# Patient Record
Sex: Male | Born: 1935 | Race: White | Hispanic: No | Marital: Married | Smoking: Never smoker
Health system: Southern US, Community
[De-identification: ages and names within clinical notes are randomized; demographics above are authoritative.]

## PROBLEM LIST (undated history)

## (undated) DIAGNOSIS — I10 Essential (primary) hypertension: Secondary | ICD-10-CM

## (undated) DIAGNOSIS — E785 Hyperlipidemia, unspecified: Secondary | ICD-10-CM

## (undated) DIAGNOSIS — I340 Nonrheumatic mitral (valve) insufficiency: Secondary | ICD-10-CM

## (undated) DIAGNOSIS — I2510 Atherosclerotic heart disease of native coronary artery without angina pectoris: Secondary | ICD-10-CM

## (undated) DIAGNOSIS — I639 Cerebral infarction, unspecified: Secondary | ICD-10-CM

## (undated) DIAGNOSIS — T7840XA Allergy, unspecified, initial encounter: Secondary | ICD-10-CM

## (undated) HISTORY — PX: APPENDECTOMY: SHX54

## (undated) HISTORY — PX: CORONARY ARTERY BYPASS GRAFT: SHX141

## (undated) HISTORY — DX: Allergy, unspecified, initial encounter: T78.40XA

## (undated) HISTORY — PX: SPINE SURGERY: SHX786

## (undated) HISTORY — DX: Cerebral infarction, unspecified: I63.9

## (undated) HISTORY — PX: BACK SURGERY: SHX140

## (undated) HISTORY — PX: OTHER SURGICAL HISTORY: SHX169

---

## 2005-05-12 ENCOUNTER — Encounter: Admission: RE | Admit: 2005-05-12 | Discharge: 2005-05-12 | Payer: Self-pay | Admitting: Orthopedic Surgery

## 2005-06-02 ENCOUNTER — Encounter: Admission: RE | Admit: 2005-06-02 | Discharge: 2005-06-02 | Payer: Self-pay | Admitting: Orthopedic Surgery

## 2005-07-10 ENCOUNTER — Ambulatory Visit: Payer: Self-pay | Admitting: Unknown Physician Specialty

## 2005-10-06 ENCOUNTER — Ambulatory Visit: Payer: Self-pay | Admitting: Pain Medicine

## 2006-02-10 ENCOUNTER — Inpatient Hospital Stay (HOSPITAL_COMMUNITY): Admission: RE | Admit: 2006-02-10 | Discharge: 2006-02-15 | Payer: Self-pay | Admitting: Neurosurgery

## 2006-02-11 ENCOUNTER — Ambulatory Visit: Payer: Self-pay | Admitting: Physical Medicine & Rehabilitation

## 2010-03-14 ENCOUNTER — Ambulatory Visit: Payer: Self-pay

## 2010-04-18 ENCOUNTER — Ambulatory Visit: Payer: Self-pay

## 2010-04-29 ENCOUNTER — Encounter: Admission: RE | Admit: 2010-04-29 | Discharge: 2010-04-29 | Payer: Self-pay | Admitting: Sports Medicine

## 2010-05-13 ENCOUNTER — Encounter: Admission: RE | Admit: 2010-05-13 | Discharge: 2010-05-13 | Payer: Self-pay | Admitting: Sports Medicine

## 2010-11-24 ENCOUNTER — Encounter: Payer: Self-pay | Admitting: Orthopedic Surgery

## 2011-03-21 NOTE — Discharge Summary (Signed)
Charles Tapia, Charles Tapia                 ACCOUNT NO.:  [id number]   MEDICAL RECORD NO.:  [id number]          PATIENT TYPE:  INP   LOCATION:  3039                         FACILITY:  [facility]   PHYSICIAN:  Hewitt Shorts, M.D.DATE OF BIRTH:  October 14, 1918   DATE OF ADMISSION:  02/10/2006  DATE OF DISCHARGE:  02/15/2006                                 DISCHARGE SUMMARY   The patient is admitted by Dr. Jeral Fruit for treatment of degenerative changes  in the low back.  Dr. Jeral Fruit notes an unremarkable general examination and  said he had a positive straight leg raising test.  Further details of his  admission history and physical examination were included in Dr. Cassandria Santee  admission note.   HOSPITAL COURSE:  The patient was admitted and underwent lumbar laminectomy  and decompression and posterolateral arthrodesis.  Following surgery, he has  had difficulty with urinary retention.  A Foley catheter was placed.  Dr.  Jeral Fruit obtained a consultation with Dr. Gaynelle Arabian who recommended leaving  the Foley in place, continuing him on Flomax and have him come in to Dr.  Earlene Plater' office in about five days for a voiding trial.  The patient's wound  itself has healed well and we are discharging the patient to home with  prescription for Percocet 1-2 tablets p.o. q.4-6h. p.r.n. pain, 50 tablets  prescribed with no refills and Flomax 0.4 mg daily, 30 tablets prescribed  with one refill.  He is to follow up with Dr. Jeral Fruit in about three weeks.  Follow up with Dr. Earlene Plater within four to five days.   DISCHARGE DIAGNOSES:  1.  Lumbar stenosis.  2.  Postoperative urinary retention.      Hewitt Shorts, M.D.  Electronically Signed     RWN/MEDQ  D:  02/15/2006  T:  02/16/2006  Job:  [id number]   cc:   Patient's chart

## 2011-03-21 NOTE — Op Note (Signed)
NAMELIVINGSTON, DENNER                 ACCOUNT NO.:  [id number]   MEDICAL RECORD NO.:  [id number]          PATIENT TYPE:  INP   LOCATION:  3039                         FACILITY:  [facility]   PHYSICIAN:  Hilda Lias, M.D.   DATE OF BIRTH:  Mar 31, 1918   DATE OF PROCEDURE:  02/10/2006  DATE OF DISCHARGE:                                 OPERATIVE REPORT   PREOPERATIVE DIAGNOSES:  1.  Lumbar stenosis L2-3, 3-4, 4-5, 5-1 with neurogenic claudication.  2.  Chronic radiculopathy.  3.  Scoliosis.   POSTOPERATIVE DIAGNOSES:  1.  Lumbar stenosis L2-3, 3-4, 4-5-1 with neurogenic claudication.  2.  Chronic radiculopathy.  3.  Scoliosis.   PROCEDURE:  1.  Bilateral 3, 4, 5, laminectomy.  2.  Decompression of the thecal sac.  3.  Foraminotomy.  4.  Posterolateral arthrodesis with a BMP and autograft.  5.  Microscope.   SURGEON:  Dr. Jeral Fruit   ASSISTANT:  Dr. Newell Coral   CLINICAL HISTORY:  The patient has been complaining of back pain rushing to  both legs up to the point thatwalking less than 200 feet he has to sit  because thepain goes to both legs.  After he sits, the pain goes away, he  continued to ambulate.  X-rays __________ degenerative disk disease with  stenosis from L2-3 down to L5-S1.  Surgery was advised, and risks were  explained, history and physical.   PROCEDURE:  The patient was taken to the OR.  He was position in a prone  matter.  The back was prepped with DuraPrep.  Midline incision from L2-3 was  made all the way down to L5-S1.  Muscle was retracted laterally.  We started  from the bottom up.  We removed the spinous process of 5, 4, 3, and part of  2.  The lamina was drilled using the high speed drill.  The patient had  quite a bit of osteoarthritis with thickening of the yellow ligament.  Decompression was achieved.  The worst part was at the level of 3-4 where he  had almost complete __________ between the dura mater and the yellow  ligament.  This part was really  calcified.  Using the microscope as well as  the 1 and 2 mm Kerrison punch, we were able to decompress the dura at this  level.  It was opened in the adenoid pouch which was taken care of with a  single stitch and Tisseel.  __________ We continued with decompression,  doing foraminotomy to decompress the L2, 3, 4, 5 and S1 nerve root  bilaterally.  Having achieved this, we went laterally, and we drilled the  lateral aspect of the facet from 2-3 down to L5-S1 as well as the ala of the  sacrum.  Also part of  the ___________ the process was also drilled.  Then a mix of autograft  and  BMP was used posterolateral to this.  The area was irrigated.  Valsalva  maneuver was negative.  From there on, the wound was closed with Vicryl and  Steri-Strips.  ______________________________  Hilda Lias, M.D.     EB/MEDQ  D:  02/10/2006  T:  02/10/2006  Job:  [id number]

## 2011-03-21 NOTE — H&P (Signed)
NAMELINC, RENNE                 ACCOUNT NO.:  [id number]   MEDICAL RECORD NO.:  [id number]          PATIENT TYPE:  INP   LOCATION:  NA                           FACILITY:  [facility]   PHYSICIAN:  Hilda Lias, M.D.   DATE OF BIRTH:  Mar 23, 1918   DATE OF ADMISSION:  02/10/2006  DATE OF DISCHARGE:                                HISTORY & PHYSICAL   This gentleman was seen by me several months ago with his family because of  back pain with radiation to both legs which is getting worse despite  physical therapy.  The patient has had conservative treatment including  epidural injection, medications.  He tells me that he walks less than a  block and then has to sit for pain to get worse.  He relates that although  the pain goes to both legs, the right worse than the left.   PAST MEDICAL HISTORY:  He has had bypass surgery.   ALLERGIES:  Novocain And penicillin.   SOCIAL HISTORY:  The patient does not smoke and does not drink.   FAMILY HISTORY:  Unremarkable.   REVIEW OF SYSTEMS:  Positive for swollen feet and back pain.   PHYSICAL EXAMINATION:  GENERAL:  The patient came to my office.  When we  walked from the waiting area to my office, he stumbled two times because he  was having quite a bit of pain down to both legs.  HEENT:  Normal.  NECK:  Normal.  LUNGS:  Clear.  HEART:  Sounds normal.  No murmur.  NEUROLOGY:  ___________.  The patient has straight leg raising.  SLR is  positive to 80 degrees.  He has ___________ Ephraim Hamburger which is  positive bilaterally.  He had weakness of dorsiflexion of both feet.   A lumbar MRI and the MR x-ray showed degenerative disk disease with  scoliosis.  The MRI showed that he has stenosis from L2 down to L5 as well.   IMPRESSION:  Neurogenic claudication with lumbar stenosis from L2-3 down to  L4-5.   RECOMMENDATIONS:  The patient came to my office with the family.  We talked  about what to do.  We continued talking about conservative  treatment, but  the patient feels that he wants to proceed with surgery.  The  procedure will be decompressing laminectomy from L3 down to L4-5 with  posterolateral arthrodesis using his own bone plus BMP.  The risks of  infection, CSF leak, worsening of pain, paralysis, need for further surgery  and monitoring, were explained.           ______________________________  Hilda Lias, M.D.     EB/MEDQ  D:  02/10/2006  T:  02/10/2006  Job:  [id number]

## 2011-03-21 NOTE — Consult Note (Signed)
Charles Tapia, Charles Tapia                 ACCOUNT NO.:  [id number]   MEDICAL RECORD NO.:  [id number]          PATIENT TYPE:  INP   LOCATION:  3039                         FACILITY:  [facility]   PHYSICIAN:  Lucrezia Starch. Earlene Plater, M.D.  DATE OF BIRTH:  Oct 31, 1918   DATE OF CONSULTATION:  02/13/2006  DATE OF DISCHARGE:                                   CONSULTATION   UROLOGIC CONSULTATION:   CHIEF COMPLAINT:  I can't urinate.   HISTORY OF PRESENT ILLNESS:  Charles Tapia is a very nice 77 year old white male  who is with his family.  He was noted to complain of severe back pain  rushing to both legs.  He could walk less than 200 feet.  He was  subsequently found to have severe degenerative joint disease with stenosis  from L2-3 down to L5-S1.  He notes that he had absolutely no difficulty with  urination previously and to his knowledge had not had any kind of prostate  problems or prostate surgery to that point.  He, however, has been on Flomax  previously for some obstructive flow.  Again, he notes some nocturia but not  any significant hesitancy or problems prior to the current procedure.  Since  surgery he has been unable to urinate.  He has required multiple  catheterizations, has failed multiple voiding trials despite being on  Flomax, and I have been asked to evaluate the patient regarding this.   PAST MEDICAL HISTORY:  He is allergic to PENICILLIN and NOVOCAIN.   Prior medications include Flomax, allopurinol, Caduet, Toprol XL, aspirin,  multivitamin, calcium, Miralax, Aleve and Norflex.   Past medical history:  He has had a coronary artery bypass in 1995.  He does  have hypertension.  He sees a cardiologist.  He has suffered gout.   SOCIAL HISTORY:  Negative smoker, negative drinker.   FAMILY HISTORY:  Not significant.   REVIEW OF SYSTEMS:  He has arthritis and joint pains and constipation.  He  has had nocturia x2-3 but notes he has decent flow on Flomax.  He has had no  blood in his urine,  no significant leakage, no significant intermittency.  He has also had no recent chest pain or GI complaints, diaphoresis or other  problems other than the back pain as noted above.   PHYSICAL EXAMINATION:  VITAL SIGNS:  Blood pressure 171/76, pulse 61,  respirations 16, temperature 97 degrees Fahrenheit.  GENERAL:  He is elderly, in no acute distress, oriented x3.  HEENT:  Head, eyes, ears, nose and throat normal.  NECK:  Without mass or thyromegaly.  CHEST:  Normalback and forth motion.  CARDIAC:  Normal sinus rhythm without murmurs or gallops.  ABDOMEN:  Soft, nontender, without mass or organomegaly or obvious hernias.  EXTREMITIES:  He has weak lower extremities and has the back incision.  NEUROLOGIC:  As above.  GENITOURINARY:  Penis, meatus, scrotum, testicles without lesions.   PERTINENT LABORATORY EVALUATION:  His BUN is 25, creatinine 1.2.   IMPRESSION:  Probable blood pressure, on Flomax preoperatively although only  symptoms are really nocturia, with significant  postop urinary retention I  suspect is multifactorial.  It probably relates to a number of issues,  including surgery with its associated pain, anesthesia, IV fluids,  medication and recumbency.   PLAN:  Maintain the Foley catheter and continue the Flomax.  Will plan to  see him in the outpatient setting in the office next week for a voiding  trial.  If he continues to have difficulty urinating, we may consider  urodynamics and intervention only following that.  I suspect as he stops his  medications and becomes more active and his rehab progresses, he will  probably reach his baseline of voiding function.      [name], M.D.  Electronically Signed     RLD/MEDQ  D:  02/13/2006  T:  02/14/2006  Job:  [id number]   cc:   Hilda Lias, M.D.  Fax: [phone]

## 2011-09-24 ENCOUNTER — Ambulatory Visit: Payer: Self-pay | Admitting: Orthopedic Surgery

## 2011-11-22 ENCOUNTER — Emergency Department (HOSPITAL_COMMUNITY): Payer: Medicare Other

## 2011-11-22 ENCOUNTER — Inpatient Hospital Stay (HOSPITAL_COMMUNITY)
Admission: EM | Admit: 2011-11-22 | Discharge: 2011-11-27 | DRG: 066 | Disposition: A | Discharge status: Rehabilitation Facility | Payer: Medicare Other | Attending: Neurology | Admitting: Neurology

## 2011-11-22 ENCOUNTER — Inpatient Hospital Stay (HOSPITAL_COMMUNITY): Payer: Medicare Other

## 2011-11-22 ENCOUNTER — Other Ambulatory Visit: Payer: Self-pay

## 2011-11-22 ENCOUNTER — Encounter (HOSPITAL_COMMUNITY): Payer: Self-pay | Admitting: *Deleted

## 2011-11-22 DIAGNOSIS — E785 Hyperlipidemia, unspecified: Secondary | ICD-10-CM | POA: Diagnosis present

## 2011-11-22 DIAGNOSIS — I059 Rheumatic mitral valve disease, unspecified: Secondary | ICD-10-CM | POA: Diagnosis present

## 2011-11-22 DIAGNOSIS — I10 Essential (primary) hypertension: Secondary | ICD-10-CM | POA: Diagnosis present

## 2011-11-22 DIAGNOSIS — I340 Nonrheumatic mitral (valve) insufficiency: Secondary | ICD-10-CM | POA: Insufficient documentation

## 2011-11-22 DIAGNOSIS — I619 Nontraumatic intracerebral hemorrhage, unspecified: Principal | ICD-10-CM

## 2011-11-22 DIAGNOSIS — I429 Cardiomyopathy, unspecified: Secondary | ICD-10-CM | POA: Insufficient documentation

## 2011-11-22 DIAGNOSIS — I2510 Atherosclerotic heart disease of native coronary artery without angina pectoris: Secondary | ICD-10-CM | POA: Diagnosis present

## 2011-11-22 DIAGNOSIS — Z951 Presence of aortocoronary bypass graft: Secondary | ICD-10-CM

## 2011-11-22 DIAGNOSIS — M109 Gout, unspecified: Secondary | ICD-10-CM | POA: Diagnosis present

## 2011-11-22 DIAGNOSIS — I169 Hypertensive crisis, unspecified: Secondary | ICD-10-CM

## 2011-11-22 HISTORY — DX: Nonrheumatic mitral (valve) insufficiency: I34.0

## 2011-11-22 HISTORY — DX: Essential (primary) hypertension: I10

## 2011-11-22 HISTORY — DX: Atherosclerotic heart disease of native coronary artery without angina pectoris: I25.10

## 2011-11-22 HISTORY — DX: Hyperlipidemia, unspecified: E78.5

## 2011-11-22 LAB — COMPREHENSIVE METABOLIC PANEL WITH GFR
ALT: 14 U/L (ref 0–53)
AST: 22 U/L (ref 0–37)
Albumin: 3.5 g/dL (ref 3.5–5.2)
Calcium: 9.6 mg/dL (ref 8.4–10.5)
Creatinine, Ser: 1.26 mg/dL (ref 0.50–1.35)
GFR calc Af Amer: 56 mL/min — ABNORMAL LOW (ref >90)
Potassium: 4.9 meq/L (ref 3.5–5.1)
Sodium: 137 meq/L (ref 135–145)
Total Protein: 6.2 g/dL (ref 6.0–8.3)

## 2011-11-22 LAB — DIFFERENTIAL
Basophils Absolute: 0 K/uL (ref 0.0–0.1)
Basophils Relative: 0 % (ref 0–1)
Eosinophils Absolute: 0.1 K/uL (ref 0.0–0.7)
Eosinophils Relative: 2 % (ref 0–5)
Lymphocytes Relative: 33 % (ref 12–46)
Lymphs Abs: 2.8 10*3/uL (ref 0.7–4.0)
Monocytes Absolute: 0.7 10*3/uL (ref 0.1–1.0)
Monocytes Relative: 8 % (ref 3–12)
Neutro Abs: 4.7 10*3/uL (ref 1.7–7.7)
Neutrophils Relative %: 57 % (ref 43–77)

## 2011-11-22 LAB — CBC
HCT: 35.4 % — ABNORMAL LOW (ref 39.0–52.0)
Hemoglobin: 12 g/dL — ABNORMAL LOW (ref 13.0–17.0)
MCH: 34.2 pg — ABNORMAL HIGH (ref 26.0–34.0)
MCHC: 33.9 g/dL (ref 30.0–36.0)
MCV: 100.9 fL — ABNORMAL HIGH (ref 78.0–100.0)
Platelets: 161 10*3/uL (ref 150–400)
RBC: 3.51 MIL/uL — ABNORMAL LOW (ref 4.22–5.81)
RDW: 13.7 % (ref 11.5–15.5)
WBC: 8.4 10*3/uL (ref 4.0–10.5)

## 2011-11-22 LAB — PROTIME-INR
INR: 4.62 — ABNORMAL HIGH (ref 0.00–1.49)
INR: 1.05 (ref 0.00–1.49)
Prothrombin Time: 44.3 seconds — ABNORMAL HIGH (ref 11.6–15.2)
Prothrombin Time: 13.9 seconds (ref 11.6–15.2)

## 2011-11-22 LAB — COMPREHENSIVE METABOLIC PANEL
Alkaline Phosphatase: 73 U/L (ref 39–117)
BUN: 34 mg/dL — ABNORMAL HIGH (ref 6–23)
CO2: 23 mEq/L (ref 19–32)
Chloride: 104 mEq/L (ref 96–112)
GFR calc non Af Amer: 48 mL/min — ABNORMAL LOW (ref >90)
Glucose, Bld: 106 mg/dL — ABNORMAL HIGH (ref 70–99)
Total Bilirubin: 0.5 mg/dL (ref 0.3–1.2)

## 2011-11-22 LAB — TROPONIN I: Troponin I: <0.3 ng/mL (ref <0.30)

## 2011-11-22 LAB — APTT: aPTT: >200 seconds (ref 24–37)

## 2011-11-22 LAB — CK TOTAL AND CKMB (NOT AT ARMC)
CK, MB: 4.5 ng/mL — ABNORMAL HIGH (ref 0.3–4.0)
Relative Index: INVALID (ref 0.0–2.5)
Total CK: 89 U/L (ref 7–232)

## 2011-11-22 LAB — GLUCOSE, CAPILLARY: Glucose-Capillary: 112 mg/dL — ABNORMAL HIGH (ref 70–99)

## 2011-11-22 MED ORDER — ALLOPURINOL 300 MG PO TABS
300.0000 mg | ORAL_TABLET | Freq: Every day | ORAL | Status: DC
  Administered 2011-11-22: 150 mg via ORAL
  Administered 2011-11-23 – 2011-11-27 (×5): 300 mg via ORAL
  Filled 2011-11-22 (×6): qty 1

## 2011-11-22 MED ORDER — SIMVASTATIN 20 MG PO TABS
20.0000 mg | ORAL_TABLET | Freq: Every day | ORAL | Status: DC
  Administered 2011-11-22 – 2011-11-27 (×6): 20 mg via ORAL
  Filled 2011-11-22 (×6): qty 1

## 2011-11-22 MED ORDER — CALCIUM CARBONATE 600 MG PO TABS
600.0000 mg | ORAL_TABLET | Freq: Every day | ORAL | Status: DC
  Filled 2011-11-22: qty 1

## 2011-11-22 MED ORDER — ENALAPRIL MALEATE 10 MG PO TABS
10.0000 mg | ORAL_TABLET | Freq: Every day | ORAL | Status: DC
  Administered 2011-11-22 – 2011-11-23 (×2): 10 mg via ORAL
  Filled 2011-11-22 (×2): qty 1

## 2011-11-22 MED ORDER — ACETAMINOPHEN 325 MG PO TABS
650.0000 mg | ORAL_TABLET | ORAL | Status: DC | PRN
  Administered 2011-11-27 (×2): 650 mg via ORAL
  Filled 2011-11-22 (×3): qty 2

## 2011-11-22 MED ORDER — CALCIUM CARBONATE 1250 (500 CA) MG PO TABS
1.0000 | ORAL_TABLET | Freq: Every day | ORAL | Status: DC
  Administered 2011-11-22 – 2011-11-27 (×6): 500 mg via ORAL
  Filled 2011-11-22 (×6): qty 1

## 2011-11-22 MED ORDER — SENNOSIDES-DOCUSATE SODIUM 8.6-50 MG PO TABS
1.0000 | ORAL_TABLET | Freq: Two times a day (BID) | ORAL | Status: DC
  Administered 2011-11-22 – 2011-11-27 (×8): 1 via ORAL
  Filled 2011-11-22 (×11): qty 1

## 2011-11-22 MED ORDER — NICARDIPINE HCL IN NACL 20-0.86 MG/200ML-% IV SOLN
5.0000 mg/h | INTRAVENOUS | Status: AC
  Administered 2011-11-22: 5 mg/h via INTRAVENOUS
  Filled 2011-11-22: qty 200

## 2011-11-22 MED ORDER — METOPROLOL SUCCINATE ER 50 MG PO TB24
50.0000 mg | ORAL_TABLET | Freq: Every day | ORAL | Status: DC
  Administered 2011-11-22 – 2011-11-27 (×6): 50 mg via ORAL
  Filled 2011-11-22 (×6): qty 1

## 2011-11-22 MED ORDER — OMEGA-3-ACID ETHYL ESTERS 1 G PO CAPS
1.0000 g | ORAL_CAPSULE | Freq: Two times a day (BID) | ORAL | Status: DC
  Administered 2011-11-22 – 2011-11-27 (×11): 1 g via ORAL
  Filled 2011-11-22 (×13): qty 1

## 2011-11-22 MED ORDER — NICARDIPINE HCL IN NACL 20-0.86 MG/200ML-% IV SOLN
3.0000 mg/h | INTRAVENOUS | Status: DC
  Filled 2011-11-22 (×2): qty 200

## 2011-11-22 MED ORDER — OMEGA-3 FATTY ACIDS 1000 MG PO CAPS: 2.0000 g | ORAL_CAPSULE | Freq: Every day | ORAL | Status: DC

## 2011-11-22 MED ORDER — TAMSULOSIN HCL 0.4 MG PO CAPS
0.4000 mg | ORAL_CAPSULE | Freq: Every day | ORAL | Status: DC
  Administered 2011-11-22 – 2011-11-27 (×6): 0.4 mg via ORAL
  Filled 2011-11-22 (×6): qty 1

## 2011-11-22 MED ORDER — PANTOPRAZOLE SODIUM 40 MG IV SOLR
40.0000 mg | Freq: Every day | INTRAVENOUS | Status: DC
  Administered 2011-11-22: 40 mg via INTRAVENOUS
  Filled 2011-11-22 (×2): qty 40

## 2011-11-22 MED ORDER — POLYETHYLENE GLYCOL 3350 17 G PO PACK
17.0000 g | PACK | Freq: Every day | ORAL | Status: DC
  Administered 2011-11-22 – 2011-11-27 (×6): 17 g via ORAL
  Filled 2011-11-22 (×6): qty 1

## 2011-11-22 MED ORDER — ACETAMINOPHEN 650 MG RE SUPP: 650.0000 mg | RECTAL | Status: DC | PRN

## 2011-11-22 MED ORDER — ONDANSETRON HCL 4 MG/2ML IJ SOLN: 4.0000 mg | Freq: Four times a day (QID) | INTRAMUSCULAR | Status: DC | PRN

## 2011-11-22 MED ORDER — TRAMADOL HCL 50 MG PO TABS
50.0000 mg | ORAL_TABLET | Freq: Four times a day (QID) | ORAL | Status: DC | PRN
  Administered 2011-11-22 – 2011-11-27 (×3): 50 mg via ORAL
  Filled 2011-11-22 (×2): qty 2

## 2011-11-22 NOTE — ED Notes (Signed)
Pt taken to ct with RN at side.

## 2011-11-22 NOTE — H&P (Signed)
Admission H&P    Chief Complaint: Unable to control the left side HPI: Charles Tapia is an 78 y.o. male who reports that when he went to bed he was feeling at his baseline.  He awakened in the middle of the night to go to the bathroom and noted that he was unable to control his left side.  He did not report any numbness on the left.  Initial NIHSS of 4.  LSN: 2030 tPA Given: No: ICH mRankin: 1  Past Medical History  Diagnosis Date  . Hypertension   . Cardiomyopathy   . CAD (coronary artery disease)   . Gout   . Hyperlipidemia   . Mitral regurgitation     Past Surgical History  Procedure Date  . Coronary bypass graft stenosis     Family History  Problem Relation Age of Onset  . Diabetes Son    Social History:  reports that he has never smoked. He has never used smokeless tobacco. He reports that he does not drink alcohol. His drug history not on file.  Allergies:  Allergies  Allergen Reactions  . Penicillins Hives and Rash    Medications Prior to Admission  Medication Dose Route Frequency Provider Last Rate Last Dose  . niCARdipine (CARDENE-IV) infusion (0.1 mg/ml)  5 mg/hr Intravenous To Major Gavin Pound. Ghim, MD       No current outpatient prescriptions on file as of 11/22/2011.    ROS: History obtained from the patient  General ROS: negative for - chills, fatigue, fever, night sweats, weight gain or weight loss Psychological ROS: negative for - behavioral disorder, hallucinations, memory difficulties, mood swings or suicidal ideation Ophthalmic ROS: wears glasses ENT ROS: negative for - epistaxis, nasal discharge, oral lesions, sore throat, tinnitus or vertigo Allergy and Immunology ROS: negative for - hives or itchy/watery eyes Hematological and Lymphatic ROS: negative for - bleeding problems, bruising or swollen lymph nodes Endocrine ROS: negative for - galactorrhea, hair pattern changes, polydipsia/polyuria or temperature intolerance Respiratory ROS: negative  for - cough, hemoptysis, shortness of breath or wheezing Cardiovascular ROS: negative for - chest pain, dyspnea on exertion, edema or irregular heartbeat Gastrointestinal ROS: negative for - abdominal pain, diarrhea, hematemesis, nausea/vomiting or stool incontinence Genito-Urinary ROS: negative for - dysuria, hematuria, incontinence or urinary frequency/urgency Musculoskeletal ROS: right leg pain, back pain Neurological ROS: as noted in HPI Dermatological ROS: negative for rash and skin lesion changes  Physical Examination: Blood pressure 191/51, pulse 58, temperature 98.1 F (36.7 C), temperature source Oral, resp. rate 18, height 5\' 8"  (1.727 m), weight 65.772 kg (145 lb), SpO2 99.00%.  HEENT-  Normocephalic, no lesions, without obvious abnormality.  Normal external eye and conjunctiva.  Normal TM's bilaterally.  Normal auditory canals and external ears. Normal external nose, mucus membranes and septum.  Normal pharynx. Neck supple with no masses, nodes, nodules or enlargement. Cardiovascular - S1, S2 normal Lungs - chest clear, no wheezing, rales, normal symmetric air entry Abdomen - soft, non-tender; bowel sounds normal; no masses,  no organomegaly Extremities - no edema  Neurologic Examination: Mental Status: Alert, oriented, thought content appropriate.  Speech fluent without evidence of aphasia.  Able to follow 3 step commands without difficulty. Cranial Nerves: II: visual fields grossly normal, pupils equal, round, reactive to light and accommodation III,IV, VI: ptosis not present, extra-ocular motions intact bilaterally V,VII: decrease in left NLF, facial light touch sensation normal bilaterally VIII: hearing normal bilaterally IX,X: gag reflex present XI: trapezius strength/neck flexion strength normal bilaterally XII: tongue  strength normal  Motor: Right : Upper extremity   5/5    Left:     Upper extremity   4/5  Lower extremity   5/5     Lower extremity   5-/5 Tone and  bulk:normal tone throughout; no atrophy noted Sensory: Pinprick and light touch intact throughout, bilaterally Deep Tendon Reflexes: 2+ and symmetric in the upper extremities, 1+ left knee jerk and absent DTR's otherwise Plantars: Right: mute   Left: upgoing Cerebellar: normal finger-to-nose on the right and normal heel-to-shin test bilaterally.  Unable to perform finger to nose on the left secondary to weakness   Results for orders placed during the hospital encounter of 11/22/11 (from the past 48 hour(s))  CBC     Status: Abnormal   Collection Time   11/22/11  4:00 AM      Component Value Range Comment   WBC 8.4  4.0 - 10.5 (K/uL)    RBC 3.51 (*) 4.22 - 5.81 (MIL/uL)    Hemoglobin 12.0 (*) 13.0 - 17.0 (g/dL)    HCT 60.4 (*) 54.0 - 52.0 (%)    MCV 100.9 (*) 78.0 - 100.0 (fL)    MCH 34.2 (*) 26.0 - 34.0 (pg)    MCHC 33.9  30.0 - 36.0 (g/dL)    RDW 98.1  19.1 - 47.8 (%)    Platelets 161  150 - 400 (K/uL)   DIFFERENTIAL     Status: Normal   Collection Time   11/22/11  4:00 AM      Component Value Range Comment   Neutrophils Relative 57  43 - 77 (%)    Neutro Abs 4.7  1.7 - 7.7 (K/uL)    Lymphocytes Relative 33  12 - 46 (%)    Lymphs Abs 2.8  0.7 - 4.0 (K/uL)    Monocytes Relative 8  3 - 12 (%)    Monocytes Absolute 0.7  0.1 - 1.0 (K/uL)    Eosinophils Relative 2  0 - 5 (%)    Eosinophils Absolute 0.1  0.0 - 0.7 (K/uL)    Basophils Relative 0  0 - 1 (%)    Basophils Absolute 0.0  0.0 - 0.1 (K/uL)    Ct Head Wo Contrast  11/22/2011  *RADIOLOGY REPORT*  Clinical Data: Right-sided weakness.  Code stroke.  CT HEAD WITHOUT CONTRAST  Technique:  Contiguous axial images were obtained from the base of the skull through the vertex without contrast.  Comparison: None.  Findings: There is an 11 mm area of acute hemorrhage within the right periventricular white matter.  No mass effect midline shift. No hydrocephalus.  Chronic small vessel disease changes throughout the deep white matter.  No  acute ischemic hemorrhage.  No extra- axial fluid collection.  Visualized paranasal sinuses and mastoids clear.  Orbital soft tissues unremarkable.  IMPRESSION: Small acute hemorrhage within the right periventricular white matter.  Critical Value/emergent results were called by telephone at the time of interpretation on 11/22/2011  at 0418  to  Dr. Oletta Lamas, who verbally acknowledged these results.  Original Report Authenticated By: Cyndie Chime, M.D.    Assessment: 78 y.o. male presenting with left sided weakness.   Imaging shows a right periventricular hemorrhage.  No midline shift or mass effect noted.  Patient to be admitted for further evaluation and work up.  BP to be controlled.  Stroke Risk Factors - hyperlipidemia, hypertension and CAD  Plan: 1. HgbA1c, fasting lipid panel 2. MRI, MRA  of the brain without contrast 3.  PT consult, OT consult, Speech consult 4. Echocardiogram 5. Carotid dopplers 6. No anticoagulation therapy at this time.  ASA to be discontinued.   7. Cardene started for BP control 7. Telemetry monitoring 8.  Repeat head CT in 24 hours.   Thana Farr, MD [facility] [phone] 11/22/2011, 4:47 AM

## 2011-11-22 NOTE — ED Provider Notes (Addendum)
History     CSN: [id number]  Arrival date & time 11/22/11  0400   First MD Initiated Contact with Patient 11/22/11 0411      Chief Complaint  Patient presents with  . Code Stroke    (Consider location/radiation/quality/duration/timing/severity/associated sxs/prior treatment) HPI Comments: Patient reports that he woke up around 3 this morning feeling unusual. He realized that his left arm felt unusual, none in heavy. He reports when he tried to pick it up it kept falling back down. He denies slurred speech, facial droop or weakness to his legs however. He denies prior history of stroke or TIA. He does note that he has had problems with his blood pressure and his regular doctors have been adjusting his medications. He does have a history of cardiac disease. Patient called EMS, and the patient was brought here to the emergency department after they alerted [place] to a possible code stroke. Patient reports he went to bed a little before 9 PM and was feeling normal at that time. Patient denies a headache, blurred vision, nausea or vomiting. He also denies any chest or back pain. Level 5 caveat due to urgent need for intervention.    The history is provided by the patient.    Past Medical History  Diagnosis Date  . Hypertension   . Cardiomyopathy   . CAD (coronary artery disease)   . Gout   . Hyperlipidemia   . Mitral regurgitation     Past Surgical History  Procedure Date  . Coronary bypass graft stenosis     No family history on file.  History  Substance Use Topics  . Smoking status: Not on file  . Smokeless tobacco: Not on file  . Alcohol Use:       Review of Systems  Unable to perform ROS: Other  Neurological: Positive for weakness. Negative for headaches.    Allergies  Penicillins  Home Medications   Current Outpatient Rx  Name Route Sig Dispense Refill  . ALLOPURINOL 300 MG PO TABS Oral Take 300 mg by mouth daily.    . ASPIRIN EC 81 MG PO TBEC Oral Take 81 mg by  mouth daily.    Marland Kitchen CALCIUM CARBONATE 600 MG PO TABS Oral Take 600 mg by mouth daily.    . ENALAPRIL MALEATE 10 MG PO TABS Oral Take 10 mg by mouth daily.    . OMEGA-3 FATTY ACIDS 1000 MG PO CAPS Oral Take 2 g by mouth daily.    Marland Kitchen METOPROLOL SUCCINATE ER 50 MG PO TB24 Oral Take 50 mg by mouth daily.    Marland Kitchen POLYETHYLENE GLYCOL 3350 PO PACK Oral Take 17 g by mouth daily.    Marland Kitchen SIMVASTATIN 20 MG PO TABS Oral Take 20 mg by mouth daily.    Marland Kitchen TAMSULOSIN HCL 0.4 MG PO CAPS Oral Take 0.4 mg by mouth daily.      BP 191/51  Pulse 58  Temp(Src) 98.1 F (36.7 C) (Oral)  Resp 18  Ht 5\' 8"  (1.727 m)  Wt 145 lb (65.772 kg)  BMI 22.05 kg/m2  SpO2 99%  Physical Exam  Nursing note and vitals reviewed. Constitutional: He appears well-developed and well-nourished. No distress.  HENT:  Head: Normocephalic and atraumatic.  Mouth/Throat: Uvula is midline. Mucous membranes are dry.  Eyes: Conjunctivae are normal. Pupils are equal, round, and reactive to light.  Neck: Normal range of motion. Neck supple.  Cardiovascular: Normal rate.   No murmur heard. Pulmonary/Chest: Effort normal and breath sounds normal.  Abdominal: Soft. He exhibits no distension. There is no tenderness.  Neurological: He is alert. No cranial nerve deficit or sensory deficit. GCS eye subscore is 4. GCS verbal subscore is 5. GCS motor subscore is 6.       Patient has positive arm drift on the left side. Patient has mild weakness with right shoulder abduction as well, however the patient reports that he has some chronic pain due to old injury.  Skin: Skin is warm, dry and intact. No rash noted. He is not diaphoretic. No pallor.    ED Course  Procedures (including critical care time)  CRITICAL CARE Performed by: Lear Ng.   Total critical care time: 30 min  Critical care time was exclusive of separately billable procedures and treating other patients.  Critical care was necessary to treat or prevent imminent or  life-threatening deterioration.  Critical care was time spent personally by me on the following activities: development of treatment plan with patient and/or surrogate as well as nursing, discussions with consultants, evaluation of patient's response to treatment, examination of patient, obtaining history from patient or surrogate, ordering and performing treatments and interventions, ordering and review of laboratory studies, ordering and review of radiographic studies, pulse oximetry and re-evaluation of patient's condition.   Labs Reviewed  PROTIME-INR  APTT  CBC  DIFFERENTIAL  COMPREHENSIVE METABOLIC PANEL  CK TOTAL AND CKMB  TROPONIN I  POCT CBG MONITORING   Ct Head Wo Contrast  11/22/2011  *RADIOLOGY REPORT*  Clinical Data: Right-sided weakness.  Code stroke.  CT HEAD WITHOUT CONTRAST  Technique:  Contiguous axial images were obtained from the base of the skull through the vertex without contrast.  Comparison: None.  Findings: There is an 11 mm area of acute hemorrhage within the right periventricular white matter.  No mass effect midline shift. No hydrocephalus.  Chronic small vessel disease changes throughout the deep white matter.  No acute ischemic hemorrhage.  No extra- axial fluid collection.  Visualized paranasal sinuses and mastoids clear.  Orbital soft tissues unremarkable.  IMPRESSION: Small acute hemorrhage within the right periventricular white matter.  Critical Value/emergent results were called by telephone at the time of interpretation on 11/22/2011  at 0418  to  Dr. Oletta Lamas, who verbally acknowledged these results.  Original Report Authenticated By: Cyndie Chime, M.D.   I reviewed the above CT scan myself.  1. Hemorrhagic stroke   2. Hypertensive crisis       MDM   Since the patient awoke with his symptoms, patient is not a TPA candidate. Dr. Kearney Hard, with radiology did call me an alert me to the fact that he had a small intracranial hemorrhage on head CT on the right  side. This is consistent with his symptoms causing left arm numbness and weakness. His current blood pressure is elevated at 190/50. After close discussion with Dr. Thad Ranger who is the on-call neurologist, decision made to start him on IV nicardipine for emergent blood pressure control for hypertensive emergency. Asians diagnosis is a hemorrhagic stroke. Patient will be admitted to the hospital by Dr. Thad Ranger and the neurology service.  At this time, his mentation seems to be appropriate, and there is no impending airway concerns. I've also informed the patient's 2 sons who accompany the patient here.        Gavin Pound. Lunette Tapp, MD 11/22/11 0443    EKG at time 04:31, shows sinus bradycardia at a rate of 57. PR is borderline delayed at 219 ms. There is criteria for left  ventricular hypertrophy present. QRS duration is mildly prolonged at 110 ms. There is left axis deviation and has evidence of left anterior fascicular block. EKG is similar in appearance to that from 02/09/2006. Criteria for left anterior fascicular block is now seen  Gavin Pound. Oletta Lamas, MD 11/22/11 [phone]

## 2011-11-22 NOTE — Progress Notes (Signed)
Subjective:  Charles Tapia is an 78 y.o. male who reports that when he went to bed he was feeling at his baseline. He awakened in the middle of the night to go to the bathroom and noted that he was unable to control his left side. He did not report any numbness on the left. Initial NIHSS of 4.  He has remained stable overnight with blood pressure controlled on Cardene drip. He still has some slurred speech and left-sided weakness mainly in the arm.his grandson is at the bedside     Objective: Vital signs in last 24 hours: Temp:  [97.5 F (36.4 C)-98.1 F (36.7 C)] 97.5 F (36.4 C) (01/19 0834) Pulse Rate:  [52-71] 61  (01/19 0830) Resp:  [13-20] 14  (01/19 0830) BP: (129-193)/(42-153) 131/48 mmHg (01/19 0830) SpO2:  [95 %-99 %] 97 % (01/19 0830) Weight:  [65.772 kg (145 lb)] 65.772 kg (145 lb) (01/19 0420) Weight change:     Intake/Output from previous day: 01/18 0701 - 01/19 0700 In: 58.3 [I.V.:58.3] Out: 200 [Urine:200] Intake/Output this shift: Total I/O In: 25 [I.V.:25] Out: -   Physical exam awake alert.Afebrile. Head is nontraumatic. Neck is supple without carotid bruit. Hearing is slightly decreased bilaterally. Cardiac exam ejection murmur no gallop. Lungs clear to auscultation. Abdomen soft nontender. Neurological exam awake, alert, oriented x3 with normal speech and language function. Mild dysarthria but can be understood easily. Extraocular movements are full range without nystagmus. Visual fields are full to confrontational testing. There is mild left lower facial weakness. Tongue is midline. Motor system exam reveals left upper extremity drift with the hand hitting the bed. There is grade 2-3 strength in the left upper extremity and 4+/5 strength in the left lower extremity. Is significant weakness of left grip and hand muscles. Sensation appears intact. Left is upgoing right is downgoing. Gait was not tested.  NIH stroke scale  4. Lab Results:  Basename 11/22/11 0400    WBC 8.4  HGB 12.0*  HCT 35.4*  PLT 161   BMET  Basename 11/22/11 0400  NA 137  K 4.9  CL 104  CO2 23  GLUCOSE 106*  BUN 34*  CREATININE 1.26  CALCIUM 9.6    Studies/Results: Ct Head Wo Contrast  11/22/2011  *RADIOLOGY REPORT*  Clinical Data: Right-sided weakness.  Code stroke.  CT HEAD WITHOUT CONTRAST  Technique:  Contiguous axial images were obtained from the base of the skull through the vertex without contrast.  Comparison: None.  Findings: There is an 11 mm area of acute hemorrhage within the right periventricular white matter.  No mass effect midline shift. No hydrocephalus.  Chronic small vessel disease changes throughout the deep white matter.  No acute ischemic hemorrhage.  No extra- axial fluid collection.  Visualized paranasal sinuses and mastoids clear.  Orbital soft tissues unremarkable.  IMPRESSION: Small acute hemorrhage within the right periventricular white matter.  Critical Value/emergent results were called by telephone at the time of interpretation on 11/22/2011  at 0418  to  Dr. Oletta Lamas, who verbally acknowledged these results.  Original Report Authenticated By: Cyndie Chime, M.D.     MRI examination of the brain- ordered     Medications: I have reviewed the patient's current medications.  Assessment/Plan:  78 y.o. male presenting with left sided weakness. Imaging shows a 1 cm right basal ganglia hemorrhage. No midline shift, hydrocephalous or mass effect noted.    . BP to be controlled on cardene drip.  Plan  Continue strict blood pressure  control use Cardene drip as well as start his home blood pressure medications. Repeat CT scan of the head today an MRI scan of the brain later. Check echocardiogram and carotid Dopplers. Physical occupational and speech therapy consults. Mobilize out of bed. I had a long discussion with the patient and his grandson regarding its presentation, prognosis, plan for evaluation, treatment and answer questions.This patient is  critically ill and at significant risk of neurological worsening, death and care requires constant monitoring of vital signs, hemodynamics,respiratory and cardiac monitoring, neurological assessment, discussion with family, other specialists and medical decision making of high complexity. I spent 30 minutes of neurocritical care time  in the care of  this patient.   LOS: 0 days   SETHI,PRAMODKUMAR P 11/22/2011, 9:20 AM

## 2011-11-22 NOTE — Code Documentation (Signed)
Responded to Code Stroke page to Emergency Dept. Patient arrived to dept @ 0400, after EDP exam patient taken to CT scan for head CT. Upon arrival back to room, Dr Thad Ranger present at bedside to perform further neurological evaluation. Dr Oletta Lamas notified by Radiologist of results of CT. NIHHS 4; patient alert and oriented, speech slurred, weakness of left arm and leg noted as well as a slight left facial droop. Patient's sons present at bedside and provided with an update per physician re: results of CT/current plan of care.

## 2011-11-22 NOTE — ED Notes (Signed)
Dr. Reynolds at bedside.

## 2011-11-22 NOTE — Progress Notes (Signed)
Late Entry  Pt to CT at 1300

## 2011-11-22 NOTE — ED Notes (Addendum)
Pt awoke at 0230 and felt like his L arm was heavy.  He had been working with his pcp to lower his bp for the past 2 weeks.  Last week him pcp increased his bp medications.  Per [facility], pt bp of 123/95 upon arrival and decreased to 193/80 enroute, cbg of 99.  Pt AO X 4.

## 2011-11-22 NOTE — ED Notes (Signed)
Code stroke called at 0344, stroke team arrival 74, patient arrived 40, EDP exam 0401, patient in CT 0404, CT read 72.

## 2011-11-23 ENCOUNTER — Inpatient Hospital Stay (HOSPITAL_COMMUNITY): Payer: Medicare Other

## 2011-11-23 DIAGNOSIS — I517 Cardiomegaly: Secondary | ICD-10-CM

## 2011-11-23 MED ORDER — ENALAPRIL MALEATE 20 MG PO TABS
20.0000 mg | ORAL_TABLET | Freq: Every day | ORAL | Status: DC
  Administered 2011-11-24 – 2011-11-27 (×4): 20 mg via ORAL
  Filled 2011-11-23 (×4): qty 1

## 2011-11-23 MED ORDER — LABETALOL HCL 5 MG/ML IV SOLN
5.0000 mg | INTRAVENOUS | Status: DC | PRN
  Administered 2011-11-23 – 2011-11-26 (×5): 5 mg via INTRAVENOUS
  Filled 2011-11-23 (×3): qty 4

## 2011-11-23 MED ORDER — PANTOPRAZOLE SODIUM 40 MG PO TBEC
40.0000 mg | DELAYED_RELEASE_TABLET | Freq: Every day | ORAL | Status: DC
  Administered 2011-11-24 – 2011-11-27 (×4): 40 mg via ORAL
  Filled 2011-11-23 (×5): qty 1

## 2011-11-23 MED ORDER — ENALAPRIL MALEATE 10 MG PO TABS
10.0000 mg | ORAL_TABLET | Freq: Once | ORAL | Status: AC
  Administered 2011-11-23: 10 mg via ORAL
  Filled 2011-11-23: qty 1

## 2011-11-23 NOTE — Progress Notes (Signed)
*  PRELIMINARY RESULTS*   Carotid Dopplers completed.  No significant ICA stenosis.  Vertebral artery flow is antegrade.   Sherren Kerns Renee 11/23/2011, 10:42 AM

## 2011-11-23 NOTE — Progress Notes (Signed)
  Echocardiogram 2D Echocardiogram has been performed.  Jeryl Columbia R 11/23/2011, 11:30 AM

## 2011-11-23 NOTE — Progress Notes (Signed)
Subjective:  Charles Tapia is an 78 y.o. male who reports that when he went to bed he was feeling at his baseline. He awakened in the middle of the night to go to the bathroom and noted that he was unable to control his left side. He did not report any numbness on the left. Initial NIHSS of 4.  He has remained stable overnight with blood pressure controlled off Cardene drip. He still has some slurred speech and left-sided weakness mainly in the arm.his  son is at the bedside.he had MRI scan yesterday which showed stable appearance of the right basal ganglia hemorrhage as well a CT scan this morning which is also stable.     Objective: Vital signs in last 24 hours: Temp:  [97.4 F (36.3 C)-98.9 F (37.2 C)] 97.4 F (36.3 C) (01/20 0800) Pulse Rate:  [55-82] 72  (01/20 0900) Resp:  [12-19] 19  (01/20 0900) BP: (104-170)/(37-70) 160/60 mmHg (01/20 0900) SpO2:  [96 %-98 %] 98 % (01/20 0900) Weight change:  Last BM Date: 11/22/11  Intake/Output from previous day: 01/19 0701 - 01/20 0700 In: 640.8 [P.O.:360; I.V.:270.8; IV Piggyback:10] Out: 1725 [Urine:1725] Intake/Output this shift: Total I/O In: 240 [P.O.:240] Out: 150 [Urine:150]  Physical exam awake alert.Afebrile. Head is nontraumatic. Neck is supple without carotid bruit. Hearing is slightly decreased bilaterally. Cardiac exam ejection murmur no gallop. Lungs clear to auscultation. Abdomen soft nontender. Neurological exam awake, alert, oriented x3 with normal speech and language function. Mild dysarthria but can be understood easily. Extraocular movements are full range without nystagmus. Visual fields are full to confrontational testing. There is mild left lower facial weakness. Tongue is midline. Motor system exam reveals left upper extremity drift with the hand hitting the bed. There is grade 2-3 strength in the left upper extremity and 4+/5 strength in the left lower extremity.mild weakness of left grip and hand muscles. Sensation  appears intact. Left is upgoing right is downgoing. Gait was not tested.   . Lab Results:  Basename 11/22/11 0400  WBC 8.4  HGB 12.0*  HCT 35.4*  PLT 161   BMET  Basename 11/22/11 0400  NA 137  K 4.9  CL 104  CO2 23  GLUCOSE 106*  BUN 34*  CREATININE 1.26  CALCIUM 9.6    Studies/Results: Ct Head Wo Contrast  11/23/2011  *RADIOLOGY REPORT*  Clinical Data: Intracerebral hemorrhage.  CT HEAD WITHOUT CONTRAST  Technique:  Contiguous axial images were obtained from the base of the skull through the vertex without contrast.  Comparison: 11/22/2011  Findings: Again noted is the small area of hemorrhage within the right basal ganglia, measuring 11 mm maximally.  This is unchanged. No new areas of hemorrhage.  Atrophy and chronic small vessel disease throughout the deep white matter.  No hydrocephalus, mass effect midline shift.  IMPRESSION: Stable small area of hemorrhage in the right basal ganglia.  Original Report Authenticated By: Cyndie Chime, M.D.   Ct Head Wo Contrast  11/22/2011  *RADIOLOGY REPORT*  Clinical Data: Right-sided weakness.  Code stroke.  CT HEAD WITHOUT CONTRAST  Technique:  Contiguous axial images were obtained from the base of the skull through the vertex without contrast.  Comparison: None.  Findings: There is an 11 mm area of acute hemorrhage within the right periventricular white matter.  No mass effect midline shift. No hydrocephalus.  Chronic small vessel disease changes throughout the deep white matter.  No acute ischemic hemorrhage.  No extra- axial fluid collection.  Visualized paranasal sinuses  and mastoids clear.  Orbital soft tissues unremarkable.  IMPRESSION: Small acute hemorrhage within the right periventricular white matter.  Critical Value/emergent results were called by telephone at the time of interpretation on 11/22/2011  at 0418  to  Dr. Oletta Lamas, who verbally acknowledged these results.  Original Report Authenticated By: Cyndie Chime, M.D.   Mr Brain  Wo Contrast  11/22/2011  *RADIOLOGY REPORT*  Clinical Data: Follow-up known intracranial hemorrhage.  The patient awoke unable to control the left side.  The patient has a history of hypertension, cardiomyopathy, and coronary artery disease.  MRI HEAD WITHOUT CONTRAST  Technique:  Multiplanar, multiecho pulse sequences of the brain and surrounding structures were obtained according to standard protocol without intravenous contrast.  Comparison: CT head 11/22/2011  Findings: 9 x 11 x 14 mm right posterior putaminal intracerebral hemorrhage with mild surrounding edema is redemonstrated.  The total volume of this hematoma calculates to approximately 0.7 ml.  There is no visible vascular malformation.  No intracranial aneurysm is seen.  There is no acute infarction  although areas of mild restricted diffusion surround the hematoma, and could reflect relative ischemia.  There is advanced atrophy with chronic microvascular ischemic change. There are no other significant foci of chronic hemorrhage observed.  There is no midline shift.  Mild chronic sinus disease is present.  Empty sella is noted. Moderate pannus surrounds the odontoid.  Compared with prior CT, there is no significant change.  IMPRESSION: 9 x 11 x 14 mm right posterior putaminal intracerebral hemorrhage with mild surrounding edema. No significant mass effect or midline shift.  Atrophy with chronic microvascular ischemic change.  Original Report Authenticated By: Elsie Stain, M.D.         Medications: I have reviewed the patient's current medications.  Assessment/Plan:  78 y.o. male presenting with left sided weakness. Imaging shows a 1 cm right basal ganglia hemorrhage. No midline shift, hydrocephalous or mass effect noted.    . BP   controlled    Plan  Continue strict blood pressure control with now   home blood pressure medications.  Physical and  occupational and speech therapy consults. Mobilize out of bed. I had a long discussion with  the patient and his grandson regarding its presentation, prognosis, plan for evaluation, treatment and answer questions.Transfer to the floor today and depending upon therapy recommendations decide on discharging home or to inpatient rehabilitation for the next one to 2 days.  LOS: 1 day   Kolbie Lepkowski,PRAMODKUMAR P 11/23/2011, 9:41 AM

## 2011-11-23 NOTE — Progress Notes (Signed)
PT Cancellation Note  PT by room 2x this afternoon, first attempt pt had BP = 190/56. Pt given BP medication, PT followed up later and BP = 180/56. Pt aware that PT being held until BP decreases. Will follow up in AM. Thanks.  Govind Furey (Beverely Pace) Carleene Mains PT, DPT Acute Rehabilitation [phone]

## 2011-11-24 DIAGNOSIS — I619 Nontraumatic intracerebral hemorrhage, unspecified: Secondary | ICD-10-CM

## 2011-11-24 MED ORDER — LABETALOL HCL 5 MG/ML IV SOLN
5.0000 mg | Freq: Once | INTRAVENOUS | Status: AC
  Administered 2011-11-24: 5 mg via INTRAVENOUS

## 2011-11-24 NOTE — Progress Notes (Signed)
OT Cancellation Note  Treatment cancelled today due to:  Pt currently receiving doppler procedure. OT will reattempt at later date and time.    Harrel Carina [place]   OTR/L Pager: [phone] Office: [phone] .

## 2011-11-24 NOTE — Consult Note (Signed)
Physical Medicine and Rehabilitation Consult Reason for Consult: Problems controlling left side. Referring Phsyician: Dr. Pearlean Brownie    HPI: Charles Tapia is an 78 y.o. male with history of HTN, CAD admitted 01/19 with problems controlling left side and slurred speech.  CT head with right periventricular hemorrhage. Blood pressure elevated requiring cardene drip.  MRI brain with right putaminal ICH with mild surrounding edema.  Carotid dopplers without ICA stenosis. 2 D echo EF 50-55%, hypokinesis of inferoseptal myocardium, moderately calcified AV. Patient continues with left sided weakness and labile BP. PT evaluation done today and patient continues with balance deficits with posterior lean.  Need cues for midline and upright posture.  MD, PT recommending CIR.  Review of Systems  HENT: Positive for hearing loss.   Respiratory: Negative for cough and shortness of breath.   Cardiovascular: Negative for chest pain.  Gastrointestinal: Positive for constipation. Negative for abdominal pain.  Musculoskeletal: Positive for back pain, joint pain and falls.  Neurological: Positive for speech change, focal weakness and weakness. Negative for headaches.  Psychiatric/Behavioral: Negative for depression and hallucinations.   Past Medical History  Diagnosis Date  . Hypertension   . Cardiomyopathy   . CAD (coronary artery disease)   . Gout   . Hyperlipidemia    Chronic LBP radiating to RLE (ESI- one month ago)   . Mitral regurgitation    Past Surgical History  Procedure Date  . Coronary bypass graft stenosis   . Coronary artery bypass graft   . Appendectomy   . Back surgery 2007   Family History  Problem Relation Age of Onset  . 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 He reports that he has never smoked. He has never used smokeless tobacco. He reports that he does not drink  alcohol or use illicit drugs. Family plans on hiring 24 hours assistance past discharge.   Allergies  Allergen Reactions  . Penicillins Hives and Rash    Prior to Admission medications   Medication Sig Start Date End Date Taking? Authorizing Provider  allopurinol (ZYLOPRIM) 300 MG tablet Take 300 mg by mouth daily.   Yes Historical Provider, MD  calcium carbonate (OS-CAL) 600 MG TABS Take 600 mg by mouth daily.   Yes Historical Provider, MD  enalapril (VASOTEC) 10 MG tablet Take 10 mg by mouth daily.   Yes Historical Provider, MD  fish oil-omega-3 fatty acids 1000 MG capsule Take 2 g by mouth daily.   Yes Historical Provider, MD  metoprolol succinate (TOPROL-XL) 50 MG 24 hr tablet Take 50 mg by mouth daily.   Yes Historical Provider, MD  polyethylene glycol (MIRALAX / GLYCOLAX) packet Take 17 g by mouth daily.   Yes Historical Provider, MD  simvastatin (ZOCOR) 20 MG tablet Take 20 mg by mouth daily.   Yes Historical Provider, MD  Tamsulosin HCl (FLOMAX) 0.4 MG CAPS Take 0.4 mg by mouth daily.   Yes Historical Provider, MD   Scheduled Medications:    . allopurinol  300 mg Oral Daily  . calcium carbonate  1 tablet Oral Daily  . enalapril  10 mg Oral Once  . enalapril  20 mg Oral Daily  . labetalol  5 mg Intravenous Once  . metoprolol succinate  50 mg Oral Daily  . omega-3 acid ethyl esters  1 g Oral BID  . pantoprazole  40 mg Oral  [id number]  . polyethylene glycol  17 g Oral Daily  . senna-docusate  1 tablet Oral BID  . simvastatin  20 mg Oral Daily  . Tamsulosin HCl  0.4 mg Oral Daily  . DISCONTD: enalapril  10 mg Oral Daily  . DISCONTD: pantoprazole (PROTONIX) IV  40 mg Intravenous QHS   PRN MED's: acetaminophen, acetaminophen, labetalol, traMADol, DISCONTD: ondansetron (ZOFRAN) IV Home: Home Living Lives With: Spouse (wheelchair bound) Receives Help From: Family;Personal care attendant (family to assist if needed and [facility] care to be arranged) Type of Home: House Home Layout: One  level Home Access: Ramped entrance Bathroom Shower/Tub: Tub/shower unit;Walk-in shower Bathroom Toilet: Handicapped height Bathroom Accessibility: Yes How Accessible: Accessible via wheelchair Home Adaptive Equipment: Bedside commode/3-in-1;Straight cane;Tub transfer bench;Walker - rolling;Shower chair with back  Functional History: Prior Function Level of Independence: Independent with basic ADLs;Requires assistive device for independence Driving: Yes Vocation: Retired Functional Status:  Mobility: Bed Mobility Bed Mobility: Yes Supine to Sit: 3: Mod assist;With rails;HOB elevated (Comment degrees) (30 degrees) Supine to Sit Details (indicate cue type and reason): (A) to initiate mobility with max cues for technique with (A) to elevate trunk and advance LE to EOB Sitting - Scoot to Edge of Bed: 2: Max assist;With rail Sitting - Scoot to [place] of Bed Details (indicate cue type and reason): (A) for proper weight shifts to advance hips to EOB.  Pt tends to lean posterior during scooting Transfers Transfers: Yes Sit to Stand: 3: Mod assist;From bed;From chair/3-in-1 Sit to Stand Details (indicate cue type and reason): (A) to initiate transfer with cues for hand placement and maintain balance during transfer. Stand to Sit: To chair/3-in-1;3: Mod assist;Without upper extremity assist;With armrests Stand to Sit Details: (A) to slowly descend to recliner with max cues for technique Ambulation/Gait Ambulation/Gait: Yes Ambulation/Gait Assistance: 3: Mod assist Ambulation/Gait Assistance Details (indicate cue type and reason): (A) to maintain balance with posterior lean throughout ambulation.  Pt able to take 4-5 side steps to recliner. Ambulation Distance (Feet): 5 Feet Assistive device: 1 person hand held assist Gait Pattern: Step-to pattern;Shuffle Stairs: No    ADL:    Cognition: Cognition Arousal/Alertness: Awake/alert Orientation Level: Oriented X4 Cognition Arousal/Alertness:  Awake/alert Overall Cognitive Status: Appears within functional limits for tasks assessed Orientation Level: Oriented X4  Blood pressure 176/74, pulse 59, temperature 97.6 F (36.4 C), temperature source Oral, resp. rate 20, height 5\' 8"  (1.727 m), weight 65.772 kg (145 lb), SpO2 97.00%. Physical Exam  Constitutional: He is oriented to person, place, and time. He appears well-developed.       Thin elderly male  HENT:  Head: Normocephalic and atraumatic.  Eyes: Pupils are equal, round, and reactive to light.  Neck: Normal range of motion.  Cardiovascular: Normal rate and regular rhythm.   Pulmonary/Chest: Effort normal and breath sounds normal.  Abdominal: Soft. Bowel sounds are normal.  Neurological: He is alert and oriented to person, place, and time. A cranial nerve deficit (left facial weakness.) is present.       Patient is alert and oriented. He is a left central 7 and tongue deviation. Speech is fairly clear. He has reasonable insight and awareness. He might have a mild left hemi-inattention. Left upper extremity is 1/5 at the deltoids 2 minus the biceps and triceps and 2+ at the wrist and hand intrinsics. Left lower extremities grossly 2/5 proximally to 3/5 distally. He has some difficulty initiating on that side. There may be some mild sensory deficits although these are minimal at  best. Reflexes are 1+ throughout.  Skin: Skin is warm and dry.  Psychiatric: He has a normal mood and affect. His behavior is normal.    No results found for this or any previous visit (from the past 24 hour(s)). Ct Head Wo Contrast  11/23/2011  *RADIOLOGY REPORT*  Clinical Data: Intracerebral hemorrhage.  CT HEAD WITHOUT CONTRAST  Technique:  Contiguous axial images were obtained from the base of the skull through the vertex without contrast.  Comparison: 11/22/2011  Findings: Again noted is the small area of hemorrhage within the right basal ganglia, measuring 11 mm maximally.  This is unchanged. No new  areas of hemorrhage.  Atrophy and chronic small vessel disease throughout the deep white matter.  No hydrocephalus, mass effect midline shift.  IMPRESSION: Stable small area of hemorrhage in the right basal ganglia.  Original Report Authenticated By: Cyndie Chime, M.D.   Mr Brain Wo Contrast  11/22/2011  *RADIOLOGY REPORT*  Clinical Data: Follow-up known intracranial hemorrhage.  The patient awoke unable to control the left side.  The patient has a history of hypertension, cardiomyopathy, and coronary artery disease.  MRI HEAD WITHOUT CONTRAST  Technique:  Multiplanar, multiecho pulse sequences of the brain and surrounding structures were obtained according to standard protocol without intravenous contrast.  Comparison: CT head 11/22/2011  Findings: 9 x 11 x 14 mm right posterior putaminal intracerebral hemorrhage with mild surrounding edema is redemonstrated.  The total volume of this hematoma calculates to approximately 0.7 ml.  There is no visible vascular malformation.  No intracranial aneurysm is seen.  There is no acute infarction  although areas of mild restricted diffusion surround the hematoma, and could reflect relative ischemia.  There is advanced atrophy with chronic microvascular ischemic change. There are no other significant foci of chronic hemorrhage observed.  There is no midline shift.  Mild chronic sinus disease is present.  Empty sella is noted. Moderate pannus surrounds the odontoid.  Compared with prior CT, there is no significant change.  IMPRESSION: 9 x 11 x 14 mm right posterior putaminal intracerebral hemorrhage with mild surrounding edema. No significant mass effect or midline shift.  Atrophy with chronic microvascular ischemic change.  Original Report Authenticated By: Elsie Stain, M.D.    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 Does the need for close, 24 hr/day medical supervision in concert with the patient's rehab needs  make it unreasonable for this patient to be served in a less intensive setting? Yes and Potentially 2. Co-Morbidities requiring supervision/potential complications: Hypertension, CAD, gout, cardiomyopathy 3. Due to bladder management, bowel management, safety, skin/wound care, disease management, medication administration and patient education, does the patient require 24 hr/day rehab nursing? Yes and Potentially 4. Does the patient require coordinated care of a physician, rehab nurse, PT (1-2 hrs/day, 5 days/week), OT (1-2 hrs/day, 5 days/week) and SLP (1-2 hrs/day, 5 days/week) to address physical and functional deficits in the context of the above medical diagnosis(es)? Yes Addressing deficits in the following areas: balance, bathing, bowel/bladder control, cognition, dressing, endurance, feeding, grooming, locomotion, psychosocial adjustment, speech, strength, toileting and transferring 5. Can the patient actively participate in an intensive therapy program of at least 3 hrs of therapy per day at least 5 days per week? Yes 6. The potential for patient to make measurable gains while on inpatient rehab is excellent 7. Anticipated functional outcomes upon discharge from inpatients are supervision PT, supervision to minimal assistance OT, supervision to modified independent SLP 8. Estimated rehab length  of stay to reach the above functional goals is: 2-3 weeks 9. Does the patient have adequate social supports to accommodate these discharge functional goals? Potentially 10. Anticipated D/C setting: Home 11. Anticipated post D/C treatments: Outpt therapy 12. Overall Rehab/Functional Prognosis: excellent  RECOMMENDATIONS: This patient's condition is appropriate for continued rehabilitative care in the following setting: CIR (vs SNF) Patient has agreed to participate in recommended program. Yes and Potentially Note that insurance prior authorization may be required for reimbursement for recommended  care.  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 He does pay someone to help out during the day and family has helped him intermittently in the evening especially since he's been gone.  Need to confirm whether the family can provide needs assistance for both the patient and his wife after a potential rehabilitation stay. If they cannot put together help at home we'll likely need to look at skilled nursing facility placement.   Ivory Broad, MD 11/24/2011

## 2011-11-24 NOTE — Progress Notes (Signed)
Stroke Team Progress Note  HISTORY Charles Tapia is an 78 y.o. male who reports that when he went to bed 11/21/2011 he was feeling at his baseline. He awakened in the middle of the night to go to the bathroom and noted that he was unable to control his left side. He did not report any numbness on the left. Initial NIHSS of 4.  SUBJECTIVE His daughter is at the bedside. Overall he feels his condition is stable. He lived with his wife with dementia prior to admission. He provided care for her. Family is in process of hiring someone to help care for both of them in the future.  OBJECTIVE Most recent Vital Signs: Temp: 97.6 F (36.4 C) (01/21 0955) Temp src: Oral (01/21 0955) BP: 176/74 mmHg (01/21 0955) Pulse Rate: 59  (01/21 0955) Respiratory Rate: 20 O2 Saturation: 97%  CBG (last 3)  Basename 11/22/11 0431  GLUCAP 112*   Intake/Output from previous day: 01/20 0701 - 01/21 0700 In: 240 [P.O.:240] Out: 1775 [Urine:1775]  IV Fluid Intake:     . DISCONTD: niCARDipine 2.5 mg/hr (11/23/11 0500)   Medications    . allopurinol  300 mg Oral Daily  . calcium carbonate  1 tablet Oral Daily  . enalapril  10 mg Oral Once  . enalapril  20 mg Oral Daily  . labetalol  5 mg Intravenous Once  . metoprolol succinate  50 mg Oral Daily  . omega-3 acid ethyl esters  1 g Oral BID  . pantoprazole  40 mg Oral Q0600  . polyethylene glycol  17 g Oral Daily  . senna-docusate  1 tablet Oral BID  . simvastatin  20 mg Oral Daily  . Tamsulosin HCl  0.4 mg Oral Daily  . DISCONTD: enalapril  10 mg Oral Daily  . DISCONTD: pantoprazole (PROTONIX) IV  40 mg Intravenous QHS  PRN acetaminophen, acetaminophen, labetalol, traMADol, DISCONTD: ondansetron (ZOFRAN) IV  Diet:  Cardiac thin liquids Activity:   Up with assistance DVT Prophylaxis:  SCDs   Studies: CBC    Component Value Date/Time   WBC 8.4 11/22/2011 0400   RBC 3.51* 11/22/2011 0400   HGB 12.0* 11/22/2011 0400   HCT 35.4* 11/22/2011 0400   PLT  161 11/22/2011 0400   MCV 100.9* 11/22/2011 0400   MCH 34.2* 11/22/2011 0400   MCHC 33.9 11/22/2011 0400   RDW 13.7 11/22/2011 0400   LYMPHSABS 2.8 11/22/2011 0400   MONOABS 0.7 11/22/2011 0400   EOSABS 0.1 11/22/2011 0400   BASOSABS 0.0 11/22/2011 0400   CMP    Component Value Date/Time   NA 137 11/22/2011 0400   K 4.9 11/22/2011 0400   CL 104 11/22/2011 0400   CO2 23 11/22/2011 0400   GLUCOSE 106* 11/22/2011 0400   BUN 34* 11/22/2011 0400   CREATININE 1.26 11/22/2011 0400   CALCIUM 9.6 11/22/2011 0400   PROT 6.2 11/22/2011 0400   ALBUMIN 3.5 11/22/2011 0400   AST 22 11/22/2011 0400   ALT 14 11/22/2011 0400   ALKPHOS 73 11/22/2011 0400   BILITOT 0.5 11/22/2011 0400   GFRNONAA 48* 11/22/2011 0400   GFRAA 56* 11/22/2011 0400   COAGS Lab Results  Component Value Date   INR 1.05 11/22/2011   INR 4.62* 11/22/2011   Lipid Panel No results found for this basename: chol, trig, hdl, cholhdl, vldl, ldlcalc   HgbA1C  No results found for this basename: HGBA1C   Urine Drug Screen  No results found for this basename: labopia, cocainscrnur, labbenz, amphetmu,  thcu, labbarb    Alcohol Level No results found for this basename: eth   CT of the brain   Stable small area of hemorrhage in the right basal ganglia.   MRI of the brain   9 x 11 x 14 mm right posterior putaminal intracerebral hemorrhage with mild surrounding edema. No significant mass effect or midline shift.  Atrophy with chronic microvascular ischemic change.   MRA of the brain  Not ordered   2D Echocardiogram  estimated ejection fraction was in the range of 50% to 55%. Contraction pattern was dyssynchronous with hypokinesis of the inferoseptal myocardium.  Carotid Doppler  No internal carotid artery stenosis bilaterally. Vertebrals with antegrade flow bilaterally.   CXR  Not ordered   EKG  normal sinus rhythm.   Physical Exam  awake alert.Afebrile. Head is nontraumatic. Neck is supple without carotid bruit. Hearing is slightly  decreased bilaterally. Cardiac exam ejection murmur no gallop. Lungs clear to auscultation. Abdomen soft nontender.  Neurological exam awake, alert, oriented x3 with normal speech and language function. Mild dysarthria but can be understood easily. Extraocular movements are full range without nystagmus. Visual fields are full to confrontational testing. There is mild left lower facial weakness. Tongue is midline. Motor system exam reveals left upper extremity drift with the hand hitting the bed. There is grade 2-3 strength in the left upper extremity and 4+/5 strength in the left lower extremity.mild weakness of left grip and hand muscles. Sensation appears intact. Left is upgoing right is downgoing. Gait was not tested.   ASSESSMENT [name] is a 78 y.o. male with a right basal ganglia hemorrhage, secondary to hypertension. Not on antiplatelets secondary to hemorrhage. Therapy recommends inpatient rehab.  Stroke risk factors:  hyperlipidemia, hypertension and CABG  Hospital day # 2  TREATMENT/PLAN Check TCD. Rehab consult.Continue mobilization out of bed.  Joaquin Music, ANP-BC, GNP-BC [facility] Pager: [phone] 11/24/2011 11:00 AM  Dr. Delia Heady, Stroke Center Medical Director, has personally reviewed chart, pertinent data, examined the patient and developed the plan of care.

## 2011-11-24 NOTE — Progress Notes (Signed)
Physical Therapy Evaluation Patient Details Name: Charles Tapia MRN: [id number] DOB: 03-17-1918 Today's Date: 11/24/2011  Problem List:  Patient Active Problem List  Diagnoses  . Intracerebral hemorrhage  . Hypertension  . Hyperlipidemia  . Coronary artery disease  . Gout  . Cardiomyopathy  . Mitral regurgitation    Past Medical History:  Past Medical History  Diagnosis Date  . Hypertension   . Cardiomyopathy   . CAD (coronary artery disease)   . Gout   . Hyperlipidemia   . Mitral regurgitation    Past Surgical History:  Past Surgical History  Procedure Date  . Coronary bypass graft stenosis   . Coronary artery bypass graft   . Appendectomy   . Back surgery     PT Assessment/Plan/Recommendation PT Assessment Clinical Impression Statement: Pt is 78 y/o male admitted for right basal ganglia hemorrhage resulting in left sided weakness.  Pt will benefit from acute PT services to improve strength, balance, mobility and gait to improve overall mobility to prepare for safe d/c to next venue.  Educated family and pt on inpatient rehab and all options with therapy.  Family plans to set up 24hr assistance at home to assist with pt's wife that is wheelchair bound and pt at time of d/c.   PT Recommendation/Assessment: Patient will need skilled PT in the acute care venue PT Problem List: Decreased strength;Decreased range of motion;Decreased activity tolerance;Decreased balance;Decreased mobility;Decreased knowledge of use of DME PT Therapy Diagnosis : Difficulty walking;Abnormality of gait;Generalized weakness PT Plan PT Frequency: Min 4X/week PT Treatment/Interventions: DME instruction;Gait training;Stair training;Functional mobility training;Therapeutic activities;Therapeutic exercise;Balance training;Neuromuscular re-education;Patient/family education PT Recommendation Recommendations for Other Services: Rehab consult Follow Up Recommendations: Inpatient Rehab Equipment  Recommended: Defer to next venue PT Goals  Acute Rehab PT Goals PT Goal Formulation: With patient/family Time For Goal Achievement: 2 weeks Pt will go Supine/Side to Sit: with supervision PT Goal: Supine/Side to Sit - Progress: Goal set today Pt will Sit at [facility] of Bed: with supervision;3-5 min;with no upper extremity support PT Goal: Sit at Edge Of Bed - Progress: Goal set today Pt will go Sit to Supine/Side: with supervision PT Goal: Sit to Supine/Side - Progress: Goal set today Pt will go Sit to Stand: with min assist PT Goal: Sit to Stand - Progress: Goal set today Pt will go Stand to Sit: with supervision PT Goal: Stand to Sit - Progress: Goal set today Pt will Transfer Bed to Chair/Chair to Bed: with min assist PT Transfer Goal: Bed to Chair/Chair to Bed - Progress: Goal set today Pt will Stand: 3 - 5 min;with min assist PT Goal: Stand - Progress: Goal set today Pt will Ambulate: 51 - 150 feet;with min assist;with least restrictive assistive device PT Goal: Ambulate - Progress: Goal set today  PT Evaluation Precautions/Restrictions  Precautions Precautions: Fall Restrictions Weight Bearing Restrictions: No Prior Functioning  Home Living Lives With: Spouse (wheelchair bound) Receives Help From: Family;Personal care attendant (family to assist if needed and [facility] care to be arranged) Type of Home: House Home Layout: One level Home Access: Ramped entrance Bathroom Shower/Tub: Tub/shower unit;Walk-in shower Bathroom Toilet: Handicapped height Bathroom Accessibility: Yes How Accessible: Accessible via wheelchair Home Adaptive Equipment: Bedside commode/3-in-1;Straight cane;Tub transfer bench;Walker - rolling;Shower chair with back Prior Function Level of Independence: Independent with basic ADLs;Requires assistive device for independence Driving: Yes Vocation: Retired Financial risk analyst Arousal/Alertness: Awake/alert Overall Cognitive Status: Appears within functional  limits for tasks assessed Orientation Level: Oriented X4 Sensation/Coordination Sensation Light Touch: Appears Intact Proprioception:  Appears Intact Coordination Heel Shin Test: [facility] Extremity Assessment RUE Assessment RUE Assessment: Exceptions to [facility] RUE AROM (degrees) Right Shoulder Flexion  0-170: 40 Degrees LUE Assessment LUE Assessment: Exceptions to [facility] LUE Strength Left Shoulder Flexion: 2-/5 Grip (lbs): decrease grip strength with 2/5 gross RLE Assessment RLE Assessment: Within Functional Limits LLE Assessment LLE Assessment: Within Functional Limits Mobility (including Balance) Bed Mobility Bed Mobility: Yes Supine to Sit: 3: Mod assist;With rails;HOB elevated (Comment degrees) (30 degrees) Supine to Sit Details (indicate cue type and reason): (A) to initiate mobility with max cues for technique with (A) to elevate trunk and advance LE to EOB Sitting - Scoot to Edge of Bed: 2: Max assist;With rail Sitting - Scoot to [place] of Bed Details (indicate cue type and reason): (A) for proper weight shifts to advance hips to EOB.  Pt tends to lean posterior during scooting Transfers Transfers: Yes Sit to Stand: 3: Mod assist;From bed;From chair/3-in-1 Sit to Stand Details (indicate cue type and reason): (A) to initiate transfer with cues for hand placement and maintain balance during transfer. Stand to Sit: To chair/3-in-1;3: Mod assist;Without upper extremity assist;With armrests Stand to Sit Details: (A) to slowly descend to recliner with max cues for technique Ambulation/Gait Ambulation/Gait: Yes Ambulation/Gait Assistance: 3: Mod assist Ambulation/Gait Assistance Details (indicate cue type and reason): (A) to maintain balance with posterior lean throughout ambulation.  Pt able to take 4-5 side steps to recliner. Ambulation Distance (Feet): 5 Feet Assistive device: 1 person hand held assist Gait Pattern: Step-to pattern;Shuffle Stairs: No  Balance Balance Assessed:  Yes Static Sitting Balance Static Sitting - Balance Support: Feet supported Static Sitting - Level of Assistance: 4: Min assist Static Sitting - Comment/# of Minutes: Intermittent min (A) to maintain balance secondary to posterior lean with posterior pelvic tilt. Dynamic Sitting Balance Dynamic Sitting - Balance Support: Feet unsupported Dynamic Sitting - Level of Assistance: 3: Mod assist Dynamic Sitting Balance - Compensations: Intermittent mod (A) with severe posterior lean when attempting MMT on bilateral LE Static Standing Balance Static Standing - Level of Assistance: 3: Mod assist Static Standing - Comment/# of Minutes: ~5 minutes standing with posterior lean.  Max cues for midline and upright posture.  Pt tends to push posterior with further input to stand upright. Exercise    End of Session PT - End of Session Equipment Utilized During Treatment: Gait belt Activity Tolerance: Patient tolerated treatment well Patient left: in chair;with call bell in reach;with family/visitor present Nurse Communication: Mobility status for transfers;Mobility status for ambulation General Behavior During Session: [facility] for tasks performed Cognition: [facility] for tasks performed  Abrar Bilton 11/24/2011, 9:16 AM [phone]

## 2011-11-24 NOTE — Progress Notes (Signed)
VASCULAR LAB  TCD completed.  Terance Hart, RVT 11/24/2011 1:53 PM.

## 2011-11-24 NOTE — Progress Notes (Signed)
  Speech Language Pathology Evaluation Patient Details Name: Charles Tapia MRN: [id number] DOB: 1918-04-07 Today's Date: 11/24/2011  Problem List:  Patient Active Problem List  Diagnoses  . Intracerebral hemorrhage  . Hypertension  . Hyperlipidemia  . Coronary artery disease  . Gout  . Cardiomyopathy  . Mitral regurgitation   Past Medical History:  Past Medical History  Diagnosis Date  . Hypertension   . Cardiomyopathy   . CAD (coronary artery disease)   . Gout   . Hyperlipidemia   . Mitral regurgitation    Past Surgical History:  Past Surgical History  Procedure Date  . Coronary bypass graft stenosis   . Coronary artery bypass graft   . Appendectomy   . Back surgery     SLP Assessment/Plan/Recommendation Assessment Clinical Impression Statement: 78 year old male admitted with left-sided weakness, work-up revealed right basal ganglia hemorrage. Cognitive-linguistic evaluation administered today and pt's cognitive function appears [facility] for tasks assessed. Pt demonstrates appropriate attention, working memory for recent events, safety/judgement and insight.  Pt did demonstrate a hoarse vocal quality but was 100% intellgibile. Pt does not need  skilled SLP services at the acute level but would  benefit  from a rehab consult to  further evaluate high-level cognitive functions. SLP Recommendation/Assessment: All further Speech Lanaguage Pathology  needs can be addressed in the next venue of care SLP Recommendations Recommendations for Other Services: Rehab consult Equipment Recommended: None recommended by SLP  SLP Goals  N/A  SLP Evaluation Prior Functioning Type of Home: House Lives With: Spouse (wheelchair bound) Receives Help From: Family;Personal care attendant (family to assist if needed and [facility] care to be arranged) Vocation: Retired  IT consultant Overall Cognitive Status: Appears within functional limits for tasks assessed Arousal/Alertness: Awake/alert Orientation  Level: Oriented X4 Attention: Sustained Sustained Attention: Appears intact Memory: Appears intact Awareness: Appears intact Problem Solving: Appears intact Safety/Judgment: Appears intact  Comprehension Auditory Comprehension Overall Auditory Comprehension: Appears within functional limits for tasks assessed Interfering Components: Hearing;Other (comment) (wears hearing aids, needs to be adjusted by computer)  Expression Expression Primary Mode of Expression: Verbal Verbal Expression Overall Verbal Expression: Impaired Initiation: No impairment Level of Generative/Spontaneous Verbalization: Conversation Repetition: No impairment Naming: No impairment Pragmatics: No impairment Other Verbal Expression Comments: Pt reports some word-finding difficulty, but none noted throughout evaluation. Written Expression Dominant Hand: Right  Oral/Motor Oral Motor/Sensory Function Overall Oral Motor/Sensory Function: Impaired Labial ROM: Reduced left;Other (Comment) (minimal anterior spillage) Labial Symmetry: Abnormal symmetry left Motor Speech Respiration: Within functional limits Phonation: Hoarse Resonance: Within functional limits Articulation: Within functional limitis Intelligibility: Intelligible Motor Planning: Witnin functional limits Motor Speech Errors: Not applicable  Ariq Khamis 11/24/2011, 3:03 PM

## 2011-11-24 NOTE — Progress Notes (Signed)
Pt's blood pressure since 02:00AM has been high (SBP in 190's) PRN meds given as per order however SBP only dropped down to 177 and went back up to 190's again. Paged MD on call and received an order for 5 mg labetolol once and then to return to q2 hours as needed. Will continue to monitor pt closely

## 2011-11-25 NOTE — Progress Notes (Signed)
Rehab admissions - Evaluated for possible admission.  I spoke with daughter in room.  She lives out of town.  She is talking with patient about rehab options and will let me know about possible inpatient rehab.  I will need insurance authorization as well once daughter informs me of her decision.  Pager [phone]

## 2011-11-25 NOTE — Progress Notes (Signed)
Physical Therapy Treatment Patient Details Name: Hannan Hutmacher MRN: [id number] DOB: 1917-12-19 Today's Date: 11/25/2011  PT Assessment/Plan  PT - Assessment/Plan Comments on Treatment Session: pt presents with ICH.  pt very pleasant and motivated for mobility.   PT Plan: Discharge plan remains appropriate PT Frequency: Min 4X/week Recommendations for Other Services: Rehab consult Follow Up Recommendations: Inpatient Rehab Equipment Recommended: Defer to next venue PT Goals  Acute Rehab PT Goals PT Goal: Supine/Side to Sit - Progress: Not met PT Goal: Sit at Edge Of Bed - Progress: Progressing toward goal PT Goal: Sit to Supine/Side - Progress: Progressing toward goal PT Goal: Sit to Stand - Progress: Progressing toward goal PT Goal: Stand to Sit - Progress: Progressing toward goal PT Transfer Goal: Bed to Chair/Chair to Bed - Progress: Progressing toward goal PT Goal: Stand - Progress: Progressing toward goal PT Goal: Ambulate - Progress: Progressing toward goal  PT Treatment Precautions/Restrictions  Precautions Precautions: Fall Restrictions Weight Bearing Restrictions: No Mobility (including Balance) Bed Mobility Bed Mobility: Yes Sit to Supine: 4: Min assist;HOB flat Sit to Supine - Details (indicate cue type and reason): A with LEs only  Transfers Transfers: Yes Sit to Stand: 4: Min assist;From chair/3-in-1;With upper extremity assist Sit to Stand Details (indicate cue type and reason): cues for use of UEs and to scoot to edge of chair Stand to Sit: 4: Min assist;With upper extremity assist;To bed Stand to Sit Details: cues to use UEs to control descent Ambulation/Gait Ambulation/Gait: Yes Ambulation/Gait Assistance: 4: Min assist Ambulation/Gait Assistance Details (indicate cue type and reason): cues and A for L hand on RW and steering RW Ambulation Distance (Feet): 100 Feet Assistive device: Rolling walker Gait Pattern: Step-through pattern;Decreased step length -  left Stairs: No Wheelchair Mobility Wheelchair Mobility: No  Posture/Postural Control Posture/Postural Control: No significant limitations Balance Balance Assessed: No Exercise    End of Session PT - End of Session Equipment Utilized During Treatment: Gait belt Activity Tolerance: Patient tolerated treatment well Patient left: in bed;with call bell in reach Nurse Communication: Mobility status for transfers;Mobility status for ambulation General Behavior During Session: [facility] for tasks performed Cognition: [facility] for tasks performed  Sunny Schlein, [place] [phone] 11/25/2011, 3:24 PM

## 2011-11-25 NOTE — Progress Notes (Signed)
Stroke Team Progress Note  HISTORY Charles Tapia is an 78 y.o. male who reports that when he went to bed 11/21/2011 he was feeling at his baseline. He awakened in the middle of the night to go to the bathroom and noted that he was unable to control his left side. He did not report any numbness on the left. Initial NIHSS of 4. He lived with his wife with dementia prior to admission. He provided care for her. Family isconsideringhiring someone to help care for both of them in the future.  SUBJECTIVE His daughter is at the bedside. Overall he feels his condition is stable. Family is deciding about CIR vs home with help vs SNF.  OBJECTIVE Most recent Vital Signs: Temp: 98.4 F (36.9 C) (01/22 0500) Temp src: Oral (01/22 0500) BP: 160/80 mmHg (01/22 0630) Pulse Rate: 66  (01/22 0500) Respiratory Rate: 18 O2 Saturation: 96%  CBG (last 3) No results found for this basename: GLUCAP:3 in the last 72 hours Intake/Output from previous day: 01/21 0701 - 01/22 0700 In: 940 [P.O.:940] Out: 400 [Urine:400]  IV Fluid Intake:    Medications    . allopurinol  300 mg Oral Daily  . calcium carbonate  1 tablet Oral Daily  . enalapril  20 mg Oral Daily  . metoprolol succinate  50 mg Oral Daily  . omega-3 acid ethyl esters  1 g Oral BID  . pantoprazole  40 mg Oral Q0600  . polyethylene glycol  17 g Oral Daily  . senna-docusate  1 tablet Oral BID  . simvastatin  20 mg Oral Daily  . Tamsulosin HCl  0.4 mg Oral Daily  PRN acetaminophen, acetaminophen, labetalol, traMADol  Diet:  Cardiac thin liquids Activity:   Up with assistance DVT Prophylaxis:  SCDs   Studies: CBC    Component Value Date/Time   WBC 8.4 11/22/2011 0400   RBC 3.51* 11/22/2011 0400   HGB 12.0* 11/22/2011 0400   HCT 35.4* 11/22/2011 0400   PLT 161 11/22/2011 0400   MCV 100.9* 11/22/2011 0400   MCH 34.2* 11/22/2011 0400   MCHC 33.9 11/22/2011 0400   RDW 13.7 11/22/2011 0400   LYMPHSABS 2.8 11/22/2011 0400   MONOABS 0.7 11/22/2011 0400     EOSABS 0.1 11/22/2011 0400   BASOSABS 0.0 11/22/2011 0400   CMP    Component Value Date/Time   NA 137 11/22/2011 0400   K 4.9 11/22/2011 0400   CL 104 11/22/2011 0400   CO2 23 11/22/2011 0400   GLUCOSE 106* 11/22/2011 0400   BUN 34* 11/22/2011 0400   CREATININE 1.26 11/22/2011 0400   CALCIUM 9.6 11/22/2011 0400   PROT 6.2 11/22/2011 0400   ALBUMIN 3.5 11/22/2011 0400   AST 22 11/22/2011 0400   ALT 14 11/22/2011 0400   ALKPHOS 73 11/22/2011 0400   BILITOT 0.5 11/22/2011 0400   GFRNONAA 48* 11/22/2011 0400   GFRAA 56* 11/22/2011 0400   COAGS Lab Results  Component Value Date   INR 1.05 11/22/2011   INR 4.62* 11/22/2011   Lipid Panel No results found for this basename: chol,  trig,  hdl,  cholhdl,  vldl,  ldlcalc   HgbA1C  No results found for this basename: HGBA1C   Urine Drug Screen  No results found for this basename: labopia,  cocainscrnur,  labbenz,  amphetmu,  thcu,  labbarb    Alcohol Level No results found for this basename: eth   CT of the brain   Stable small area of hemorrhage in the  right basal ganglia.   MRI of the brain   9 x 11 x 14 mm right posterior putaminal intracerebral hemorrhage with mild surrounding edema. No significant mass effect or midline shift.  Atrophy with chronic microvascular ischemic change.   MRA of the brain  Not ordered   2D Echocardiogram  estimated ejection fraction was in the range of 50% to 55%. Contraction pattern was dyssynchronous with hypokinesis of the inferoseptal myocardium.  Carotid Doppler  No internal carotid artery stenosis bilaterally. Vertebrals with antegrade flow bilaterally.   Transcranial Doppler performed, results pending  CXR  Not ordered   EKG  normal sinus rhythm.   Physical Exam  awake alert.Afebrile. Head is nontraumatic. Neck is supple without carotid bruit. Hearing is slightly decreased bilaterally. Cardiac exam ejection murmur no gallop. Lungs clear to auscultation. Abdomen soft nontender.  Neurological exam  awake, alert, oriented x3 with normal speech and language function. Mild dysarthria but can be understood easily. Extraocular movements are full range without nystagmus. Visual fields are full to confrontational testing. There is mild left lower facial weakness. Tongue is midline. Motor system exam reveals left upper extremity drift with the hand hitting the bed. There is grade 2-3 strength in the left upper extremity and 4+/5 strength in the left lower extremity.mild weakness of left grip and hand muscles. Sensation appears intact. Left is upgoing right is downgoing. Gait was not tested.   ASSESSMENT [name] is a 78 y.o. male with a right basal ganglia hemorrhage, secondary to hypertension. Not on antiplatelets secondary to hemorrhage. Therapy recommends inpatient rehab.  Stroke risk factors:  hyperlipidemia, hypertension and CABG  Hospital day # 3  TREATMENT/PLAN Follow up TCD. Daughter deciding about Rehab options. No further workup indication. Will sign off. Follow up with Dr. Pearlean Brownie in 2 months.   Joaquin Music, ANP-BC, GNP-BC [facility] Pager: [phone] 11/25/2011 10:27 AM  Dr. Delia Heady, Stroke Center Medical Director, has personally reviewed chart, pertinent data, examined the patient and developed the plan of care.

## 2011-11-25 NOTE — Progress Notes (Signed)
Rehab admissions - I received a call from daughter requesting that I pursue inpatient rehab admission.  I have faxed info to [facility] carrier and will await their response.  Will follow up in am.  Pager [phone]

## 2011-11-25 NOTE — Progress Notes (Signed)
Occupational Therapy Evaluation Patient Details Name: Charles Tapia MRN: [id number] DOB: April 07, 1918 Today's Date: 11/25/2011  Problem List:  Patient Active Problem List  Diagnoses  . Intracerebral hemorrhage  . Hypertension  . Hyperlipidemia  . Coronary artery disease  . Gout  . Cardiomyopathy  . Mitral regurgitation    Past Medical History:  Past Medical History  Diagnosis Date  . Hypertension   . Cardiomyopathy   . CAD (coronary artery disease)   . Gout   . Hyperlipidemia   . Mitral regurgitation    Past Surgical History:  Past Surgical History  Procedure Date  . Coronary bypass graft stenosis   . Coronary artery bypass graft   . Appendectomy   . Back surgery     OT Assessment/Plan/Recommendation OT Assessment Clinical Impression Statement: 78 yo male s/p Rt basal ganglia hemorrhage that could benefit from skilled OT due to balance deficits and decreased strength in Lt UE. Pt has strong family support and currently arrangements are being made for 24/7 caregiver for pt and wife. Pt PTA  was primary care giver for wife that is w/c bound with Alzheimers. Pt pleasant cooperative and eager to participate with therapy. OT Recommendation/Assessment: Patient will need skilled OT in the acute care venue OT Problem List: Decreased strength;Decreased range of motion;Decreased activity tolerance;Impaired balance (sitting and/or standing);Decreased coordination;Impaired sensation;Impaired UE functional use OT Therapy Diagnosis : Generalized weakness;Hemiplegia non-dominant side OT Plan OT Frequency: Min 2X/week OT Treatment/Interventions: Self-care/ADL training;Neuromuscular education;DME and/or AE instruction;Therapeutic activities;Patient/family education;Balance training OT Recommendation Recommendations for Other Services: Rehab consult Follow Up Recommendations: Inpatient Rehab (excellent candidate!) Equipment Recommended: Defer to next venue Individuals Consulted Consulted  and Agree with Results and Recommendations: Family member/caregiver;Patient OT Goals Acute Rehab OT Goals OT Goal Formulation: With patient/family Time For Goal Achievement: 2 weeks ADL Goals Pt Will Perform Grooming: with supervision;Sitting at sink;Unsupported ADL Goal: Grooming - Progress: Goal set today Pt Will Perform Upper Body Bathing: with modified independence;Sitting, chair;Supported ADL Goal: Product manager - Progress: Goal set today Pt Will Perform Lower Body Bathing: with supervision;Sit to stand from chair;Unsupported ADL Goal: Lower Body Bathing - Progress: Goal set today Pt Will Perform Upper Body Dressing: with supervision;Sitting, chair;Unsupported (no verbale cue to dress Lt UE first) ADL Goal: Upper Body Dressing - Progress: Goal set today Pt Will Perform Lower Body Dressing: with supervision;Sit to stand from chair ADL Goal: Lower Body Dressing - Progress: Goal set today Pt Will Transfer to Toilet: with min assist;3-in-1;Ambulation ADL Goal: Toilet Transfer - Progress: Goal set today Pt Will Perform Toileting - Clothing Manipulation: with min assist;Sitting on 3-in-1 or toilet ADL Goal: Toileting - Clothing Manipulation - Progress: Goal set today Pt Will Perform Toileting - Hygiene: with min assist;Sit to stand from 3-in-1/toilet ADL Goal: Toileting - Hygiene - Progress: Goal set today  OT Evaluation Precautions/Restrictions  Precautions Precautions: Fall Restrictions Weight Bearing Restrictions: No Prior Functioning Home Living Lives With: Spouse (wife w/c bound with alzheimers) Receives Help From: Family;Personal care attendant Type of Home: House Home Layout: One level Home Access: Ramped entrance Bathroom Shower/Tub: Tub/shower unit;Walk-in shower Bathroom Toilet: Handicapped height Bathroom Accessibility: Yes How Accessible: Accessible via wheelchair Home Adaptive Equipment: Bedside commode/3-in-1;Straight cane;Tub transfer bench;Walker -  rolling;Shower chair with back Additional Comments: 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 Pt would assist with bed pan toileting at night time for wife. Prior Function Level of Independence: Independent with basic ADLs;Requires assistive device for independence Driving: Yes Vocation: Retired ADL ADL Eating/Feeding: Performed;Set up Eating/Feeding  Details (indicate cue type and reason): pt with orange spilled on pt and on the tray. Pt demonstrates decreased grasp and lack of awareness of Lt UE location Where Assessed - Eating/Feeding: Chair Grooming: Performed;Wash/dry hands;Teeth care;Moderate assistance (Mod A due to balance deficits) Grooming Details (indicate cue type and reason): Pt with posterior lean at sink. Pt hand over hand to place Lt UE on sink so pt weight bearing over hand during oral care. pt attempting to squeeze tooth paste with Lt hand. Pt max v/c to place tooth brush in Lt Ue and squeezing tooth paste with Rt UE. pt states "oh yeah that's better. that one just doesnt want to work"pt attempting multiple times to utilize the Lt UE. pt picking up wash cloth with lateral pinch thumb and 2nd digit. Pt with liquid spilling out of Lt side of mouth with oral care. Where Assessed - Grooming: Standing at sink Upper Body Dressing: Performed;Moderate assistance Upper Body Dressing Details (indicate cue type and reason): pt provided Max v'c to don gown due to orange juice spilled during breakfast. pt attempting to dress Rt Ue first. Pt hand over hand education on dressing Lt Ue first and pt return demonstrating.  Where Assessed - Upper Body Dressing: Sitting, chair;Supported IT sales professional Used: Other (comment) (hand held assistance) Ambulation Related to ADLs: Pt ambulating with posterior lean and hand held assistance with MOD A. Pt educated on stepping with Lt LE first . ADL Comments: Pt will require assistance with ADLS due to Lt LE deficits and posterior lean.  Vision/Perception  Vision -  History Baseline Vision: No visual deficits Patient Visual Report: No change from baseline Cognition Cognition Arousal/Alertness: Awake/alert Overall Cognitive Status: Appears within functional limits for tasks assessed Orientation Level: Oriented X4 Sensation/Coordination Sensation Light Touch: Impaired by gross assessment Stereognosis: Impaired by gross assessment (Lt UE) Proprioception Impaired Details: Impaired LLE Coordination Gross Motor Movements are Fluid and Coordinated: No Fine Motor Movements are Fluid and Coordinated: No Coordination and Movement Description: Pt with Lt UE deficits. pt able to pronate and supinate with extended time and visually attending to task. pt with decreased MMT 2- out 5 shoulder flexion. pt with decrease grasp brunstrom II (lateral pinch with thumb) pt unable to achieve digit flexion 2-5 digits Extremity Assessment RUE Assessment RUE Assessment: Exceptions to [facility] (shoulder flexion 40degrees PTA shoulder injury) RUE AROM (degrees) Right Shoulder Flexion  0-170: 40 Degrees LUE Assessment LUE Assessment: Exceptions to [facility] LUE Strength Left Shoulder Flexion: 2-/5 Grip (lbs): decrease grip strength with 2/5 gross Mobility  Bed Mobility Bed Mobility: No Transfers Transfers: Yes Sit to Stand: 3: Mod assist;From chair/3-in-1;With upper extremity assist Sit to Stand Details (indicate cue type and reason): Pt bracing LEs on chair edge for balance and compensating with hip flexion. pt attempting to use Lt UE on arm rest without cueing.  Stand to Sit: 3: Mod assist;With upper extremity assist;To bed Stand to Sit Details: cue to decrease speed to descend to chair Exercises   End of Session OT - End of Session Equipment Utilized During Treatment: Gait belt Activity Tolerance: Patient tolerated treatment well Patient left: in chair;with call bell in reach;with family/visitor present (daugter) Nurse Communication: Mobility status for transfers;Mobility  status for ambulation General Behavior During Session: [facility] for tasks performed Cognition: [facility] for tasks performed   Lucile Shutters 11/25/2011, 11:36 AM  Pager: [phone]

## 2011-11-26 NOTE — Progress Notes (Signed)
1600 Pt OOB in chair sleeping, very hard to arouse. Pt had been AA&OX4 since 7 am, worked with OT and had family visiting all [place] and pt was awake and talking to visitors and fell asleep just before they left.  Pt noted to be having symptoms of sleep apnea.  Pt stopped breathing a couple of times while I was standing there for a few seconds. 1640 Pt AA&OX4 sitting up in bed watching TV, No s/s of distress noted.  Will continue to monitor. A. Heatherly Stenner, RN

## 2011-11-26 NOTE — Progress Notes (Signed)
Utilization review completed. Antwon Rochin, RN, BSN. 11/26/11  

## 2011-11-26 NOTE — Progress Notes (Signed)
Occupational Therapy Treatment Patient Details Name: Charles Tapia MRN: [id number] DOB: 09-20-1918 Today's Date: 11/26/2011  OT Assessment/Plan OT Assessment/Plan Comments on Treatment Session: Pt recalled meeting therapist. Pt correctly reports granddaughter due to have baby in less than 4 weeks and the name of the new baby.  OT Plan: Discharge plan remains appropriate OT Frequency: Min 2X/week Recommendations for Other Services: Rehab consult Follow Up Recommendations: Inpatient Rehab Equipment Recommended: Defer to next venue OT Goals Acute Rehab OT Goals OT Goal Formulation: With patient/family Time For Goal Achievement: 2 weeks ADL Goals Pt Will Perform Grooming: with supervision;Sitting at sink;Unsupported ADL Goal: Grooming - Progress: Progressing toward goals Pt Will Perform Upper Body Bathing: with modified independence;Sitting, chair;Supported ADL Goal: Product manager - Progress: Progressing toward goals Pt Will Perform Lower Body Bathing: with supervision;Sit to stand from chair;Unsupported ADL Goal: Lower Body Bathing - Progress: Progressing toward goals Pt Will Perform Upper Body Dressing: with supervision;Sitting, chair;Unsupported ADL Goal: Upper Body Dressing - Progress: Progressing toward goals Pt Will Perform Lower Body Dressing: with supervision;Sit to stand from chair ADL Goal: Lower Body Dressing - Progress: Progressing toward goals Pt Will Transfer to Toilet: with min assist;3-in-1;Ambulation ADL Goal: Toilet Transfer - Progress: Progressing toward goals Pt Will Perform Toileting - Clothing Manipulation: with min assist;Sitting on 3-in-1 or toilet ADL Goal: Toileting - Clothing Manipulation - Progress: Progressing toward goals Pt Will Perform Toileting - Hygiene: with min assist;Sit to stand from 3-in-1/toilet ADL Goal: Toileting - Hygiene - Progress: Progressing toward goals  OT Treatment Precautions/Restrictions  Precautions Precautions:  Fall Restrictions Weight Bearing Restrictions: No   ADL ADL Grooming: Performed;Teeth care;Wash/dry face;Moderate assistance Grooming Details (indicate cue type and reason): Pt with posterior lean. Pt with Lt UE placed with palm side down on counter top and provided facilitation at hips for flexion to promote weight bearing over Lt UE. Pt recalled placing tooth paste in Lt UE and twist top off tooth paste with Rt Ue. Pt required max A and mod v/c to position tooth brush in Lt hand and apply tooth paste with Rt UE. Pt brushing teeth with posterior lean decreased due to hip flexion to weight bear on Lt UE. Pt brushing teeth with Min A. Pt static standing releasing with BIL UE ~15 seconds.  Where Assessed - Grooming: Standing at sink;Supported Location manager Dressing: Performed;Minimal assistance Upper Body Dressing Details (indicate cue type and reason): Pt don gown as robe. Pt with Min v/c to thread Lt Ue first through gown. Pt actively lifting Lt Ue to place into gown sleeve. Pt demonstrates hand grasp brunstrom III (gross grasp / MMT 3- out 5) Pt elbow flexion and extension 3- out 5 gravity eliminated plan. Pt with shoulder flexion 3 out 5 MMT. Pt demonstrates improved Lt UE strength and AAROM this session.  Where Assessed - Upper Body Dressing: Sitting, chair;Supported Equipment Used: Agricultural consultant (pt could benefit from RW splint) Ambulation Related to ADLs: Pt sit<>stand with hand over hand positioning to stabilize Lt Ue on arm rest Mod A. Pt using RW to walk to sink min A. Pt required A with RW when turning Lt side due to decreased strength in LE UE to move RW. Pt with wrist flexion attempting to hold RW thus could benefit from RW splint. Pt ambulated to sink for grooming task then out into the hall to welcome visitors back into the room. ADL Comments: see grooming comments Mobility  Bed Mobility Bed Mobility: No (in chair on arrival) Transfers Transfers: Yes Sit to  Stand: 3: Mod assist;With  upper extremity assist;With armrests;From chair/3-in-1 Stand to Sit: 4: Min assist;With armrests;With upper extremity assist;To chair/3-in-1 Exercises    End of Session OT - End of Session Equipment Utilized During Treatment: Gait belt Activity Tolerance: Patient tolerated treatment well Patient left: in chair;with call bell in reach;with family/visitor present (2 male visitors) Nurse Communication: Mobility status for transfers;Mobility status for ambulation General Behavior During Session: [facility] for tasks performed Cognition: [facility] for tasks performed (pt recalled OT and previous session)  Charles Tapia  11/26/2011, 1:12 PM Pager: [phone]

## 2011-11-26 NOTE — Progress Notes (Signed)
Physical Therapy Treatment Patient Details Name: Charles Tapia MRN: [id number] DOB: 1918/10/10 Today's Date: 11/26/2011  PT Assessment/Plan  PT - Assessment/Plan Comments on Treatment Session: Pt very lethargic and difficult to arouse, although after max cues (verbal, sternal rub, cold washcloth) pt oriented x 4 and able to move LLE to command.  Treatment limited to transfer due to fatigue. PT Plan: Discharge plan remains appropriate;Frequency remains appropriate PT Frequency: Min 4X/week Follow Up Recommendations: Inpatient Rehab Equipment Recommended: Defer to next venue PT Goals  Acute Rehab PT Goals PT Goal: Sit to Supine/Side - Progress: Not progressing PT Goal: Sit to Stand - Progress: Not progressing PT Goal: Stand to Sit - Progress: Not progressing PT Transfer Goal: Bed to Chair/Chair to Bed - Progress: Not progressing  PT Treatment Precautions/Restrictions  Precautions Precautions: Fall Restrictions Weight Bearing Restrictions: No Mobility (including Balance) Bed Mobility Bed Mobility: No (in chair on arrival) Sit to Supine: 3: Mod assist;HOB flat Sit to Supine - Details (indicate cue type and reason): assist to legs; pt very fatigued/lethargic Transfers Sit to Stand: 1: +2 Total assist;Patient percentage (comment);With upper extremity assist;From chair/3-in-1 Sit to Stand Details (indicate cue type and reason): +2 for safety due to pt fatigued/lethargic; pt= 70% Stand to Sit: 1: +2 Total assist;Patient percentage (comment);With upper extremity assist Stand to Sit Details: pt=70% Ambulation/Gait Ambulation/Gait: No (too fatigued; per RN up all day and lots of visitors)    Exercise    End of Session PT - End of Session Equipment Utilized During Treatment: Gait belt Activity Tolerance: Patient limited by fatigue (sleeping in chair on arrival; MAX cues to arouse) Patient left: in bed;with call bell in reach;with bed alarm set Nurse Communication: Mobility status for  transfers;Other (comment) (difficult to arouse; ultimately oriented x4, just very sleep) General Behavior During Session: Lethargic Cognition: [facility] for tasks performed  Charles Tapia 11/26/2011, 4:33 PM Pager [phone]

## 2011-11-26 NOTE — Progress Notes (Signed)
Stroke Team Progress Note  HISTORY Charles Tapia is an 78 y.o. male who reports that when he went to bed 11/21/2011 he was feeling at his baseline. He awakened in the middle of the night to go to the bathroom and noted that he was unable to control his left side. He did not report any numbness on the left. Initial NIHSS of 4. He lived with his wife with dementia prior to admission. He provided care for her. Family isconsideringhiring someone to help care for both of them in the future.  SUBJECTIVE His daughter is at the bedside. Overall he feels his condition is stable. Family has decided on inpatient rehab.   OBJECTIVE Most recent Vital Signs: Temp: 98 F (36.7 C) (01/23 0500) Temp src: Oral (01/23 0500) BP: 113/67 mmHg (01/23 0500) Pulse Rate: 71  (01/23 0500) Respiratory Rate: 18 O2 Saturation: 90%  CBG (last 3) No results found for this basename: GLUCAP:3 in the last 72 hours Intake/Output from previous day: 01/22 0701 - 01/23 0700 In: -  Out: 1250 [Urine:1250]  IV Fluid Intake:    Medications     . allopurinol  300 mg Oral Daily  . calcium carbonate  1 tablet Oral Daily  . enalapril  20 mg Oral Daily  . metoprolol succinate  50 mg Oral Daily  . omega-3 acid ethyl esters  1 g Oral BID  . pantoprazole  40 mg Oral Q0600  . polyethylene glycol  17 g Oral Daily  . senna-docusate  1 tablet Oral BID  . simvastatin  20 mg Oral Daily  . Tamsulosin HCl  0.4 mg Oral Daily  PRN acetaminophen, acetaminophen, labetalol, traMADol  Diet:  Cardiac thin liquids Activity:   Up with assistance DVT Prophylaxis:  SCDs   Studies: CBC    Component Value Date/Time   WBC 8.4 11/22/2011 0400   RBC 3.51* 11/22/2011 0400   HGB 12.0* 11/22/2011 0400   HCT 35.4* 11/22/2011 0400   PLT 161 11/22/2011 0400   MCV 100.9* 11/22/2011 0400   MCH 34.2* 11/22/2011 0400   MCHC 33.9 11/22/2011 0400   RDW 13.7 11/22/2011 0400   LYMPHSABS 2.8 11/22/2011 0400   MONOABS 0.7 11/22/2011 0400   EOSABS 0.1 11/22/2011  0400   BASOSABS 0.0 11/22/2011 0400   CMP    Component Value Date/Time   NA 137 11/22/2011 0400   K 4.9 11/22/2011 0400   CL 104 11/22/2011 0400   CO2 23 11/22/2011 0400   GLUCOSE 106* 11/22/2011 0400   BUN 34* 11/22/2011 0400   CREATININE 1.26 11/22/2011 0400   CALCIUM 9.6 11/22/2011 0400   PROT 6.2 11/22/2011 0400   ALBUMIN 3.5 11/22/2011 0400   AST 22 11/22/2011 0400   ALT 14 11/22/2011 0400   ALKPHOS 73 11/22/2011 0400   BILITOT 0.5 11/22/2011 0400   GFRNONAA 48* 11/22/2011 0400   GFRAA 56* 11/22/2011 0400   COAGS Lab Results  Component Value Date   INR 1.05 11/22/2011   INR 4.62* 11/22/2011   Lipid Panel No results found for this basename: chol,  trig,  hdl,  cholhdl,  vldl,  ldlcalc   HgbA1C  No results found for this basename: HGBA1C   Urine Drug Screen  No results found for this basename: labopia,  cocainscrnur,  labbenz,  amphetmu,  thcu,  labbarb    Alcohol Level No results found for this basename: eth   CT of the brain   Stable small area of hemorrhage in the right basal ganglia.  MRI of the brain   9 x 11 x 14 mm right posterior putaminal intracerebral hemorrhage with mild surrounding edema. No significant mass effect or midline shift.  Atrophy with chronic microvascular ischemic change.   MRA of the brain  Not ordered   2D Echocardiogram  estimated ejection fraction was in the range of 50% to 55%. Contraction pattern was dyssynchronous with hypokinesis of the inferoseptal myocardium.  Carotid Doppler  No internal carotid artery stenosis bilaterally. Vertebrals with antegrade flow bilaterally.   Transcranial Doppler Low mean flow velocities throughout likely due to technical difficulties from poor windows throughout. Globally elevated pulsatility indices suggest diffuse intracranial atherosclerosis.  CXR  Not ordered   EKG  normal sinus rhythm.   Physical Exam  awake alert.Afebrile. Head is nontraumatic. Neck is supple without carotid bruit. Hearing is slightly  decreased bilaterally. Cardiac exam ejection murmur no gallop. Lungs clear to auscultation. Abdomen soft nontender.  Neurological exam awake, alert, oriented x3 with normal speech and language function. Mild dysarthria but can be understood easily. Extraocular movements are full range without nystagmus. Visual fields are full to confrontational testing. There is mild left lower facial weakness. Tongue is midline. Motor system exam reveals left upper extremity drift with the hand hitting the bed. There is grade 2-3 strength in the left upper extremity and 4+/5 strength in the left lower extremity.mild weakness of left grip and hand muscles. Sensation appears intact. Left is upgoing right is downgoing. Gait was not tested.   ASSESSMENT [name] is a 78 y.o. male with a right basal ganglia hemorrhage, secondary to hypertension. Not on antiplatelets secondary to hemorrhage. Therapy recommends inpatient rehab. Medically stable for discharge. Await rehab insurance approval and bed.  Stroke risk factors:  hyperlipidemia, hypertension and CABG  Hospital day # 4  TREATMENT/PLAN Await rehab bed.  Joaquin Music, ANP-BC, GNP-BC [facility] Pager: [phone] 11/26/2011 8:19 AM  Dr. Delia Heady, Stroke Center Medical Director, has personally reviewed chart, pertinent data, examined the patient and developed the plan of care.

## 2011-11-27 ENCOUNTER — Inpatient Hospital Stay (HOSPITAL_COMMUNITY)
Admission: RE | Admit: 2011-11-27 | Discharge: 2011-12-10 | DRG: 945 | Disposition: A | Discharge status: Home Health | Payer: Medicare Other | Source: Ambulatory Visit | Attending: Physical Medicine & Rehabilitation | Admitting: Physical Medicine & Rehabilitation

## 2011-11-27 DIAGNOSIS — I619 Nontraumatic intracerebral hemorrhage, unspecified: Secondary | ICD-10-CM

## 2011-11-27 DIAGNOSIS — I169 Hypertensive crisis, unspecified: Secondary | ICD-10-CM | POA: Diagnosis present

## 2011-11-27 DIAGNOSIS — E875 Hyperkalemia: Secondary | ICD-10-CM | POA: Diagnosis present

## 2011-11-27 DIAGNOSIS — I2510 Atherosclerotic heart disease of native coronary artery without angina pectoris: Secondary | ICD-10-CM | POA: Diagnosis present

## 2011-11-27 DIAGNOSIS — N179 Acute kidney failure, unspecified: Secondary | ICD-10-CM | POA: Diagnosis present

## 2011-11-27 DIAGNOSIS — Z950 Presence of cardiac pacemaker: Secondary | ICD-10-CM

## 2011-11-27 DIAGNOSIS — I10 Essential (primary) hypertension: Secondary | ICD-10-CM | POA: Diagnosis present

## 2011-11-27 DIAGNOSIS — M5126 Other intervertebral disc displacement, lumbar region: Secondary | ICD-10-CM | POA: Diagnosis present

## 2011-11-27 DIAGNOSIS — K5900 Constipation, unspecified: Secondary | ICD-10-CM | POA: Diagnosis present

## 2011-11-27 DIAGNOSIS — G8110 Spastic hemiplegia affecting unspecified side: Secondary | ICD-10-CM

## 2011-11-27 DIAGNOSIS — I959 Hypotension, unspecified: Secondary | ICD-10-CM | POA: Diagnosis present

## 2011-11-27 DIAGNOSIS — I059 Rheumatic mitral valve disease, unspecified: Secondary | ICD-10-CM | POA: Diagnosis present

## 2011-11-27 DIAGNOSIS — Z5189 Encounter for other specified aftercare: Secondary | ICD-10-CM

## 2011-11-27 DIAGNOSIS — E785 Hyperlipidemia, unspecified: Secondary | ICD-10-CM | POA: Diagnosis present

## 2011-11-27 DIAGNOSIS — I629 Nontraumatic intracranial hemorrhage, unspecified: Secondary | ICD-10-CM | POA: Diagnosis present

## 2011-11-27 DIAGNOSIS — Z951 Presence of aortocoronary bypass graft: Secondary | ICD-10-CM

## 2011-11-27 DIAGNOSIS — R471 Dysarthria and anarthria: Secondary | ICD-10-CM | POA: Diagnosis present

## 2011-11-27 LAB — URINALYSIS, ROUTINE W REFLEX MICROSCOPIC
Nitrite: NEGATIVE
Specific Gravity, Urine: 1.013 (ref 1.005–1.030)
pH: 5.5 (ref 5.0–8.0)

## 2011-11-27 LAB — URINE MICROSCOPIC-ADD ON

## 2011-11-27 MED ORDER — GUAIFENESIN-DM 100-10 MG/5ML PO SYRP
5.0000 mL | ORAL_SOLUTION | Freq: Four times a day (QID) | ORAL | Status: DC | PRN
  Administered 2011-12-02: 10 mL via ORAL
  Administered 2011-12-03 – 2011-12-04 (×3): 5 mL via ORAL
  Administered 2011-12-06: 10 mL via ORAL
  Administered 2011-12-06: 5 mL via ORAL
  Administered 2011-12-08: 10 mL via ORAL
  Administered 2011-12-08: 5 mL via ORAL
  Filled 2011-11-27 (×2): qty 10
  Filled 2011-11-27: qty 5
  Filled 2011-11-27 (×3): qty 10

## 2011-11-27 MED ORDER — MUSCLE RUB 10-15 % EX CREA
TOPICAL_CREAM | Freq: Four times a day (QID) | CUTANEOUS | Status: DC
  Administered 2011-11-28 – 2011-12-10 (×43): via TOPICAL
  Filled 2011-11-27 (×2): qty 85

## 2011-11-27 MED ORDER — DIPHENHYDRAMINE HCL 12.5 MG/5ML PO ELIX
12.5000 mg | ORAL_SOLUTION | Freq: Four times a day (QID) | ORAL | Status: DC | PRN
  Filled 2011-11-27: qty 10

## 2011-11-27 MED ORDER — METOPROLOL SUCCINATE ER 50 MG PO TB24
50.0000 mg | ORAL_TABLET | Freq: Every day | ORAL | Status: DC
  Administered 2011-11-28 – 2011-12-05 (×8): 50 mg via ORAL
  Filled 2011-11-27 (×10): qty 1

## 2011-11-27 MED ORDER — MUSCLE RUB 10-15 % EX CREA
TOPICAL_CREAM | Freq: Four times a day (QID) | CUTANEOUS | Status: DC
  Filled 2011-11-27: qty 85

## 2011-11-27 MED ORDER — FLEET ENEMA 7-19 GM/118ML RE ENEM
1.0000 | ENEMA | Freq: Once | RECTAL | Status: AC | PRN
  Filled 2011-11-27: qty 1

## 2011-11-27 MED ORDER — TAMSULOSIN HCL 0.4 MG PO CAPS
0.4000 mg | ORAL_CAPSULE | Freq: Every day | ORAL | Status: DC
  Administered 2011-11-28 – 2011-12-03 (×6): 0.4 mg via ORAL
  Filled 2011-11-27 (×7): qty 1

## 2011-11-27 MED ORDER — PROMETHAZINE HCL 12.5 MG RE SUPP: 12.5000 mg | Freq: Four times a day (QID) | RECTAL | Status: DC | PRN

## 2011-11-27 MED ORDER — ALLOPURINOL 300 MG PO TABS
300.0000 mg | ORAL_TABLET | Freq: Every day | ORAL | Status: DC
  Administered 2011-11-28 – 2011-12-10 (×13): 300 mg via ORAL
  Filled 2011-11-27 (×14): qty 1

## 2011-11-27 MED ORDER — SIMVASTATIN 20 MG PO TABS
20.0000 mg | ORAL_TABLET | Freq: Every day | ORAL | Status: DC
  Administered 2011-11-27 – 2011-12-09 (×13): 20 mg via ORAL
  Filled 2011-11-27 (×14): qty 1

## 2011-11-27 MED ORDER — ALUM & MAG HYDROXIDE-SIMETH 200-200-20 MG/5ML PO SUSP: 30.0000 mL | ORAL | Status: DC | PRN

## 2011-11-27 MED ORDER — MENTHOL (TOPICAL ANALGESIC) 4 % EX GEL: 1.0000 "application " | Freq: Four times a day (QID) | CUTANEOUS | Status: DC

## 2011-11-27 MED ORDER — BISACODYL 10 MG RE SUPP: 10.0000 mg | Freq: Every day | RECTAL | Status: DC | PRN

## 2011-11-27 MED ORDER — METHOCARBAMOL 500 MG PO TABS
500.0000 mg | ORAL_TABLET | Freq: Four times a day (QID) | ORAL | Status: DC | PRN
  Administered 2011-11-28 – 2011-11-30 (×2): 500 mg via ORAL
  Filled 2011-11-27 (×2): qty 1

## 2011-11-27 MED ORDER — NON FORMULARY: 1.0000 "application " | Freq: Four times a day (QID) | Status: DC

## 2011-11-27 MED ORDER — ENALAPRIL MALEATE 20 MG PO TABS
20.0000 mg | ORAL_TABLET | Freq: Every day | ORAL | Status: DC
  Administered 2011-11-28: 20 mg via ORAL
  Filled 2011-11-27 (×2): qty 1

## 2011-11-27 MED ORDER — PROMETHAZINE HCL 12.5 MG PO TABS: 12.5000 mg | ORAL_TABLET | Freq: Four times a day (QID) | ORAL | Status: DC | PRN

## 2011-11-27 MED ORDER — ACETAMINOPHEN 500 MG PO TABS
500.0000 mg | ORAL_TABLET | Freq: Three times a day (TID) | ORAL | Status: DC
  Administered 2011-11-27 – 2011-12-10 (×38): 500 mg via ORAL
  Filled 2011-11-27 (×41): qty 1

## 2011-11-27 MED ORDER — OMEGA-3-ACID ETHYL ESTERS 1 G PO CAPS
1.0000 g | ORAL_CAPSULE | Freq: Two times a day (BID) | ORAL | Status: DC
  Administered 2011-11-27 – 2011-12-10 (×26): 1 g via ORAL
  Filled 2011-11-27 (×29): qty 1

## 2011-11-27 MED ORDER — PROMETHAZINE HCL 25 MG/ML IJ SOLN: 12.5000 mg | Freq: Four times a day (QID) | INTRAMUSCULAR | Status: DC | PRN

## 2011-11-27 MED ORDER — TRAMADOL HCL 50 MG PO TABS
50.0000 mg | ORAL_TABLET | Freq: Four times a day (QID) | ORAL | Status: DC | PRN
  Administered 2011-11-29 – 2011-12-09 (×4): 50 mg via ORAL
  Filled 2011-11-27 (×4): qty 1

## 2011-11-27 MED ORDER — MUSCLE RUB 10-15 % EX CREA: TOPICAL_CREAM | Freq: Four times a day (QID) | CUTANEOUS | Status: DC

## 2011-11-27 MED ORDER — TRAZODONE HCL 50 MG PO TABS
25.0000 mg | ORAL_TABLET | Freq: Every evening | ORAL | Status: DC | PRN
  Administered 2011-12-05: 25 mg via ORAL
  Filled 2011-11-27: qty 1

## 2011-11-27 MED ORDER — SENNOSIDES-DOCUSATE SODIUM 8.6-50 MG PO TABS: 1.0000 | ORAL_TABLET | Freq: Every day | ORAL | Status: DC

## 2011-11-27 MED ORDER — PANTOPRAZOLE SODIUM 40 MG PO TBEC
40.0000 mg | DELAYED_RELEASE_TABLET | Freq: Every day | ORAL | Status: DC
  Administered 2011-11-28 – 2011-12-10 (×14): 40 mg via ORAL
  Filled 2011-11-27 (×15): qty 1

## 2011-11-27 MED ORDER — CALCIUM CARBONATE 1250 (500 CA) MG PO TABS
1.0000 | ORAL_TABLET | Freq: Every day | ORAL | Status: DC
  Administered 2011-11-27 – 2011-12-10 (×14): 500 mg via ORAL
  Filled 2011-11-27 (×15): qty 1

## 2011-11-27 MED ORDER — POLYETHYLENE GLYCOL 3350 17 G PO PACK
17.0000 g | PACK | Freq: Every day | ORAL | Status: DC
  Administered 2011-11-28 – 2011-12-10 (×13): 17 g via ORAL
  Filled 2011-11-27 (×14): qty 1

## 2011-11-27 NOTE — PMR Pre-admission (Signed)
PMR Admission Coordinator Pre-Admission Assessment  Patient:  Charles Tapia is an 78 y.o., male MRN:  [id number] DOB:  1918/02/23 Height:  5\' 8"  (172.7 cm) Weight:  65.772 kg (145 lb)  Insurance Information: HMO:yes    PPO:      PCP:      IPA:      80/20:      OTHER:Group # [id number] PRIMARY:[employer]      Policy#:[id number]      Subscriber:[name] CM Name:Shannon Lamott      Phone#:[phone]     WUJ#:[id number] Pre-Cert#:approved - # to follow      Employer:Retired Benefits:  Phone #:[phone]     Name:Lorainne Eff. 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  CT head with periventricular hemorrhage.  Needed cardene drip fro BP control.  MRI with right putaminal ICH with mild surrounding edema.     Patients Past Medical History:   Past Medical History  Diagnosis Date  . Hypertension   . Cardiomyopathy   . CAD (coronary artery disease)   . Gout   . Hyperlipidemia   . Mitral regurgitation    Family Medical History:  family history includes Diabetes in his son. NIH Stroke scale: Total: 6  Patients Current Diet: Cardiac  Prior Rehab/Hospitalizations: Had HH therapies after back surgery 6 yrs ago.  Current Medications: Current facility-administered medications:acetaminophen (TYLENOL) suppository 650 mg, 650 mg, Rectal, Q4H PRN, Pola Corn, MD;  acetaminophen (TYLENOL) tablet 650 mg, 650 mg, Oral, Q4H PRN, Pola Corn, MD, 650 mg at 11/27/11 1005;  allopurinol (ZYLOPRIM) tablet 300 mg, 300 mg, Oral, Daily, Pola Corn, MD, 300 mg at 11/27/11 1006 calcium carbonate (OS-CAL - dosed in mg of elemental calcium) tablet 500 mg of elemental calcium, 1 tablet, Oral, Daily, Janice Coffin, RPH, 500 mg of elemental calcium at 11/27/11 1006;  enalapril (VASOTEC) tablet 20 mg, 20 mg, Oral, Daily, Carmell Austria, MD, 20 mg at 11/27/11 1006;  labetalol (NORMODYNE,TRANDATE) injection 5 mg, 5 mg, Intravenous, Q2H PRN, Carmell Austria, MD, 5 mg at 11/26/11 0303 metoprolol succinate (TOPROL-XL) 24 hr tablet 50 mg, 50 mg, Oral, Daily, Pola Corn, MD, 50 mg at 11/27/11 1006;  omega-3 acid ethyl esters (LOVAZA) capsule 1 g, 1 g, Oral, BID, Janice Coffin, RPH, 1 g at 11/27/11 1007;  pantoprazole (PROTONIX) EC tablet 40 mg, 40 mg, Oral, Q0600, Tara C Jernejcic, PA, 40 mg at 11/27/11 0755 polyethylene glycol (MIRALAX / GLYCOLAX) packet 17 g, 17 g, Oral, Daily, Pola Corn, MD, 17 g at 11/27/11 1006;  senna-docusate (Senokot-S) tablet 1 tablet, 1 tablet, Oral, BID, Pola Corn, MD, 1 tablet at 11/27/11 1006;  simvastatin (ZOCOR) tablet 20 mg, 20 mg, Oral, Daily, Pola Corn, MD, 20 mg at 11/27/11 1007;  Tamsulosin HCl (FLOMAX) capsule 0.4 mg, 0.4 mg, Oral, Daily, Pola Corn, MD, 0.4 mg at 11/27/11 1006 traMADol (ULTRAM) tablet 50-100 mg, 50-100 mg, Oral, Q6H PRN, Pola Corn,  MD, 50 mg at 11/24/11 1610  Precautions/Special Needs:   Very hard of hearing and wears B hearing aides.  Had a foley catheter as of 11/27/11.  Last BM 11/27/11.  Additional Precautions/Restrictions: Precautions Precautions: Fall Restrictions Weight Bearing Restrictions: No  Therapy Assessments Physical Therapy: Precautions Precautions: Fall Home Living Lives With: Spouse (wife w/c bound with alzheimers) Receives Help From: Family;Personal care attendant Type of Home: House Home Layout: One level Home Access: Ramped entrance Bathroom Shower/Tub: Tub/shower unit;Walk-in shower Bathroom Toilet:  Handicapped height Bathroom Accessibility: Yes How Accessible: Accessible via wheelchair Home Adaptive Equipment: Bedside commode/3-in-1;Straight cane;Tub transfer bench;Walker - rolling;Shower chair with back Additional Comments: 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 Pt would assist with bed pan toileting at night time for wife. 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 pt able to pronate and supinate with extended time and visually attending to task. pt with decreased MMT 2- out 5 shoulder flexion. pt with decrease grasp brunstrom II (lateral pinch with thumb) pt unable to achieve digit flexion 2-5 digits Heel Shin Test: [facility]  Occupational Therapy: Precautions Precautions: Fall Home Living Lives With: Spouse (wife w/c bound with alzheimers) Receives Help From: Family;Personal care attendant Type of Home: House Home Layout: One level Home Access: Ramped entrance Bathroom Shower/Tub: Tub/shower unit;Walk-in shower Bathroom Toilet: Handicapped height Bathroom Accessibility: Yes How Accessible: Accessible via wheelchair Home Adaptive Equipment: Bedside commode/3-in-1;Straight cane;Tub transfer bench;Walker - rolling;Shower chair with back Additional Comments: 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 Pt would assist with bed pan toileting at night time for wife. 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 pt able to pronate and supinate with  extended time and visually attending to task. pt with decreased MMT 2- out 5 shoulder flexion. pt with decrease grasp brunstrom II (lateral pinch with thumb) pt unable to achieve digit flexion 2-5 digits Heel Shin Test: [facility] Restrictions Weight Bearing Restrictions: No ADL Eating/Feeding: Performed;Set up Eating/Feeding Details (indicate cue type and reason): pt with orange spilled on pt and on the tray. Pt demonstrates decreased grasp and lack of awareness of Lt UE location Where Assessed - Eating/Feeding: Chair Grooming: Performed;Teeth care;Wash/dry face;Moderate assistance Grooming Details (indicate cue type and reason): Pt with posterior lean. Pt with Lt UE placed with palm side down on counter top and provided facilitation at hips for flexion to promote weight bearing over Lt UE. Pt recalled placing tooth paste in Lt UE and twist top off tooth paste with Rt Ue. Pt required max A and mod v/c to position tooth brush in Lt hand and apply tooth paste with Rt UE. Pt brushing teeth with posterior lean decreased due to hip flexion to weight bear on Lt UE. Pt brushing teeth with Min A. Pt static standing releasing with BIL UE ~15 seconds.  Where Assessed - Grooming: Standing at sink;Supported Location manager Dressing: Performed;Minimal assistance Upper Body Dressing Details (indicate cue type and reason): Pt don gown as robe. Pt with Min v/c to thread Lt Ue first through gown. Pt actively lifting Lt Ue to place into gown sleeve. Pt demonstrates hand grasp  brunstrom III (gross grasp / MMT 3- out 5) Pt elbow flexion and extension 3- out 5 gravity eliminated plan. Pt with shoulder flexion 3 out 5 MMT. Pt demonstrates improved Lt UE strength and AAROM this session.  Where Assessed - Upper Body Dressing: Sitting, chair;Supported Equipment Used: Agricultural consultant (pt could benefit from RW splint) Ambulation Related to ADLs: Pt sit<>stand with hand over hand positioning to stabilize Lt Ue on arm rest Mod A. Pt using RW  to walk to sink min A. Pt required A with RW when turning Lt side due to decreased strength in LE UE to move RW. Pt with wrist flexion attempting to hold RW thus could benefit from RW splint. Pt ambulated to sink for grooming task then out into the hall to welcome visitors back into the room. ADL Comments: see grooming comments  SLP Recommendations: Recommendations for Other Services: Rehab consult Equipment Recommended: Defer to next venue Recommendations for Other Services: Rehab consult  Prior Function: Level of Independence: Independent with basic ADLs;Requires assistive device for independence Driving: Yes Vocation: Retired ADL Eating/Feeding: Performed;Set up Eating/Feeding Details (indicate cue type and reason): pt with orange spilled on pt and on the tray. Pt demonstrates decreased grasp and lack of awareness of Lt UE location Where Assessed - Eating/Feeding: Chair Grooming: Performed;Teeth care;Wash/dry face;Moderate assistance Grooming Details (indicate cue type and reason): Pt with posterior lean. Pt with Lt UE placed with palm side down on counter top and provided facilitation at hips for flexion to promote weight bearing over Lt UE. Pt recalled placing tooth paste in Lt UE and twist top off tooth paste with Rt Ue. Pt required max A and mod v/c to position tooth brush in Lt hand and apply tooth paste with Rt UE. Pt brushing teeth with posterior lean decreased due to hip flexion to weight bear on Lt UE. Pt brushing teeth with Min A. Pt static standing releasing with BIL UE ~15 seconds.  Where Assessed - Grooming: Standing at sink;Supported Location manager Dressing: Performed;Minimal assistance Upper Body Dressing Details (indicate cue type and reason): Pt don gown as robe. Pt with Min v/c to thread Lt Ue first through gown. Pt actively lifting Lt Ue to place into gown sleeve. Pt demonstrates hand grasp brunstrom III (gross grasp / MMT 3- out 5) Pt elbow flexion and extension 3- out 5  gravity eliminated plan. Pt with shoulder flexion 3 out 5 MMT. Pt demonstrates improved Lt UE strength and AAROM this session.  Where Assessed - Upper Body Dressing: Sitting, chair;Supported Equipment Used: Agricultural consultant (pt could benefit from RW splint) Ambulation Related to ADLs: Pt sit<>stand with hand over hand positioning to stabilize Lt Ue on arm rest Mod A. Pt using RW to walk to sink min A. Pt required A with RW when turning Lt side due to decreased strength in LE UE to move RW. Pt with wrist flexion attempting to hold RW thus could benefit from RW splint. Pt ambulated to sink for grooming task then out into the hall to welcome visitors back into the room. 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 Pt demonstrates decreased grasp and lack of awareness of Lt UE location Where Assessed - Eating/Feeding: Chair Grooming: Performed;Teeth care;Wash/dry face;Moderate assistance Grooming Details (indicate cue type and reason): Pt with posterior lean. Pt with Lt UE placed with palm side down on counter top and provided facilitation at hips for flexion to promote weight bearing over Lt UE. Pt recalled placing tooth paste in Lt UE and twist top off tooth paste with Rt Ue. Pt required max A and mod v/c to position tooth brush in Lt hand and apply tooth paste with Rt UE. Pt  brushing teeth with posterior lean decreased due to hip flexion to weight bear on Lt UE. Pt brushing teeth with Min A. Pt static standing releasing with BIL UE ~15 seconds.  Where Assessed - Grooming: Standing at sink;Supported Location manager Dressing: Performed;Minimal assistance Upper Body Dressing Details (indicate cue type and reason): Pt don gown as robe. Pt with Min v/c to thread Lt Ue first through gown. Pt actively lifting Lt Ue to place into gown sleeve. Pt demonstrates hand grasp brunstrom III (gross grasp / MMT 3- out 5) Pt elbow flexion and extension 3- out 5 gravity eliminated plan. Pt with shoulder flexion 3 out 5 MMT. Pt demonstrates improved Lt UE strength and AAROM this session.  Where Assessed - Upper Body Dressing: Sitting, chair;Supported Equipment Used: Agricultural consultant (pt could benefit from RW splint) Ambulation Related to ADLs: Pt sit<>stand with hand over hand positioning to stabilize Lt Ue on arm rest Mod A. Pt using RW to walk to sink min A. Pt required A with RW when turning Lt side due to decreased strength in LE UE to move RW. Pt with wrist flexion attempting to hold RW thus could benefit from RW splint. Pt ambulated to sink for grooming task then out into the hall to welcome visitors back into the room. ADL Comments: see grooming comments  Bed Mobility Bed Mobility: No (in chair on arrival) Supine to Sit: 3: Mod assist;With rails;HOB elevated (Comment degrees) (30 degrees) Supine to Sit Details (indicate cue type and reason): (A) to initiate mobility with max cues for technique with (A) to elevate trunk and advance LE to EOB Sitting - Scoot to Edge of Bed: 2: Max assist;With rail Sitting - Scoot to [place] of Bed Details (indicate cue type and reason): (A) for proper weight shifts to advance hips to EOB.  Pt tends to lean posterior during scooting Sit to Supine: 3: Mod assist;HOB flat Sit to Supine - Details (indicate cue type and reason): assist to legs; pt very  fatigued/lethargic Transfers Transfers: Yes Sit to Stand: 1: +2 Total assist;Patient percentage (comment);With upper extremity assist;From chair/3-in-1 Sit to Stand Details (indicate cue type and reason): +2 for safety due to pt fatigued/lethargic; pt= 70% Stand to Sit: 1: +2 Total assist;Patient percentage (comment);With upper extremity assist Stand to Sit Details: pt=70% Ambulation/Gait Ambulation/Gait: No (too fatigued; per RN up all day and lots of visitors) Ambulation/Gait Assistance: 4: Min assist Ambulation/Gait Assistance Details (indicate cue type and reason): cues and A for L hand on RW and steering RW Ambulation Distance (Feet): 100 Feet Assistive device: Rolling walker Gait Pattern: Step-through pattern;Decreased step length - left Stairs: No Wheelchair Mobility Wheelchair Mobility: No Posture/Postural Control Posture/Postural Control: No significant limitations Balance Balance Assessed: No Static Sitting Balance Static Sitting - Balance Support: Feet supported Static  Sitting - Level of Assistance: 4: Min assist Static Sitting - Comment/# of Minutes: Intermittent min (A) to maintain balance secondary to posterior lean with posterior pelvic tilt. Dynamic Sitting Balance Dynamic Sitting - Balance Support: Feet unsupported Dynamic Sitting - Level of Assistance: 3: Mod assist Dynamic Sitting Balance - Compensations: Intermittent mod (A) with severe posterior lean when attempting MMT on bilateral LE Static Standing Balance Static Standing - Level of Assistance: 3: Mod assist Static Standing - Comment/# of Minutes: ~5 minutes standing with posterior lean.  Max cues for midline and upright posture.  Pt tends to push posterior with further input to stand upright.  Home Assistive Devices/Equipment:  Home Assistive Devices/Equipment Home Assistive Devices/Equipment: Other (Comment) (cane)  Discharge Planning:  Living Arrangements: Spouse/significant other Support Systems:  Spouse/significant other;Children Do you have any problems obtaining your medications?: No Type of Residence: Private residence Home Care Services: No (pt's wife has it) Case Management Consult Needed: No  Previous Home Environment:  Living Arrangements: Spouse/significant other Support Systems: Spouse/significant other;Children Do you have any problems obtaining your medications?: No Type of Residence: Private residence Home Care Services: No (pt's wife has it)  Discharge Living Setting:  Plans for Discharge Living Setting: Patient's home;House;Lives with (comment) (Lives with wife and was caregiver for wife who is w/c bound) Discharge Living Setting Number of Levels: 1 Discharge Living Setting Number of Steps: Ramp front and 2 steps at back entrance Discharge Living Setting is Bedroom on Main Floor?: Yes Discharge Living Setting is Bathroom on Main Floor?: Yes  Social/Family/Support Systems:  Patient Roles: Spouse;Parent;Caregiver Contact Information: Vihaan Gloss (h) [phone] (c) [phone]  Magda Bernheim (c) [phone] Elzie Knisley -son (h) [phone] ([phone]) Anticipated Caregiver: Hired caregivers, sons and dtr on weekends (Has caregiver 8-3, hiring 12 hours caregivers to assist) Ability/Limitations of Caregiver: Sons work, but help in evenings, Dtr out-of-town helps weekends Caregiver Availability: 24/7 Discharge Plan Discussed with Primary Caregiver: Yes Is Caregiver In Agreement with Plan?: Yes Does Caregiver/Family have Issues with Lodging/Transportation while Pt is in Rehab?: No  Goals/Additional Needs:  Patient/Family Goal for Rehab: PT S, OT S/Min A, ST S/Mod I goals (ELOS = 2-3 weeks) Cultural Considerations: [employer] Dietary Needs: Heart Healthy Equipment Needs: TBD Pt/Family Agrees to Admission and willing to participate: Yes Program Orientation Provided & Reviewed with Pt/Caregiver Including Roles  & Responsibilities: Yes  To follow up with Dr. Pearlean Brownie in 2  months.  Preadmission Screen Completed By:  Trish Mage, 11/27/2011 12:27 PM  Patient's condition:  Please see physician update to information in consult dated 11/24/11.  Preadmission Screen Competed by: Roderic Palau, RN, Time/Date,1244/11/26/13.  Discussed status with Dr. Wynn Banker at 1246 on 11/27/11 (time/date) and received telephone approval for admission today.  Admission Coordinator:  Trish Mage, time1247/Date01/24/13

## 2011-11-27 NOTE — Progress Notes (Signed)
Patient is alert and oriented, hard of hearing requiring hearing aids in both ears.  Complaints of mild pain in left hip, is on scheduled tylenol.  Patient takes meds crushed in applesauce, no evidence of coughing or difficulty swallowing. Fed himself for supper. Patient currently has a condom cath in place.  Continent of bowel per report, LBM 11/27/11.  Left arm is very weak, 1+assist with rolling walker.

## 2011-11-27 NOTE — Progress Notes (Signed)
Rehab admissions - I have approval and can admit to acute inpatient rehab today.  Patient and daughter are in agreement.  Pager [phone]

## 2011-11-27 NOTE — Progress Notes (Signed)
Stroke Team Progress Note  HISTORY Leshaun Biebel is an 78 y.o. male who reports that when he went to bed 11/21/2011 he was feeling at his baseline. He awakened in the middle of the night to go to the bathroom and noted that he was unable to control his left side. He did not report any numbness on the left. Initial NIHSS of 4. He lived with his wife with dementia prior to admission. He provided care for her. Family isconsideringhiring someone to help care for both of them in the future.  SUBJECTIVE His daughter is at the bedside, speaking to rehab admissions coordinator. Overall he feels his condition is stable. Family has decided on inpatient rehab. Await insurance approval for transfer.  OBJECTIVE Most recent Vital Signs: Temp: 97.3 F (36.3 C) (01/24 0926) Temp src: Oral (01/24 0926) BP: 133/63 mmHg (01/24 0926) Pulse Rate: 67  (01/24 0926) Respiratory Rate: 16 O2 Saturation: 98%  CBG (last 3) No results found for this basename: GLUCAP:3 in the last 72 hours Intake/Output from previous day: 01/23 0701 - 01/24 0700 In: -  Out: 750 [Urine:750]  IV Fluid Intake:    Medications    . allopurinol  300 mg Oral Daily  . calcium carbonate  1 tablet Oral Daily  . enalapril  20 mg Oral Daily  . metoprolol succinate  50 mg Oral Daily  . omega-3 acid ethyl esters  1 g Oral BID  . pantoprazole  40 mg Oral Q0600  . polyethylene glycol  17 g Oral Daily  . senna-docusate  1 tablet Oral BID  . simvastatin  20 mg Oral Daily  . Tamsulosin HCl  0.4 mg Oral Daily  PRN acetaminophen, acetaminophen, labetalol, traMADol  Diet:  Cardiac thin liquids Activity:   Up with assistance DVT Prophylaxis:  SCDs   Studies: CBC    Component Value Date/Time   WBC 8.4 11/22/2011 0400   RBC 3.51* 11/22/2011 0400   HGB 12.0* 11/22/2011 0400   HCT 35.4* 11/22/2011 0400   PLT 161 11/22/2011 0400   MCV 100.9* 11/22/2011 0400   MCH 34.2* 11/22/2011 0400   MCHC 33.9 11/22/2011 0400   RDW 13.7 11/22/2011 0400   LYMPHSABS 2.8 11/22/2011 0400   MONOABS 0.7 11/22/2011 0400   EOSABS 0.1 11/22/2011 0400   BASOSABS 0.0 11/22/2011 0400   CMP    Component Value Date/Time   NA 137 11/22/2011 0400   K 4.9 11/22/2011 0400   CL 104 11/22/2011 0400   CO2 23 11/22/2011 0400   GLUCOSE 106* 11/22/2011 0400   BUN 34* 11/22/2011 0400   CREATININE 1.26 11/22/2011 0400   CALCIUM 9.6 11/22/2011 0400   PROT 6.2 11/22/2011 0400   ALBUMIN 3.5 11/22/2011 0400   AST 22 11/22/2011 0400   ALT 14 11/22/2011 0400   ALKPHOS 73 11/22/2011 0400   BILITOT 0.5 11/22/2011 0400   GFRNONAA 48* 11/22/2011 0400   GFRAA 56* 11/22/2011 0400   COAGS Lab Results  Component Value Date   INR 1.05 11/22/2011   INR 4.62* 11/22/2011   Lipid Panel No results found for this basename: chol,  trig,  hdl,  cholhdl,  vldl,  ldlcalc   HgbA1C  No results found for this basename: HGBA1C   Urine Drug Screen  No results found for this basename: labopia,  cocainscrnur,  labbenz,  amphetmu,  thcu,  labbarb    Alcohol Level No results found for this basename: eth   CT of the brain   Stable small area of  hemorrhage in the right basal ganglia.   MRI of the brain   9 x 11 x 14 mm right posterior putaminal intracerebral hemorrhage with mild surrounding edema. No significant mass effect or midline shift.  Atrophy with chronic microvascular ischemic change.   MRA of the brain  Not ordered   2D Echocardiogram  estimated ejection fraction was in the range of 50% to 55%. Contraction pattern was dyssynchronous with hypokinesis of the inferoseptal myocardium.  Carotid Doppler  No internal carotid artery stenosis bilaterally. Vertebrals with antegrade flow bilaterally.   Transcranial Doppler Low mean flow velocities throughout likely due to technical difficulties from poor windows throughout. Globally elevated pulsatility indices suggest diffuse intracranial atherosclerosis.  CXR  Not ordered   EKG  normal sinus rhythm.   Physical Exam  awake alert.Afebrile.  Head is nontraumatic. Neck is supple without carotid bruit. Hearing is slightly decreased bilaterally. Cardiac exam ejection murmur no gallop. Lungs clear to auscultation. Abdomen soft nontender.  Neurological exam awake, alert, oriented x3 with normal speech and language function. Mild dysarthria but can be understood easily. Extraocular movements are full range without nystagmus. Visual fields are full to confrontational testing. There is mild left lower facial weakness. Tongue is midline. Motor system exam reveals left upper extremity drift with the hand hitting the bed. There is grade 2-3 strength in the left upper extremity and 4+/5 strength in the left lower extremity.mild weakness of left grip and hand muscles. Sensation appears intact. Left is upgoing right is downgoing. Gait was not tested.   ASSESSMENT Mr. Kamdin Follett is a 78 y.o. male with a right basal ganglia hemorrhage, secondary to hypertension. Not on antiplatelets secondary to hemorrhage. Therapy recommends inpatient rehab. Medically stable for discharge. Await rehab insurance approval and bed. Admissions coordinator sure it will happen today.  Stroke risk factors:  hyperlipidemia, hypertension and CABG  Hospital day # 5  TREATMENT/PLAN Plan discharge to rehab today.  Joaquin Music, ANP-BC, GNP-BC [facility] Pager: [phone] 11/27/2011 10:48 AM  Dr. Delia Heady, Stroke Center Medical Director, has personally reviewed chart, pertinent data, examined the patient and developed the plan of care.

## 2011-11-27 NOTE — Progress Notes (Signed)
Clinical Social Worker received consult for "SNF." PT/OT is recommending CIR. Per Rehab Admission Coordinator, CIR is awaiting for [facility] approval and plans to admit pt. CSW will address consult if needed. Please call if a need arises.  Dede Query, MSW, Theresia Majors [phone]

## 2011-11-27 NOTE — Progress Notes (Signed)
CSW is signing off as pt is being admitted to CIR today.   Dede Query, MSW, Theresia Majors [phone]

## 2011-11-27 NOTE — Progress Notes (Signed)
Rehab admissions - I have faxed updates and called [employer].  I continue to await insurance carrier decision regarding possible acute inpatient rehab admission.  Pager [phone]

## 2011-11-27 NOTE — Plan of Care (Signed)
Problem: Discharge Progression Outcomes Goal: Other Discharge Outcomes/Goals Outcome: Completed/Met Date Met:  11/27/11 Given stroke discharge book.

## 2011-11-27 NOTE — Plan of Care (Addendum)
Overall Plan of Care [facility]) Patient Details Name: Charles Tapia MRN: [id number] DOB: Sep 06, 1918  Diagnosis:  Rehabilitation for intracranial hemorrhage  Primary Diagnosis:    Intracerebral hemorrhage Co-morbidities: advanced age, bowel and bladder issues  Functional Problem List  Patient demonstrates impairments in the following areas: Balance, Bladder, Cognition, Medication Management, Motor, Pain and Perception  Basic ADL's: grooming, bathing, dressing and toileting  Transfers:  bed mobility, bed to chair, car, furniture and floor Locomotion:  ambulation and stairs  Additional Impairments:  Functional use of upper extremity, Communication  comprehension and Social Cognition   memory  Anticipated Outcomes Item Anticipated Outcome  Eating/Swallowing  Independent  Basic self-care  Supervision-Min Assist  Tolieting  Supervision  Bowel/Bladder  Mod I  Transfers  Supervision, Min assist with shower transfer  Locomotion  150' supervision gait  Communication  Mod I  Cognition  Supervision  Pain  NA  Safety/Judgment  Supervision  Other     Therapy Plan: PT Frequency: 2-3 X/day, 60-90 minutesSLP: 1x day for 30-60 minutes x2 weeks OT Frequency: 1-2 X/day, 60-90 minutes, 2 weeks SLP Frequency: 5 out of 7 days   Team Interventions: Item RN PT OT SLP SW TR Other  Self Care/Advanced ADL Retraining   x      Neuromuscular Re-Education  x x      Therapeutic Activities  x x X     UE/LE Strength Training/ROM  x x      UE/LE Coordination Activities  x x      Visual/Perceptual Remediation/Compensation   x      DME/Adaptive Equipment Instruction  x x      Therapeutic Exercise  x x      Balance/Vestibular Training  x x      Patient/Family Education x x x X     Cognitive Remediation/Compensation   x X     Functional Mobility Training  x x      Ambulation/Gait Training  x       Armed forces logistics/support/administrative officer Reintegration  x       Dysphagia/Aspiration Film/video editor    X     Bladder Management x        Bowel Management         Disease Management/Prevention         Pain Management         Medication Management x        Skin Care/Wound Management         Splinting/Orthotics         Discharge Planning     x    Psychosocial Support     x                       Team Discharge Planning: Destination:  Home Projected Follow-up:  SLP and Home Health, HHPT, HHOT Projected Equipment Needs:  Possibly a Tub Bench for OT and None for PT Patient/family involved in discharge planning:  Yes  MD ELOS: 10d Medical Rehab Prognosis:  Good Assessment: 78 year old male with onset of left hemiparesis 2 to right intracranial hemorrhage. Now requires CIR level OT, PT, speech, 24 hour per day 7 day per week rehabilitation nursing and daily M.D. visits

## 2011-11-27 NOTE — Progress Notes (Signed)
Physical Therapy Treatment Patient Details Name: Charles Tapia MRN: [id number] DOB: 11/22/1917 Today's Date: 11/27/2011  PT Assessment/Plan  PT - Assessment/Plan Comments on Treatment Session: Pt with increased alertness today during session.  Very pleasant and motivated to improve mobility. Recommendations for Other Services: Rehab consult Follow Up Recommendations: Inpatient Rehab Equipment Recommended: Defer to next venue PT Goals  Acute Rehab PT Goals PT Goal: Sit at Edge Of Bed - Progress: Progressing toward goal PT Goal: Sit to Supine/Side - Progress: Progressing toward goal PT Goal: Sit to Stand - Progress: Progressing toward goal PT Goal: Stand to Sit - Progress: Progressing toward goal PT Goal: Ambulate - Progress: Progressing toward goal  PT Treatment Precautions/Restrictions  Precautions Precautions: Fall Restrictions Weight Bearing Restrictions: No Mobility (including Balance) Bed Mobility Bed Mobility: Yes Sit to Supine: 3: Mod assist;HOB elevated (comment degrees) (30 degrees) Sit to Supine - Details (indicate cue type and reason): Assist for LE's.  Cues for technique Transfers Transfers: Yes Sit to Stand: 3: Mod assist;From bed;With armrests;With upper extremity assist Sit to Stand Details (indicate cue type and reason): Cues for technique Stand to Sit: 4: Min assist;With upper extremity assist Ambulation/Gait Ambulation/Gait: Yes Ambulation/Gait Assistance: 4: Min assist Ambulation/Gait Assistance Details (indicate cue type and reason): Assist to maintain balance.  Amb 74' with RW then 120' with Bilateral HHA to challenge balance. Ambulation Distance (Feet): 70 Feet (then 120) Assistive device: Rolling walker Gait Pattern: Step-through pattern;Decreased step length - right;Decreased step length - left Gait velocity: decreased Stairs: No  Posture/Postural Control Posture/Postural Control: No significant limitations Static Sitting Balance Static Sitting -  Balance Support: Feet supported Static Sitting - Level of Assistance: 5: Stand by assistance Exercise    End of Session PT - End of Session Equipment Utilized During Treatment: Gait belt Activity Tolerance: Patient limited by fatigue Patient left: in bed;with call bell in reach;with family/visitor present General Behavior During Session: [facility] for tasks performed Cognition: [facility] for tasks performed  Newell Coral 11/27/2011, 2:33 PM  Newell Coral [phone]

## 2011-11-27 NOTE — Discharge Summary (Addendum)
Stroke Discharge Summary  Patient ID: Charles Tapia   MRN: [id number]      DOB: [birth year]  Date of Admission: 11/22/2011 Date of Discharge: 11/27/2011  Attending Physician:  Darcella Cheshire, MD  Consulting Physician(s):    Faith Rogue, MD (PM&R)   Patient's PCP:  Jerl Mina, MD, MD  Discharge Diagnoses:  Principal Problem:  *Intracerebral hemorrhage, right basal ganglia hemorrhage, secondary to hypertensive urgency Active Problems:  Hypertension  Hyperlipidemia  Coronary artery disease  Past Medical History  Diagnosis Date  . Hypertension   . Cardiomyopathy   . CAD (coronary artery disease)   . Gout   . Hyperlipidemia   . Mitral regurgitation    Past Surgical History  Procedure Date  . Coronary bypass graft stenosis   . Coronary artery bypass graft   . Appendectomy   . Back surgery    BMI  Body mass index is 22.05 kg/(m^2).   Medications to be continued on Rehab   . allopurinol  300 mg Oral Daily  . calcium carbonate  1 tablet Oral Daily  . enalapril  20 mg Oral Daily  . metoprolol succinate  50 mg Oral Daily  . omega-3 acid ethyl esters  1 g Oral BID  . pantoprazole  40 mg Oral Q0600  . polyethylene glycol  17 g Oral Daily  . senna-docusate  1 tablet Oral BID  . simvastatin  20 mg Oral Daily  . Tamsulosin HCl  0.4 mg Oral Daily   Laboratory Studies CBC    Component Value Date/Time   WBC 8.4 11/22/2011 0400   RBC 3.51* 11/22/2011 0400   HGB 12.0* 11/22/2011 0400   HCT 35.4* 11/22/2011 0400   PLT 161 11/22/2011 0400   MCV 100.9* 11/22/2011 0400   MCH 34.2* 11/22/2011 0400   MCHC 33.9 11/22/2011 0400   RDW 13.7 11/22/2011 0400   LYMPHSABS 2.8 11/22/2011 0400   MONOABS 0.7 11/22/2011 0400   EOSABS 0.1 11/22/2011 0400   BASOSABS 0.0 11/22/2011 0400   CMP    Component Value Date/Time   NA 137 11/22/2011 0400   K 4.9 11/22/2011 0400   CL 104 11/22/2011 0400   CO2 23 11/22/2011 0400   GLUCOSE 106* 11/22/2011 0400   BUN 34* 11/22/2011 0400   CREATININE  1.26 11/22/2011 0400   CALCIUM 9.6 11/22/2011 0400   PROT 6.2 11/22/2011 0400   ALBUMIN 3.5 11/22/2011 0400   AST 22 11/22/2011 0400   ALT 14 11/22/2011 0400   ALKPHOS 73 11/22/2011 0400   BILITOT 0.5 11/22/2011 0400   GFRNONAA 48* 11/22/2011 0400   GFRAA 56* 11/22/2011 0400   COAGS Lab Results  Component Value Date   INR 1.05 11/22/2011   INR 4.62* 11/22/2011   Lipid Panel No results found for this basename: chol, trig, hdl, cholhdl, vldl, ldlcalc   HgbA1C  No results found for this basename: HGBA1C   Urine Drug Screen  No results found for this basename: labopia, cocainscrnur, labbenz, amphetmu, thcu, labbarb    Alcohol Level No results found for this basename: eth   Significant Diagnostic Studies CT of the brain Stable small area of hemorrhage in the right basal ganglia.  MRI of the brain 9 x 11 x 14 mm right posterior putaminal intracerebral hemorrhage with mild surrounding edema. No significant mass effect or midline shift. Atrophy with chronic microvascular ischemic change.  MRA of the brain Not ordered  2D Echocardiogram estimated ejection fraction was in the range of 50% to 55%.  Contraction pattern was dyssynchronous with hypokinesis of the inferoseptal myocardium.  Carotid Doppler No internal carotid artery stenosis bilaterally. Vertebrals with antegrade flow bilaterally.  Transcranial Doppler Low mean flow velocities throughout likely due to technical difficulties from poor windows throughout. Globally elevated pulsatility indices suggest diffuse intracranial atherosclerosis.  CXR Not ordered  EKG normal sinus rhythm.   History of Present Illness  Charles Tapia is an 78 y.o. male who reports that when he went to bed 11/21/2011 he was feeling at his baseline. He awakened in the middle of the night to go to the bathroom and noted that he was unable to control his left side. He did not report any numbness on the left. Initial NIHSS of 4. Imaging shows a right periventricular  hemorrhage. No midline shift or mass effect noted. Patient to be admitted the the neuro ICU for further evaluation and treatment. BP control. He lived with his wife with dementia prior to admission. He provided care for her.    Hospital Course   Hemorrhage was secondary to hypertensive urgency. Patient was placed on Cardene drip for BP control. Hemorrhage remained during the first 24hours, when patient at highest risk for rebleeding. He was restarted on his home medications, with good results. Cardene was weaned. Once off Cardene, he was transferred to the floor.  Physical therapy, occupational therapy and speech therapy evaluated patient. All agreed continued rehab is needed. [facility] IP rehab was consulted. Family has decided to hire help for their mother with dementia still in the home as well as for Mr. Hayter when he is able to return home. Once insurance approval obtained, patient was transferred to IP rehab.  Patient has Stroke risk factors of hyperlipidemia and hypertension  Discharge Exam  Blood pressure 133/63, pulse 67, temperature 97.3 F (36.3 C), temperature source Oral, resp. rate 16, height 5\' 8"  (1.727 m), weight 65.772 kg (145 lb), SpO2 98.00%. Physical Exam awake alert.Afebrile. Head is nontraumatic. Neck is supple without carotid bruit. Hearing is slightly decreased bilaterally. Cardiac exam ejection murmur no gallop. Lungs clear to auscultation. Abdomen soft nontender.  Neurological exam awake, alert, oriented x3 with normal speech and language function. Mild dysarthria but can be understood easily. Extraocular movements are full range without nystagmus. Visual fields are full to confrontational testing. There is mild left lower facial weakness. Tongue is midline. Motor system exam reveals left upper extremity drift with the hand hitting the bed. There is grade 2-3 strength in the left upper extremity and 4+/5 strength in the left lower extremity.mild weakness of left grip and hand  muscles. Sensation appears intact. Left is upgoing right is downgoing. Gait was not tested.   Discharge Diet  Cardiac thin liquids  Discharge Plan - Disposition:  Transfer to [facility] for ongoing PT, OT and ST - no anticoagulants secondary to hemorrhage  - Ongoing risk factor control by Primary Care Physician. - Risk factor recommendations:  Hypertension target range 130-140/70-80 Lipid range - LDL < 100 and checked every 6 months, fasting  - Follow-up HEDRICK,JAMES, MD, MD in 1 month. - Follow-up with Dr. Delia Heady in 2 months.  Signed Annie Main, AVNP, ANP-BC, [facility] Nurse Practitioner 11/27/2011, 12:41 PM  Dr. Delia Heady, Stroke Center Medical Director, has personally reviewed chart, pertinent data, examined the patient and developed the plan of care.

## 2011-11-27 NOTE — H&P (Signed)
Physical Medicine and Rehabilitation Admission H&P   Chief Complaint  Patient presents with  . Left sided weakness   : HPI: Charles Tapia is an 78 y.o. male with history of HTN, CAD admitted 01/19 with problems controlling left side and slurred speech. CT head with right periventricular hemorrhage. Blood pressure elevated requiring cardene drip. MRI brain with right putaminal ICH with mild surrounding edema. Carotid dopplers without ICA stenosis. 2 D echo EF 50-55%, hypokinesis of inferoseptal myocardium, moderately calcified AV. Patient continues with left sided weakness and labile BP. PT evaluation done and patient continues with balance deficits with posterior lean. Need cues for midline and upright posture.    Review of Systems  HENT: Positive for hearing loss.   Respiratory: Negative for sputum production and shortness of breath.   Cardiovascular: Negative for chest pain.  Gastrointestinal: Positive for constipation. Negative for heartburn and abdominal pain.  Genitourinary: Positive for urgency.  Musculoskeletal: Positive for back pain and joint pain.  Neurological: Positive for focal weakness and weakness.  Psychiatric/Behavioral: Negative for depression. The patient is not nervous/anxious.   L>R hip pain when in bed  Past Medical History  Diagnosis Date  . Hypertension   . Cardiomyopathy   . CAD (coronary artery disease)   . Gout   . Hyperlipidemia    Chronic LBP (ESI- month ago-Dr. Queen [place] radiculopathy RLE   . Mitral regurgitation    Past Surgical History  Procedure Date  . Coronary bypass graft stenosis   . Coronary artery bypass graft   . Appendectomy   . Back surgery    Family History  Problem Relation Age of Onset  . 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 He reports that he has never smoked. He has never used smokeless tobacco. He reports  that he does not drink alcohol or use illicit drugs. Family plans on hiring 24 hours assistance past discharge.   Allergies  Allergen Reactions  . Penicillins Hives and Rash    Hospital Medication:  . allopurinol  300 mg Oral Daily  . calcium carbonate  1 tablet Oral Daily  . enalapril  20 mg Oral Daily  . metoprolol succinate  50 mg Oral Daily  . omega-3 acid ethyl esters  1 g Oral BID  . pantoprazole  40 mg Oral Q0600  . polyethylene glycol  17 g Oral Daily  . senna-docusate  1 tablet Oral BID  . simvastatin  20 mg Oral Daily  . Tamsulosin HCl  0.4 mg Oral Daily     No current outpatient prescriptions on file as of 11/27/2011.    Home:     Functional History:    Functional Status:  Mobility:          ADL:    Cognition: Cognition Orientation Level: Oriented X4 Cognition Orientation Level: Oriented X4   There were no vitals taken for this visit. Physical Exam  Constitutional: He is oriented to person, place, and time and well-developed, well-nourished, and in no distress.  HENT:  Head: Normocephalic and atraumatic.  Mouth/Throat: Oropharynx is clear and moist.  Eyes: Pupils are equal, round, and reactive to light.  Neck: Normal range of motion. Neck supple.  Cardiovascular: Regular rhythm.   Murmur heard.  Systolic murmur is present with a grade of 1/6  Pulmonary/Chest: Effort normal.  Abdominal: Soft. Bowel sounds are normal.  Neurological:  He is alert and oriented to person, place, and time. He displays weakness (LUE > LLE) and facial asymmetry (mild left facial weakness). He shows pronator drift. Coordination (ataxia LUE) and gait abnormal.  Psychiatric: Mood, memory and affect normal.   motor exam: 5/5 strength in the right deltoid, biceps, triceps, and grip 4/5 in the right hip flexors, knee flexors, knee extensors, and ankle dorsiflexors. Left upper extremity strength to/5 in the left deltoid and tricep, 3/5 in the left bicep grip and finger  extensors. 2/5 in the left hip flexors 3/5 in the left knee extensors and ankle dorsiflexors Sensation is intact to light touch  Cranial nerves show no evidence of extraocular weakness. There is no evidence of field cut No results found for this or any previous visit (from the past 48 hour(s)). No results found.  Post Admission Physician Evaluation: 1. Functional deficits secondary  to Right putamen ICH  2. Patient is admitted to receive collaborative, interdisciplinary care between the physiatrist, rehab nursing staff, and therapy team. 3. Patient's level of medical complexity and substantial therapy needs in context of that medical necessity cannot be provided at a lesser intensity of care such as a SNF. 4. Patient has experienced substantial functional loss from his/her baseline which was documented above under the "Functional History" and "Functional Status" headings.  Judging by the patient's diagnosis, physical exam, and functional history, the patient has potential for functional progress which will result in measurable gains while on inpatient rehab.  These gains will be of substantial and practical use upon discharge  in facilitating mobility and self-care at the household level. 5. Physiatrist will provide 24 hour management of medical needs as well as oversight of the therapy plan/treatment and provide guidance as appropriate regarding the interaction of the two. 6. 24 hour rehab nursing will assist with bladder management, bowel management, safety, skin/wound care, disease management, medication administration, pain management and patient education  and help integrate therapy concepts, techniques,education, etc. 7. PT will assess and treat for:  Gait training, pre-gait training, endurance, safety, midline awareness.  Goals are: Supervision to min assist with mobility. 8. OT will assess and treat for: ADLs, cognitive/perceptual skills, endurance, safety, equipment.   Goals are: Minimal  assist with activities of daily living. 9. SLP will assess and treat for: Cognition, communication.  Goals are: Express adequate safety awareness. Assess and treat for memory and concentration. 10. Case Management and Social Worker will assess and treat for psychological issues and discharge planning. 11. Team conference will be held weekly to assess progress toward goals and to determine barriers to discharge. 12.  Patient will receive at least 3 hours of therapy per day at least 5 days per week. 13. ELOS and Prognosis: 2-3 weeks good   Medical Problem List and Plan: 1. DVT Prophylaxis/Anticoagulation: Mechanical: Sequential compression devices, below knee Bilateral lower extremities  2. Pain Management: see below.  3. Mood: pleasant and motivated.  Monitor for now.  4. HTN: monitor with bid checks.  Continue vasotec and metoprolol  5. Hyperlipidemia: On zocor daily for treatment.  6. Lumbago due to spinal stenosis/HNP: monitor for symptoms with increase in activity levels.  Now with complaints of left hip pain-appears to be MS. Will schedule tylenol and use ultram on prn basis.  7. Chronic constipation:  Continue home dose miralax with additional senna for now.  8.  monitor for seizure activity moderate risk given his history of putaminal hemorrhage    Laylanie Kruczek E 11/27/2011, 5:45 PM

## 2011-11-28 DIAGNOSIS — Z5189 Encounter for other specified aftercare: Secondary | ICD-10-CM

## 2011-11-28 DIAGNOSIS — G8110 Spastic hemiplegia affecting unspecified side: Secondary | ICD-10-CM

## 2011-11-28 DIAGNOSIS — I619 Nontraumatic intracerebral hemorrhage, unspecified: Secondary | ICD-10-CM

## 2011-11-28 LAB — DIFFERENTIAL
Basophils Relative: 0 % (ref 0–1)
Monocytes Relative: 9 % (ref 3–12)
Neutro Abs: 4.7 10*3/uL (ref 1.7–7.7)
Neutrophils Relative %: 58 % (ref 43–77)

## 2011-11-28 LAB — BASIC METABOLIC PANEL
GFR calc Af Amer: 42 mL/min — ABNORMAL LOW (ref >90)
GFR calc non Af Amer: 37 mL/min — ABNORMAL LOW (ref >90)
Glucose, Bld: 166 mg/dL — ABNORMAL HIGH (ref 70–99)
Potassium: 5.4 mEq/L — ABNORMAL HIGH (ref 3.5–5.1)
Sodium: 130 mEq/L — ABNORMAL LOW (ref 135–145)

## 2011-11-28 LAB — CBC
HCT: 33.3 % — ABNORMAL LOW (ref 39.0–52.0)
MCHC: 33.9 g/dL (ref 30.0–36.0)
MCV: 100.9 fL — ABNORMAL HIGH (ref 78.0–100.0)
RDW: 13.6 % (ref 11.5–15.5)

## 2011-11-28 LAB — COMPREHENSIVE METABOLIC PANEL
Albumin: 3.2 g/dL — ABNORMAL LOW (ref 3.5–5.2)
BUN: 54 mg/dL — ABNORMAL HIGH (ref 6–23)
Chloride: 94 mEq/L — ABNORMAL LOW (ref 96–112)
Creatinine, Ser: 1.6 mg/dL — ABNORMAL HIGH (ref 0.50–1.35)
Total Bilirubin: 0.4 mg/dL (ref 0.3–1.2)
Total Protein: 6.2 g/dL (ref 6.0–8.3)

## 2011-11-28 MED ORDER — SODIUM CHLORIDE 0.45 % IV SOLN
INTRAVENOUS | Status: DC
  Administered 2011-11-28 – 2011-12-01 (×5): via INTRAVENOUS

## 2011-11-28 MED ORDER — SODIUM POLYSTYRENE SULFONATE 15 GM/60ML PO SUSP
30.0000 g | ORAL | Status: AC
  Administered 2011-11-28: 30 g via ORAL
  Filled 2011-11-28: qty 120

## 2011-11-28 MED ORDER — LIDOCAINE HCL 2 % EX GEL
1.0000 "application " | CUTANEOUS | Status: DC | PRN
  Filled 2011-11-28: qty 5

## 2011-11-28 NOTE — Progress Notes (Signed)
Physical Therapy Session Note  Patient Details  Name: Charles Tapia MRN: [id number] Date of Birth: [birth year]  Today's Date: 11/28/2011 Time: 6962-9528 Time Calculation (min): 41 min  Precautions: Precautions Precautions: Fall Restrictions Weight Bearing Restrictions: No  Pain Pain Assessment Pain Assessment:  (Reports hips are still painful and RN applied cream to them.) Mobility Bed Mobility Supine to Sit: 3: Mod assist Supine to Sit Details: Manual facilitation for weight shifting Transfers Sit to Stand: 4: Min assist Sit to Stand Details (indicate cue type and reason): Pt would use rocking to perform Stand to Sit: 4: Min assist Locomotion  Ambulation Ambulation: Yes Ambulation/Gait Assistance: 4: Min assist Ambulation Distance (Feet): 60 Feet Assistive device: 1 person hand held assist Ambulation/Gait Assistance Details: Manual facilitation for weight shifting Gait Gait Pattern: Decreased step length - right;Decreased step length - left;Decreased stance time - left Wheelchair Mobility Wheelchair Mobility: Yes Wheelchair Assistance: min@ 100'  Exercises Cardiovascular Exercises NuStep: 50 cm2 for 5 minutes   Therapy/Group: Individual Therapy  Charles Tapia 11/28/2011, 3:58 PM

## 2011-11-28 NOTE — Progress Notes (Signed)
Inpatient Rehabilitation Center Individual Statement of Services  Patient Name:  Charles Tapia  Date:  11/28/2011  Welcome to the Inpatient Rehabilitation Center.  Our goal is to provide you with an individualized program based on your diagnosis and situation, designed to meet your specific needs.  With this comprehensive rehabilitation program, you will be expected to participate in at least 3 hours of rehabilitation therapies Monday-Friday, with modified therapy programming on the weekends.  Your rehabilitation program will include the following services:  Physical Therapy (PT), Occupational Therapy (OT), Speech Therapy (ST), 24 hour per day rehabilitation nursing, Therapeutic Recreaction (TR), Case Management (RN and Child psychotherapist), Rehabilitation Medicine, Nutrition Services and Pharmacy Services  Weekly team conferences will be held on  Wednesday  to discuss your progress.  Your RN Case Designer, television/film set will talk with you frequently to get your input and to update you on team discussions.  Team conferences with you and your family in attendance may also be held.  Estimated Length of Stay:  2 weeks                             Goals: Supervision  Depending on your progress and recovery, your program may change.  Your RN Case Estate agent will coordinate services and will keep you informed of any changes.  Your RN Sports coach and SW names and contact numbers are listed  below.  The following services may also be recommended but are not provided by the Inpatient Rehabilitation Center:   Driving Evaluations  Home Health Rehabiltiation Services  Outpatient Rehabilitatation [facility]  Vocational Rehabilitation   Arrangements will be made to provide these services after discharge if needed.  Arrangements include referral to agencies that provide these services.  Your insurance has been verified to be:  [employer] Your primary doctor is:  Dr. Jerl Mina  Pertinent  information will be shared with your doctor and your insurance company.  Case Manager: Lutricia Horsfall, [facility] [phone]  Social Worker:  Dossie Der, [place] [phone]  Information discussed with and copy given to patient by: Brock Ra, 11/28/2011, 12:02 PM

## 2011-11-28 NOTE — Evaluation (Signed)
Physical Therapy Assessment and Plan  Patient Details  Name: Charles Tapia MRN: [id number] Date of Birth: Mar 21, 1918  PT Diagnosis: Abnormality of gait, Hemiparesis non-dominant and Pain in joint Rehab Potential: Good ELOS: 14 days   Today's Date: 11/28/2011 Time: 4098-1191 Time Calculation (min): 60 min  Assessment & Plan Clinical Impression: Patient is a 78 y.o. 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  Patient transferred to CIR on 11/27/2011 .  Patient's past medical history is significant for .   Past Medical History   Diagnosis  Date   .  Hypertension    .  Cardiomyopathy    .  CAD (coronary artery disease)    .  Gout    .  Hyperlipidemia     Chronic LBP (ESI- month ago-Dr. [place] radiculopathy RLE    .  Mitral regurgitation     Past Surgical History   Procedure  Date   .  Coronary bypass graft stenosis    .  Coronary artery bypass graft    .  Appendectomy    .  Back surgery       Patient currently requires min with mobility secondary to muscle weakness and muscle paralysis, impaired timing and sequencing, unbalanced muscle activation and decreased coordination and decreased standing balance and decreased balance strategies.  Pt has an antalgic gait pattern due to pain in bilateral hips from arthritis and low back.  Prior to hospitalization, patient was independent in the community, helping care for his wife.  He lives with Spouse;Other (Comment) (Caregiver and children help with wife's care) in a House.  Home access is  Ramped entrance.  Patient will benefit from skilled PT intervention to maximize safe functional mobility and minimize fall risk for planned discharge home with 24 hour assist.  Anticipate patient will benefit from follow up [facility] at discharge.  PT - End of Session Activity Tolerance: Tolerates 30+ min activity with multiple rests PT Assessment Rehab Potential: Good Barriers to Discharge: None PT  Plan PT Frequency: 2-3 X/day, 60-90 minutes Estimated Length of Stay: 14 days PT Treatment/Interventions: Ambulation/gait training;Balance/vestibular training;Community reintegration;DME/adaptive equipment instruction;Functional mobility training;Neuromuscular re-education;Pain management;Patient/family education;Stair training;Therapeutic Activities;Therapeutic Exercise;UE/LE Strength taining/ROM;UE/LE Coordination activities PT Recommendation Follow Up Recommendations: Home health PT Equipment Recommended: None recommended by PT  Precautions/Restrictions Precautions Precautions: Fall Restrictions Weight Bearing Restrictions: No Pain Pain Assessment Pain Assessment: 0-10 Pain Score:   8 Pain Type: Chronic pain (only when walking) Pain Location: Hip Pain Orientation: Right Pain Intervention(s): Medication (See eMAR) Multiple Pain Sites: Yes 2nd Pain Site Pain Score: 4 Pain Type: Acute pain Pain Location: Hip Pain Orientation: Left Pain Intervention(s): Medication (See eMAR) Home Living/Prior Functioning Home Living Lives With: Spouse;Other (Comment) (Caregiver and children help with wife's care) Receives Help From: Family Type of Home: House Home Layout: One level Home Access: Ramped entrance Home Adaptive Equipment: Walker - rolling;Quad cane;Straight cane;Wheelchair - manual Prior Function Level of Independence: Independent with basic ADLs Able to Take Stairs?: Yes Driving: Yes 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.) Leisure: Hobbies-yes (Comment) (used to play golf) Cognition Overall Cognitive Status: Appears within functional limits for tasks assessed Sensation Sensation Light Touch: Appears Intact Additional Comments: mild left inattention to left extremities Motor  Motor Motor: Hemiplegia  Mobility Bed Mobility Supine to Sit: 4: Min assist Transfers Sit to Stand: 4: Min assist Sit to Stand Details (indicate cue type and reason): Pt would use  rocking to perform Stand to Sit:  4: Min assist Locomotion  Ambulation Ambulation: Yes Ambulation/Gait Assistance: 4: Min assist Ambulation Distance (Feet): 120 Feet Assistive device: 1 person hand held assist Ambulation/Gait Assistance Details: Manual facilitation for weight shifting Gait Gait Pattern: Decreased step length - right;Decreased step length - left;Decreased stance time - left Gait velocity: Decreased speed, when asked to increase step lenght, pt was able to, but seemed to lose some of his control over balance. Stairs / Additional Locomotion Stairs: Yes Stairs Assistance: 3: Mod assist Stairs Assistance Details (indicate cue type and reason): Mild posterior lean.  Increased pain in both hips with either LE being the lead leg. Stair Management Technique: Two rails Number of Stairs: 5  Height of Stairs: 6  Wheelchair Mobility Wheelchair Mobility: Yes Wheelchair Assistance: 1: +1 Total assist  Trunk/Postural Assessment  Thoracic Assessment Thoracic Assessment: Exceptions to [facility] (Mild thoracic kyphoisis)  Balance Standardized Balance Assessment Standardized Balance Assessment: Berg Balance Test Berg Balance Test Sit to Stand: Able to stand using hands after several tries Standing Unsupported: Able to stand 2 minutes with supervision Sitting with Back Unsupported but Feet Supported on Floor or Stool: Able to sit safely and securely 2 minutes Stand to Sit: Controls descent by using hands Transfers: Able to transfer with verbal cueing and /or supervision Standing Unsupported with Eyes Closed: Unable to keep eyes closed 3 seconds but stays steady Standing Ubsupported with Feet Together: Able to place feet together independently but unable to hold for 30 seconds From Standing, Reach Forward with Outstretched Arm: Loses balance while trying/requires external support From Standing Position, Pick up Object from Floor: Able to pick up shoe, needs supervision From Standing  Position, Turn to Look Behind Over each Shoulder: Turn sideways only but maintains balance Turn 360 Degrees: Needs assistance while turning Standing Unsupported, Alternately Place Feet on Step/Stool: Able to complete >2 steps/needs minimal assist Standing Unsupported, One Foot in Front: Needs help to step but can hold 15 seconds Standing on One Leg: Unable to try or needs assist to prevent fall Total Score: 24  Extremity Assessment      RLE Assessment RLE Assessment: Within Functional Limits (Pain in hip is limiting strength) LLE Assessment LLE Assessment: Within Functional Limits (Pain in hip limits strength)  Recommendations for other services: None  Discharge Criteria: Patient will be discharged from PT if patient refuses treatment 3 consecutive times without medical reason, if treatment goals not met, if there is a change in medical status, if patient makes no progress towards goals or if patient is discharged from hospital.  The above assessment, treatment plan, treatment alternatives and goals were discussed and mutually agreed upon: by patient and by family  [name] 11/28/2011, 12:42 PM

## 2011-11-28 NOTE — Progress Notes (Signed)
Social Work Assessment and Plan Assessment and Plan  Patient Name: Charles Tapia  ZOXWR'[id number] Date: 11/28/2011  Problem List:  Patient Active Problem List  Diagnoses  . Intracerebral hemorrhage  . Hypertension  . Hyperlipidemia  . Coronary artery disease  . Gout  . Cardiomyopathy  . Mitral regurgitation    Past Medical History:  Past Medical History  Diagnosis Date  . Hypertension   . Cardiomyopathy   . CAD (coronary artery disease)   . Gout   . Hyperlipidemia   . Mitral regurgitation     Past Surgical History:  Past Surgical History  Procedure Date  . Coronary bypass graft stenosis   . Coronary artery bypass graft   . Appendectomy   . Back surgery     Discharge Planning  Discharge Planning Living Arrangements: Spouse/significant other;Other (Comment) (Private Caregiver 8-3 pm) Support Systems: Spouse/significant other;Children;Church/faith community Assistance Needed: Likely will require assistance at discharge Patient expects to be discharged to:: Home Case Management Consult Needed: Yes (Comment) (RNCM-already involved)  Social/Family/Support Systems    Employment Status Employment Status Employment Status: Retired Fish farm manager Issues: No issues Guardian/Conservator: None  Abuse/Neglect    Emotional Status Emotional Status Pt's affect, behavior adn adjustment status: Pt is not feeling well due to he is dehydrated.  Daughter here observing and reporting he has always been very active and independent.  He wants to do well here. Recent Psychosocial Issues: Other health issues and the nighttime caregiver of wife. Pyschiatric History: No history- Deferred Beck Depression Screen due to lethargy from dehydration and tired. Daughter reports he is doing his best givin his age and deficits.  Patient/Family Perceptions, Expectations & Goals Pt/Family Perceptions, Expectations and Goals Pt/Family understanding of illness & functional limitations: Pt  and Daughter have a good understanding of his stroke and deficits.  Both are hopeful he will do well here.  Daughter is planning for 24 hour care for both parents at discharge. Premorbid pt/family roles/activities: Husband, Father, Grandfather, Retiree, Home owner, Mining engineer, etc Anticipated changes in roles/activities/participation: Plans to resume Pt/family expectations/goals: Pt states: " I want to do well here and get independent."  Daughter: " We are hopeful he will do well and will do what we need to do to help."  Longs Drug Stores: None Premorbid Home Care/DME Agencies: None Transportation available at discharge: Family Resource referrals recommended: Support group (specify) (CVA Support Group)  Discharge Visual merchandiser Resources: Media planner (specify) Theatre manager Medicare) Financial Resources: Restaurant manager, fast food Screen Referred: No Living Expenses: Lives with family Money Management: Patient Home Management:  Caregiver and pt some light meal prep Patient/Family Preliminary Plans: Retrun home with a hired caregiver for both he and wife 24 hour.  Children also involved and aware they will need care at discharge.  Daughter to come back and forth while pt here.  Clinical Impression:  Pleasant male half asleep in the bed does answer when he wants to but lets daughter answer for him. Daughter here and aware he will need 24 hour care and is  Currently working on this.  She is assisting with Mom's care at home currently.      Lucy Chris 11/28/2011

## 2011-11-28 NOTE — Progress Notes (Signed)
Patient information reviewed and entered into [facility] system by Oluwadarasimi Favor, RN, CRRN, PPS Coordinator.  Information including medical coding and functional independence measure will be reviewed and updated through discharge.     Per nursing patient was given "Data Collection Information Summary for Patients in Inpatient Rehabilitation Facilities with attached "Privacy Act Statement-Health Care Records" upon admission.   

## 2011-11-28 NOTE — Evaluation (Signed)
Speech Language Pathology Assessment and Plan  Patient Details  Name: Charles Tapia MRN: [id number] Date of Birth: April 13, 1918  SLP Diagnosis: Mild cognitive impairment, mild dysarthria Rehab Potential: Excellent ELOS: 2 weeks   Time: 0830-0930 Time Calculation (min): 60 min  Assessment & Plan Clinical Impression: Patient is a 78 y.o. year old male with recent admission to the hospital on 11/22/11 with acute left sided weakness.  Patient transferred to CIR on 11/27/2011 .  Patient demonstrates mild processing and working memory deficits which impact his ability to functionally sequence multiple steps and comprehend complex auditory information.  These deficits are further impacted by patient's baseline hearing loss, which may also be exacerbated from this new CVA.  Patient demonstrates awareness of these changes in his processing/memory as well as a mild dysarthria with 75%-90% intelligibility at conversational level. Patient will benefit from skilled SLP services to address new cognitive and speech changes for return to home with home health SLP services.  Patient will likely require 24 hour supervision upon D/C.  SLP - End of Session Patient left: in bed;with call bell in reach Nurse Communication: Cognitive/Linguistic strategies reviewed Assessment Rehab Potential: Excellent Barriers to Discharge: None Therapy Diagnosis: Cognitive Impairments;Dysarthria SLP Plan SLP Frequency: 5 out of 7 days Estimated Length of Stay: 2 weeks SLP Treatment/Interventions: Cognitive remediation/compensation;Therapeutic Activities;Patient/family education;Speech/Language facilitation Recommendation Follow up Recommendations: Home Health SLP Equipment Recommended: None recommended by SLP  Precautions/Restrictions  Restrictions Weight Bearing Restrictions: No General  Chart Reviewed: Yes Additional Pertinent History: 78 y.o. male admitted 11/22/11 after acute onset of left sided weakness. MRI brain showed  right putaminal ICH with mild surrounding edema.  Transferred to CIR on 1/24 for intensive rehab. Treatment Duration (min): 60 Minutes Pain Pain Assessment Pain Assessment: No/denies pain Prior Functioning Cognitive/Linguistic Baseline: Within functional limits Type of Home: House Lives With: Spouse Receives Help From: Friend(s);Personal care attendant Vocation: Retired IT consultant Overall Cognitive Status: Impaired Arousal/Alertness: Awake/alert Orientation Level: Oriented X4 Attention: Sustained Sustained Attention: Appears intact Memory: Impaired Memory Impairment: Retrieval deficit;Decreased recall of new information Awareness: Appears intact Problem Solving: Impaired Problem Solving Impairment: Functional complex Executive Function: Reasoning;Sequencing Reasoning: Appears intact Sequencing: Impaired Sequencing Impairment: Functional complex;Verbal complex Safety/Judgment: Appears intact Comprehension Auditory Comprehension Overall Auditory Comprehension: Impaired Yes/No Questions: Within Functional Limits Commands: Impaired Complex Commands: 50-74% accurate Conversation: Complex Interfering Components: Hearing;Processing speed EffectiveTechniques: Extra processing time;Increased volume;Repetition;Slowed speech;Visual/Gestural cues Visual Recognition/Discrimination Discrimination: Within Function Limits Reading Comprehension Reading Status: Within funtional limits Expression Expression Primary Mode of Expression: Verbal Verbal Expression Overall Verbal Expression: Appears within functional limits for tasks assessed Written Expression Dominant Hand: Right Written Expression: Within Functional Limits Oral/Motor Oral Motor/Sensory Function Overall Oral Motor/Sensory Function: Impaired Labial ROM: Reduced left Labial Symmetry: Abnormal symmetry left Labial Strength: Reduced Labial Sensation: Within Functional Limits Lingual ROM: Within Functional Limits Lingual  Symmetry: Within Functional Limits Lingual Strength: Within Functional Limits Lingual Sensation: Within Functional Limits Facial ROM: Reduced left Facial Symmetry: Left droop Facial Strength: Reduced Facial Sensation: Reduced Velum: Within Functional Limits Mandible: Within Functional Limits Motor Speech Overall Motor Speech: Impaired Respiration: Within functional limits Phonation: Hoarse Resonance: Within functional limits Articulation: Impaired Level of Impairment: Conversation Intelligibility: Intelligibility reduced Word: 75-100% accurate Phrase: 75-100% accurate Sentence: 75-100% accurate Conversation: 50-74% accurate Motor Planning: Witnin functional limits Motor Speech Errors: Not applicable  Recommendations for other services: None  Discharge Criteria: Patient will be discharged from SLP if patient refuses treatment 3 consecutive times without medical reason, if treatment goals not met, if there is a  change in medical status, if patient makes no progress towards goals or if patient is discharged from hospital.  The above assessment, treatment plan, treatment alternatives and goals were discussed and mutually agreed upon: by patient and by family  Myra Rude, M.S.,CCC-SLP Pager [phone] 11/28/2011 10:06 AM

## 2011-11-28 NOTE — Evaluation (Signed)
Occupational Therapy Assessment and Plan  Patient Details  Name: Charles Tapia MRN: [id number] Date of Birth: [birth year]  OT Diagnosis: abnormal posture, cognitive deficits and hemiplegia affecting non-dominant side Rehab Potential: Rehab Potential: Good ELOS: 2 weeks   Today's Date: 11/28/2011 Time: 1000-1100 Time Calculation (min): 60 min  Assessment & Plan Clinical Impression: Patient is a 78 y.o. year old male with recent admission to the hospital on 01/19 with problems controlling left side and slurred speech. CT head with right periventricular hemorrhage. Blood pressure elevated requiring cardene drip. MRI brain with right putaminal ICH with mild surrounding edema. Carotid dopplers without ICA stenosis. 2 D echo EF 50-55%, hypokinesis of inferoseptal myocardium, moderately calcified AV. Patient transferred to CIR on 11/27/2011 .   Patient with history of HTN, CAD admitted Patient continues with left sided weakness and labile BP.  Patient currently requires max-mod assist with basic self-care skills secondary to muscle weakness, abnormal tone, decreased attention to left, decreased memory and decreased sitting balance, decreased standing balance, decreased postural control, hemiplegia and decreased balance strategies.  Prior to hospitalization, patient could complete BADL with Modified independence.  Patient will benefit from skilled intervention to increase independence with basic self-care skills prior to discharge with assistance from paid caregiver and family.  Anticipate patient will require minimal physical assistance and follow up home health.  OT - End of Session Activity Tolerance: Tolerates 30+ min activity without fatigue OT Assessment Rehab Potential: Good Barriers to Discharge: None OT Plan OT Frequency: 1-2 X/day, 60-90 minutes Estimated Length of Stay: 2 weeks OT Treatment/Interventions: Balance/vestibular training;DME/adaptive equipment instruction;Functional mobility  training;Neuromuscular re-education;Patient/family education;Self Care/advanced ADL retraining;Therapeutic Activities;UE/LE Strength taining/ROM;UE/LE Coordination activities;Therapeutic Exercise OT Recommendation Follow Up Recommendations: Home health OT Equipment Details: Will explore possibility of tub bench  Precautions/Restrictions  Precautions Precautions: Fall  Pain Pain Assessment: Patient reports right hip and knee pain that he also had PTA.  Home Living/Prior Functioning Home Living Lives With: Spouse;Other (Comment) (Caregiver and children help with wife's care) Receives Help From: Family Type of Home: House Home Layout: One level Home Access: Ramped entrance Bathroom Shower/Tub: Walk-in shower;Door (pt uses the bathroom with the walk-in shower) Bathroom Toilet: Standard Bathroom Accessibility: Yes How Accessible: Accessible via walker Home Adaptive Equipment: Walker - rolling;Quad cane;Straight cane;Wheelchair - manual Additional Comments: PTA patient would assist wife at night with bed pan.  Upon discharge, family report they will arrange for 24/7 attendant. 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 Leisure: Hobbies-yes (Comment) (used to play golf) ADL ADL Eating: Minimal assistance Grooming: Minimal assistance Upper Body Bathing: Minimal assistance Where Assessed-Upper Body Bathing: Edge of bed Lower Body Bathing: Moderate assistance Upper Body Dressing: Maximal assistance Lower Body Dressing: Maximal assistance Vision/Perception  Vision - History Baseline Vision: Wears glasses all the time Patient Visual Report: No change from baseline Perception Perception: Impaired Inattention/Neglect: Does not attend to left side of body  Cognition Overall  Cognitive Status: Appears within functional limits for tasks assessed (not formally assessed) Sensation Sensation Light Touch: Appears Intact Stereognosis: Impaired by gross assessment Proprioception Impaired Details: Impaired LLE Motor  Motor Motor: Hemiplegia Mobility  Bed Mobility Bed Mobility: Yes Supine to Sit: 3: Mod assist Supine to Sit Details: Manual facilitation for weight shifting Sitting - Scoot to Edge of Bed: 3: Mod assist Sitting - Scoot to Edge of Bed Details:  Tactile cues for weight shifting;Verbal cues for sequencing;Verbal cues for technique Transfers Sit to Stand: 4: Min assist Stand to Sit: 4: Min assist  Trunk/Postural Assessment  Thoracic Assessment Thoracic Assessment: Exceptions to [facility] (Mild thoracic kyphoisis)  Extremity/Trunk Assessment RUE Assessment RUE Assessment: Exceptions to [facility] (shoulder flexion 40 degrees PTA shoulder injury) Right Shoulder Flexion  0-170: 40 Degrees LUE Overall AROM Comments: ~60 degrees shoulder flexion, full elbow flex & extend as well as supination/pronation with elbow at 90 degrees.  UE Brunnstrom III-IV, Hand Brunnstrom IV-V  Recommendations for other services: None  Discharge Criteria: Patient will be discharged from OT if patient refuses treatment 3 consecutive times without medical reason, if treatment goals not met, if there is a change in medical status, if patient makes no progress towards goals or if patient is discharged from hospital.  The above assessment, treatment plan, treatment alternatives and goals were discussed and mutually agreed upon: by patient and by family (daughter, Dewayne Hatch)  [name] 11/28/2011, 4:44 PM

## 2011-11-29 DIAGNOSIS — I619 Nontraumatic intracerebral hemorrhage, unspecified: Secondary | ICD-10-CM

## 2011-11-29 DIAGNOSIS — G8110 Spastic hemiplegia affecting unspecified side: Secondary | ICD-10-CM

## 2011-11-29 DIAGNOSIS — Z5189 Encounter for other specified aftercare: Secondary | ICD-10-CM

## 2011-11-29 LAB — BASIC METABOLIC PANEL
BUN: 42 mg/dL — ABNORMAL HIGH (ref 6–23)
Chloride: 96 mEq/L (ref 96–112)
GFR calc Af Amer: 57 mL/min — ABNORMAL LOW (ref >90)
Potassium: 4.7 mEq/L (ref 3.5–5.1)
Sodium: 132 mEq/L — ABNORMAL LOW (ref 135–145)

## 2011-11-29 LAB — URINE CULTURE: Culture  Setup Time: 201301250149

## 2011-11-29 NOTE — Progress Notes (Signed)
Physical Therapy Note  Patient Details  Name: Charles Tapia MRN: [id number] Date of Birth: 1918/04/04 Today's Date: 11/29/2011 Time; 7829-5621 (45')  Precautions: Precautions  Precautions: Fall  Restrictions  Weight Bearing Restrictions: No  Pain  Pain Assessment  Pain Assessment: (Reports hips are still painful with side lying at night and with ambulation)   Therapeutic Exercise: (15') B LE's in sitting and in standing with minisquats  Gait Training: (30') gait x 100' using handheld assist, also using single point cane with close supervision and LOB 2x during turning activity requiring min-A to regain balance.                                   Patient ambulated multiple times over above distance with turning (sitting for multiple rests in between sessions).  Patient is unsteady especially when turning around to sit into w/c and having difficulty with reaching L hand onto armrest of w/c.   Individual Therapy Session   Jodelle Gross 11/29/2011, 3:08 PM

## 2011-11-29 NOTE — Progress Notes (Signed)
Patient ID: Charles Tapia, male   DOB: 10-06-1918, 78 y.o.   MRN: [id number] Subjective/Complaints:  No complaints.  Slept fairly well.  Good appetite Review of Systems  Respiratory: Negative for cough and wheezing.   Cardiovascular: Negative for chest pain.  All other systems reviewed and are negative.     Objective:  Vital Signs:  Blood pressure 151/68, pulse 62, temperature 97.5 F (36.4 C), temperature source Oral, resp. rate 18, SpO2 96.00%.  Last 5 Results:   Lab  11/28/11 0620  11/22/11 0400   WBC  8.1  8.4   HGB  11.3*  12.0*   HCT  33.3*  35.4*   PLT  194  161   NA  130*  137   K  5.3*  4.9   CL  94*  104   CO2  29  23   GLUCOSE  116*  106*   BUN  54*  34*   CREATININE  1.60*  1.26   CALCIUM  9.3  9.6    Physical Exam: General appearance: alert and appears stated age  Resp: clear to auscultation bilaterally  Cardio: regular rate and rhythm  GI: soft, non-tender; bowel sounds normal; no masses, no organomegaly  Extremities: extremities normal, atraumatic, no cyanosis or edema  Pulses: 2+ and symmetric  Skin: Skin color, texture, turgor normal. No rashes or lesions  Neurologic: Alert. Speech dysarthric.  Left HP.  DTR's 1+  Assessment/Plan:  1. 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  Physiatrist is providing close team supervision and 24 hour management of active medical problems listed below.  Physiatrist and rehab team continue to assess barriers to discharge/monitor patient progress toward functional and medical goals.  FIM:    FIM - Toileting  Toileting: 1: Total-Patient completed zero steps, helper did all 3  FIM - Therapist, nutritional Devices: Systems developer Transfers: 3-To toilet/BSC: Mod A (lift or lower assist)    Comprehension  Comprehension Mode: Auditory  Comprehension: 5-Set-up assist with hearing  device  Expression  Expression Mode: Verbal  Expression: 5-Expresses complex 90% of the time/cues < 10% of the time  Social Interaction  Social Interaction: 6-Interacts appropriately with others with medication or extra time (anti-anxiety, antidepressant).  Problem Solving  Problem Solving: 5-Solves basic problems: With no assist  Memory  Memory: 5-Recognizes or recalls 90% of the time/requires cueing < 10% of the time    2. Anticoagulation/DVT prophylaxis with Pharmaceutical: PAS hose, mobility.  3. Pain Management: monitor on scheduled tylenol. Use biofreeze to back and hips as at home. Ultram prn.  4. Mood: pleasant and motivated. Monitor for now.  5. HTN: monitor with bid checks. Continue metoprolol. Avoid ace for now.  6. Hyperlipidemia: On zocor daily for treatment.  7. Lumbago due to spinal stenosis/HNP: monitor for symptoms with increase in activity levels. 8. Chronic constipation: Continue home dose miralax with additional senna for now.  9. Monitor for seizure activity moderate risk given his history of putaminal hemorrhage   10. Acute renal failure with hyperkalemia: Recheck labs without change. Currently on ace which can affect renal function-will discontinue. ? Voiding function. Will check PVRs to r/o retention as cause of renal failure.   BUN/CR 42/1.4 and K+ down to 4.7  -continue present plan and recheck bmet on monday

## 2011-11-30 NOTE — Progress Notes (Signed)
Patient ID: Charles Tapia, male   DOB: 1918/08/20, 78 y.o.   MRN: [id number] Patient ID: Charles Tapia, male   DOB: 1918/10/14, 78 y.o.   MRN: [id number] Subjective/Complaints:  1/27 --No complaints.  Slept fairly well.  Good appetite.  Review of Systems  Respiratory: Negative for cough and wheezing.   Cardiovascular: Negative for chest pain.  All other systems reviewed and are negative.     Objective:  Vital Signs:  Blood pressure 151/68, pulse 62, temperature 97.5 F (36.4 C), temperature source Oral, resp. rate 18, SpO2 96.00%.  Last 5 Results:   Lab  11/28/11 0620  11/22/11 0400   WBC  8.1  8.4   HGB  11.3*  12.0*   HCT  33.3*  35.4*   PLT  194  161   NA  130*  137   K  5.3*  4.9   CL  94*  104   CO2  29  23   GLUCOSE  116*  106*   BUN  54*  34*   CREATININE  1.60*  1.26   CALCIUM  9.3  9.6    Physical Exam: General appearance: alert and appears stated age  Resp: clear to auscultation bilaterally  Cardio: regular rate and rhythm  GI: soft, non-tender; bowel sounds normal; no masses, no organomegaly  Extremities: extremities normal, atraumatic, no cyanosis or edema  Pulses: 2+ and symmetric  Skin: Skin color, texture, turgor normal. No rashes or lesions  Neurologic: Alert. Speech dysarthric but quite intelligible.  Left HP.  DTR's 1+ . LUE 0 to 1/5.  Has limitations in R shoulder movement as well due to chronic arthritis. Exam 1/27 Assessment/Plan:  1. 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  Physiatrist is providing close team supervision and 24 hour management of active medical problems listed below.  Physiatrist and rehab team continue to assess barriers to discharge/monitor patient progress toward functional and medical goals.  FIM:    FIM - Toileting  Toileting: 1: Total-Patient completed zero steps, helper did all 3  FIM - Investment banker, operational Devices: Systems developer Transfers: 3-To toilet/BSC: Mod A (lift or lower assist)    Comprehension  Comprehension Mode: Auditory  Comprehension: 5-Set-up assist with hearing device  Expression  Expression Mode: Verbal  Expression: 5-Expresses complex 90% of the time/cues < 10% of the time  Social Interaction  Social Interaction: 6-Interacts appropriately with others with medication or extra time (anti-anxiety, antidepressant).  Problem Solving  Problem Solving: 5-Solves basic problems: With no assist  Memory  Memory: 5-Recognizes or recalls 90% of the time/requires cueing < 10% of the time    2. Anticoagulation/DVT prophylaxis with Pharmaceutical: PAS hose, mobility.  3. Pain Management: monitor on scheduled tylenol. Use biofreeze to back and hips as at home. Ultram prn.   4. Mood: pleasant and motivated. Monitor for now.  5. HTN: monitor with bid checks. Continue metoprolol. Avoid ace for now.  6. Hyperlipidemia: On zocor daily for treatment.  7. Lumbago due to spinal stenosis/HNP: monitor for symptoms with increase in activity levels. Denies significant pain at present. 8. Chronic constipation: Continue home dose miralax with additional senna for now.  9. Monitor for seizure activity moderate risk given his history of putaminal hemorrhage   10. Acute renal failure with hyperkalemia: Recheck labs without change. Currently on ace which can affect renal function-will discontinue. ? Voiding function.  Checking pvr's.  Wearing a condom cath at night..    -BUN/CR 42/1.4 and K+ down to 4.7  -continue present plan and recheck bmet on monday

## 2011-11-30 NOTE — Progress Notes (Signed)
Physical Therapy Note  Patient Details  Name: Dmani Mizer MRN: [id number] Date of Birth: Feb 12, 1918 Today's Date: 11/30/2011  1300-1340 (40 minutes) Pain- no complaint of pain Focus of treatment : Therapeutic activities to improve tolerance to activity/protective balance reactions Treatment: Nustep (crosstrainer) level 3 X 5 minutes ; Perceived exertion 13 (somewhat hard) ; Biodex for limits of stability/ weight shift with difficulty with weight shift to right (pt reports occasional pain RT hip; Gait- sit to stand min assist to close supervision; 80 feet min assist SPC with decreased step length bilaterally   Martisha Toulouse,JIM 11/30/2011, 1:29 PM

## 2011-11-30 NOTE — Progress Notes (Signed)
Physical Therapy Note  Patient Details  Name: Charles Tapia MRN: [id number] Date of Birth: Sep 30, 1918 Today's Date: 11/30/2011  4098-1191 (45 minutes) individual treatment session Pain- no reported pain  Focus of treatment: Gait training with/without appropriate AD Treatment: Supine to sit SBA with increased time; Transfer SPT min assist for safety; Gait - 100 feet X 1 without AD min assist with decreased step length bilaterally; Gait with SPC 100 feet min assist for safety with improved step length bilaterally; Gait over uneven surfaces, up/down 12 steps , up/down curb, up/down incline min assist with difficulty maintaining balance on soft surface and steps, vcs for use of cane and railing on steps, some difficulty judging step height. Sit to stand X 5 from wc SBA with RT UE assist; without UE assist min assist with multiple tries; Sit to stand with bedside table to improve forward lean and no UE assist 2 X 5 with partial squats min assist.   Kindell Strada,JIM 11/30/2011, 9:08 AM

## 2011-11-30 NOTE — Progress Notes (Signed)
Occupational Therapy Session Note  Patient Details  Name: Charles Tapia MRN: [id number] Date of Birth: [birth year]  Today's Date: 11/30/2011 Time: [id number] Time Calculation (min): 100 min  Precautions: Precautions Precautions: Fall Restrictions Weight Bearing Restrictions: No  Short Term Goals: OT Short Term Goal 1: Will perform bath with min assist in sit and stand OT Short Term Goal 2: Will perform UB dressing with button down shirt with mod assist OT Short Term Goal 3: Will perform LB dressing with mod assist in sit and stand OT Short Term Goal 4: Will perform shower transfer with min assist using DME. OT Short Term Goal 5: Will visually attend to LUE during BADL tasks with mod cues  Skilled Therapeutic Interventions/Progress Updates:    OT addressed bathing and dressing at sink level.  Focused on using LUE during bath and having pt "look at his arm" as he is moving it.  Pt. Ambulated to bathroom with  [place] and minimal assist. Pt. Needed manual assist for upright posture when walking.  Went to ADL Apt and engaged in kitchen activities to force use LUE.  Performed lots of holding and weight bearing to wash dishes.  Pt has decreased thumb to finger opposition and needed assist with grabing.  Pt could pick up objects if placed between fingers. Pt, removed paper towel from dispenser 2 out of 5 times.    Pain Pain Assessment Pain Score:3-Back pain ADLSee Fim  Therapy/Group: Individual Therapy  Humberto Seals 11/30/2011,1100AM

## 2011-12-01 LAB — BASIC METABOLIC PANEL
CO2: 27 mEq/L (ref 19–32)
Chloride: 95 mEq/L — ABNORMAL LOW (ref 96–112)
GFR calc Af Amer: 57 mL/min — ABNORMAL LOW (ref >90)
Potassium: 4.2 mEq/L (ref 3.5–5.1)
Sodium: 131 mEq/L — ABNORMAL LOW (ref 135–145)

## 2011-12-01 NOTE — Progress Notes (Signed)
Progress Notes  Speech Language Pathology Therapy Note  Patient Details  Name: Barre Aydelott MRN: [id number] Date of Birth: [birth year]  Today's Date: 12/01/2011 Time: 0900-0930 Time Calculation (min): 30 min  Precautions: Precautions Precautions: Fall Restrictions Weight Bearing Restrictions: No  Short Term Goals: 11/28/11 1. Patient will utilize speech intelligibility strategies at the conversational speech level with minimal verbal/visual cues 2. Patient will use external memory aids to recall daily information with minimal prompts 3. Patient will utilize hearing/processing strategies by requesting repetition or simplification of information with minimal verbal/visual cues 4. Patient will complete familiar, complex time/medication/money management tasks with supervision verbal cues  Skilled Therapeutic Interventions/Progress Updates: Session focused on recall of speech goals; patient unable to recall any.  Upon discussion of deficits since CVA patient able to verbalize physical deficits but no speech or cognitive changes; following a familiar, financial problem solving task SLP facilitated session with minimal assist semantic cues; following activity patient able to verbalize that was different since his CVA.      Pain Pain Assessment Pain Assessment: No/denies pain Pain Score: 0-No pain  Therapy/Group: Individual Therapy  Charlane Ferretti., CCC-SLP [phone]  Charlye Spare 12/01/2011 11:26 AM

## 2011-12-01 NOTE — Progress Notes (Signed)
Per State Regulation 482.30 This chart was reviewed for medical necessity with respect to the patient's Admission/Duration of stay. Pt participating in therapies with slow, steady progress. Min-max A. Supervision goals. Continent of B&B.  IV d/c'd today.  Meryl Dare                 Nurse Care Manager            Next Review Date: 12/05/11

## 2011-12-01 NOTE — Progress Notes (Signed)
Physical Therapy Session Note  Patient Details  Name: Charles Tapia MRN: [id number] Date of Birth: 14-Apr-1918  Today's Date: 12/01/2011 Time: 4098-1191 Time Calculation (min): 50 min  Precautions: Precautions Precautions: Fall Restrictions Weight Bearing Restrictions: No  S;  Pt fatigued this afternoon, unable to perform at prior level of function.  Reported multiple times how fatigued he felt.  Returned to bed at end of session to rest secondary to pt had been up all day. Skilled Therapeutic Interventions/Progress Updates:      Pain Pain Assessment Pain Assessment: No/denies pain Mobility Bed Mobility Bed Mobility:  (Sit to supine with mod@) Locomotion  Ambulation Ambulation/Gait Assistance: 3: Mod assist Assistive device: Straight cane;1 person hand held assist Ambulation/Gait Assistance Details (indicate cue type and reason): Pt leaning posteriorly on therapist during gait.  Taking short steps with knees flexed.  Different from 1/25.  Balance Dynamic Standing Balance Dynamic Standing - Balance Support: No upper extremity supported Dynamic Standing - Level of Assistance: 2: Max assist Dynamic Standing - Balance Activities: Ball toss Dynamic Standing - Comments: Pt with severe posterior lean during ball toss, placed wedge under toes to force pt to shift weight forward to stop fall posteriorly.  Still difficult, changed to less dyanamic activity having patient sort tools, was then able to maintain intermittently with close supervision but overall min @   Exercises  NuStep 5 min for coordination training on workload =3   Therapy/Group: Individual Therapy  [name] 12/01/2011, 4:37 PM

## 2011-12-01 NOTE — Progress Notes (Signed)
Remains continent of bladder during the day. Requires staff to assist with positioning and removal of urinal. Wears condom cath at noc. Last bowel movement 11/29/11. Offering fluids. Pt complainant, prefers strawberry ensure. Calls appropriately for assistance. Skin, CDI. Hard of hearing, bilateral heaing aids in use. Receiving muscle rub and scheduled Tylenol 500mg  for lower back discomfort. Pt reports effectiveness.

## 2011-12-01 NOTE — Progress Notes (Signed)
Progress Notes  Speech Language Pathology Therapy Note  Patient Details  Name: Charles Tapia MRN: [id number] Date of Birth: 1918/08/20  Today's Date: 12/01/2011 Time: 1015-1100 Time Calculation (min): 45 min  Precautions: Precautions Precautions: Fall Restrictions Weight Bearing Restrictions: No  Skilled Therapeutic Interventions/Progress Updates: Session (double) focused on get to know you with fellow rehab patient.  SLP and peer facilitated session with minimal assist semantic/visual cues to increase vocal intensity and speech over articulation so that listeners across the table could hear with minimal environmental distractions.  Following earlier session with demonstrative cues used to facilitate intellectual awareness of cognitive deficits; patient able to only verbalize speech and physical changes since CVA.      Precautions/Restrictions  Precautions Precautions: Fall  Pain Pain Assessment Pain Assessment: No/denies pain  Therapy/Group: Individual Therapy  Charlane Ferretti., CCC-SLP [phone]  Charles Tapia 12/01/2011 5:01 PM

## 2011-12-01 NOTE — Progress Notes (Signed)
Patient ID: Charles Tapia, male   DOB: January 13, 1918, 78 y.o.   MRN: [id number] Subjective/Complaints:  No issues reported  Review of Systems  Respiratory: Negative for cough and wheezing.   Cardiovascular: Negative for chest pain.  All other systems reviewed and are negative.     Objective:  Vital Signs:  Blood pressure 151/68, pulse 62, temperature 97.5 F (36.4 C), temperature source Oral, resp. rate 18, SpO2 96.00%.  Last 5 Results:   Lab  11/28/11 0620  11/22/11 0400   WBC  8.1  8.4   HGB  11.3*  12.0*   HCT  33.3*  35.4*   PLT  194  161   NA  130*  137   K  5.3*  4.9   CL  94*  104   CO2  29  23   GLUCOSE  116*  106*   BUN  54*  34*   CREATININE  1.60*  1.26   CALCIUM  9.3  9.6    Physical Exam: General appearance: alert and appears stated age  Resp: clear to auscultation bilaterally  Cardio: regular rate and rhythm  GI: soft, non-tender; bowel sounds normal; no masses, no organomegaly  Extremities: extremities normal, atraumatic, no cyanosis or edema  Pulses: 2+ and symmetric  Skin: Skin color, texture, turgor normal. No rashes or lesions  Neurologic: Alert. Speech dysarthric but quite intelligible.  Left HP 2/5 in LUE and 3/5 in LLE.  DTR's 1+ . LUE 0 to 1/5.  Has limitations in R shoulder movement as well due to chronic arthritis.  Assessment/Plan:  1. 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  Physiatrist is providing close team supervision and 24 hour management of active medical problems listed below.  Physiatrist and rehab team continue to assess barriers to discharge/monitor patient progress toward functional and medical goals.  FIM:    FIM - Toileting  Toileting: 1: Total-Patient completed zero steps, helper did all 3  FIM - Therapist, nutritional Devices: Systems developer Transfers: 3-To toilet/BSC: Mod A (lift or  lower assist)    Comprehension  Comprehension Mode: Auditory  Comprehension: 5-Set-up assist with hearing device  Expression  Expression Mode: Verbal  Expression: 5-Expresses complex 90% of the time/cues < 10% of the time  Social Interaction  Social Interaction: 6-Interacts appropriately with others with medication or extra time (anti-anxiety, antidepressant).  Problem Solving  Problem Solving: 5-Solves basic problems: With no assist  Memory  Memory: 5-Recognizes or recalls 90% of the time/requires cueing < 10% of the time    2. Anticoagulation/DVT prophylaxis with Pharmaceutical: PAS hose, mobility.  3. Pain Management: monitor on scheduled tylenol. Use biofreeze to back and hips as at home. Ultram prn.   4. Mood: pleasant and motivated. Monitor for now.  5. HTN: monitor with bid checks. Continue metoprolol. Avoid ace for now.  6. Hyperlipidemia: On zocor daily for treatment.  7. Lumbago due to spinal stenosis/HNP: monitor for symptoms with increase in activity levels. Denies significant pain at present. 8. Chronic constipation: Continue home dose miralax with additional senna for now.  9. Monitor for seizure activity moderate risk given his history of putaminal hemorrhage   10. Acute renal failure with hyperkalemia: Improving, am BUN 36

## 2011-12-01 NOTE — Progress Notes (Signed)
Occupational Therapy Session Note  Patient Details  Name: Smitty Ackerley MRN: [id number] Date of Birth: 07/31/1918  Today's Date: 12/01/2011 Time: 0730-0830 Time Calculation (min): 60 min  Precautions: Precautions Precautions: Fall Restrictions Weight Bearing Restrictions: No  Short Term Goals: OT Short Term Goal 1: Will perform bath with min assist in sit and stand OT Short Term Goal 2: Will perform UB dressing with button down shirt with mod assist OT Short Term Goal 3: Will perform LB dressing with mod assist in sit and stand OT Short Term Goal 4: Will perform shower transfer with min assist using DME. OT Short Term Goal 5: Will visually attend to LUE during BADL tasks with mod cues  Skilled Therapeutic Interventions/Progress Updates:  Self care retraining to include sponge bath at sink, dressing, grooming, use of urinal, and self feeding.  Focus session on bed mobility, bed<>W/C squat pivot transfer, increased use of LUE during all BADL tasks as stabilizer and gross assist.  Continent of urine while stand and hold the urinal with non affected RUE and steady assist.  Patient requires verbal and tactile cues to weight shift onto LLE during BADL tasks in standing.  Patient required set up for breakfast.  Pain Pain Assessment Pain Assessment: No/denies pain  Therapy/Group: Individual Therapy  Luzmaria Devaux 12/01/2011, 4:53 PM

## 2011-12-02 DIAGNOSIS — G8110 Spastic hemiplegia affecting unspecified side: Secondary | ICD-10-CM

## 2011-12-02 DIAGNOSIS — I619 Nontraumatic intracerebral hemorrhage, unspecified: Secondary | ICD-10-CM

## 2011-12-02 DIAGNOSIS — Z5189 Encounter for other specified aftercare: Secondary | ICD-10-CM

## 2011-12-02 MED ORDER — BACITRACIN-NEOMYCIN-POLYMYXIN OINTMENT TUBE
TOPICAL_OINTMENT | Freq: Two times a day (BID) | CUTANEOUS | Status: DC
  Administered 2011-12-02 – 2011-12-09 (×13): via TOPICAL
  Filled 2011-12-02 (×2): qty 15

## 2011-12-02 MED ORDER — BACITRACIN-NEOMYCIN-POLYMYXIN 400-5-5000 EX OINT
TOPICAL_OINTMENT | Freq: Two times a day (BID) | CUTANEOUS | Status: DC
Start: 2011-12-02 — End: 2011-12-02
  Filled 2011-12-02 (×3): qty 1

## 2011-12-02 NOTE — Plan of Care (Signed)
Problem: RH Tub/Shower Transfers Goal: LTG Patient will perform tub/shower transfers w/assist (OT) LTG: Patient will perform tub/shower transfers with assist, with/without cues using equipment (OT)  Outcome: Not Progressing Not yet attempted

## 2011-12-02 NOTE — Progress Notes (Signed)
Physical Therapy Session Note  Patient Details  Name: Parke Jandreau MRN: [id number] Date of Birth: [birth year]  Today's Date: 12/02/2011 Time: session 1 10:03-11:00, 57 minutes Pain- none  Session 2: Time: 5621-3086 Time Calculation (min): 45 min  Precautions: Precautions Precautions: Fall Restrictions Weight Bearing Restrictions: No     Skilled Therapeutic Interventions/Progress Updates:    1st session NMR to promote postural control and decrease posterior lean after pt stretched and trunk activated with good carryover to functional mobility. Gait with RW but pt with poor grip on L, gait without device with forced use LUE to promote trunk activity and decrease L collapse with mod cues to sustain activation.  General Chart Reviewed: Yes Family/Caregiver Present: No Vital Signs Therapy Vitals Pulse Rate: 74  BP: 152/74 mmHg (with mobility) Oxygen Therapy SpO2: 97 % O2 Device: None (Room air) Pain Pain Assessment Pain Assessment: No/denies pain Mobility Transfers Stand Pivot Transfers: 3: Mod assist;With armrests (progressed to close S after NMR to decrease posterior lean) Stand Pivot Transfer Details: Verbal cues for technique;Tactile cues for posture Locomotion  Ambulation Ambulation/Gait Assistance: 4: Min assist Assistive device: 1 person hand held assist (pushing thru LUE) Ambulation/Gait Assistance Details (indicate cue type and reason): 200  3x 200, 150 with LUE support, 200 with RW controlled environment except for turns Mod a x 1 to correct lean to R as pt distracted by pants slipping down during gait     Sitting balance and Exercises- passive stretch to heel cords, hamstrings and hips to aid mobility, pt removed shoes with min A using BUE after but pt with mod A for balance seated d/t falling to L and not attending to L UE to support him during this functional task  Other Treatments Treatments Therapeutic Activity: passive and active assist to active stretch  and ROM to pelvis and upper trunk to promote extension , also to decrease collapse L trunk seated Neuromuscular Facilitation: Left;Upper Extremity;Forced use;Activity to increase anterior-posterior weight shifting (BLE, standing with bedside table)  Session 2- NMR standing weight shifting and performing functional tasks with LUE (picking up rings and putting them over cone), repeat sit to stands min A pushing up with L hand only for forced use L side and to increase L attention to UE, gait with pushing into PT hand with LUE with improved step length min A; pt had 2 hr rest break in bed since morning therapy and did well with activity tolerance and more consistent in ability than yesterday when he was up all day, encouraged pt to go back to bed for rest breaks during day  Therapy/Group: Individual Therapy  [name] 12/02/2011, 1:58 PM

## 2011-12-02 NOTE — Progress Notes (Signed)
Remained continent of bowel and bladder during the noc, per report. Initiating timed toileting. Skin tear x 2 to penile shaft, applying neosporin to area. Pain controlled with scheduled Tylenol 500mg  and Muscle Rub to lower back. No unsafe behavior exhibited. Calls appropriately for assist.

## 2011-12-02 NOTE — Progress Notes (Signed)
Occupational Therapy Session Note  Patient Details  Name: Charles Tapia MRN: [id number] Date of Birth: [birth year]  Today's Date: 12/02/2011 Time: 0800-0900 Time Calculation (min): 60 min  Precautions: Precautions Precautions: Fall Restrictions Weight Bearing Restrictions: No  Short Term Goals: OT Short Term Goal 1: Will perform bath with min assist in sit and stand OT Short Term Goal 2: Will perform UB dressing with button down shirt with mod assist OT Short Term Goal 3: Will perform LB dressing with mod assist in sit and stand OT Short Term Goal 4: Will perform shower transfer with min assist using DME. OT Short Term Goal 5: Will visually attend to LUE during BADL tasks with mod cues  Skilled Therapeutic Interventions/Progress Updates:  Self care retraining to include bed mobility, sponge bath and dress EOB in unsupported sit and in stand.  Focus session on use of LUE for sidely to sit, sitting balance using LUE for support - patient required supervision-mod assist with sitting and standing balance.  Patient continent with urinating while standing, stating "I'm going to need the bottle", small open sore noted on penis, RN and MD aware.  Pain Pain Assessment Pain Assessment: No/denies pain  Therapy/Group: Individual Therapy  [name] 12/02/2011, 11:41 AM

## 2011-12-02 NOTE — Progress Notes (Signed)
Patient ID: Charles Tapia, male   DOB: [birth year], 78 y.o.   MRN: [id number] Subjective/Complaints:  No issues reported  Review of Systems  Respiratory: Negative for cough and wheezing.   Cardiovascular: Negative for chest pain.  All other systems reviewed and are negative.     Objective:  Vital Signs:  Blood pressure 151/68, pulse 62, temperature 97.5 F (36.4 C), temperature source Oral, resp. rate 18, SpO2 96.00%.  Last 5 Results:   Lab  11/28/11 0620  11/22/11 0400   WBC  8.1  8.4   HGB  11.3*  12.0*   HCT  33.3*  35.4*   PLT  194  161   NA  130*  137   K  5.3*  4.9   CL  94*  104   CO2  29  23   GLUCOSE  116*  106*   BUN  54*  34*   CREATININE  1.60*  1.26   CALCIUM  9.3  9.6    Physical Exam: General appearance: alert and appears stated age  Resp: clear to auscultation bilaterally  Cardio: regular rate and rhythm  GI: soft, non-tender; bowel sounds normal; no masses, no organomegaly  Extremities: extremities normal, atraumatic, no cyanosis or edema  Pulses: 2+ and symmetric  Skin: Skin color, texture, turgor normal. No rashes or lesions  Neurologic: Alert. Speech dysarthric but quite intelligible.  Left HP 2/5 in LUE and 3/5 in LLE.  DTR's 1+ . LUE 0 to 1/5.  Has limitations in R shoulder movement as well due to chronic arthritis.  Assessment/Plan:  1. 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  Physiatrist is providing close team supervision and 24 hour management of active medical problems listed below.  Physiatrist and rehab team continue to assess barriers to discharge/monitor patient progress toward functional and medical goals.  FIM:    FIM - Toileting  Toileting: 1: Total-Patient completed zero steps, helper did all 3  FIM - Therapist, nutritional Devices: Systems developer Transfers: 3-To toilet/BSC: Mod A (lift or  lower assist)    Comprehension  Comprehension Mode: Auditory  Comprehension: 5-Set-up assist with hearing device  Expression  Expression Mode: Verbal  Expression: 5-Expresses complex 90% of the time/cues < 10% of the time  Social Interaction  Social Interaction: 6-Interacts appropriately with others with medication or extra time (anti-anxiety, antidepressant).  Problem Solving  Problem Solving: 5-Solves basic problems: With no assist  Memory  Memory: 5-Recognizes or recalls 90% of the time/requires cueing < 10% of the time    2. Anticoagulation/DVT prophylaxis with Pharmaceutical: PAS hose, mobility.  3. Pain Management: monitor on scheduled tylenol. Use biofreeze to back and hips as at home. Ultram prn.   4. Mood: pleasant and motivated. Monitor for now.  5. HTN: monitor with bid checks. Continue metoprolol. Avoid ace for now.  6. Hyperlipidemia: On zocor daily for treatment.  7. Lumbago due to spinal stenosis/HNP: monitor for symptoms with increase in activity levels. Denies significant pain at present. 8. Chronic constipation: Continue home dose miralax with additional senna for now. Good BM yesterday 9. Monitor for seizure activity moderate risk given his history of putaminal hemorrhage   10. Acute renal failure with hyperkalemia: Improving, am BUN 36  11. URinary incont improving has skin tear per RN on meatus , D/C condom cath and use neosporin oint

## 2011-12-02 NOTE — Progress Notes (Signed)
Progress Notes  Speech Language Pathology Therapy Note  Patient Details  Name: Treyce Spillers MRN: [id number] Date of Birth: 1917-12-03  Today's Date: 12/02/2011 Time: 0900-0930 Time Calculation (min): 30 min  Precautions: Precautions Precautions: Fall Restrictions Weight Bearing Restrictions: No  Short Term Goals: 11/28/11  1. Patient will utilize speech intelligibility strategies at the conversational speech level with minimal verbal/visual cues  2. Patient will use external memory aids to recall daily information with minimal prompts  3. Patient will utilize hearing/processing strategies by requesting repetition or simplification of information with minimal verbal/visual cues  4. Patient will complete familiar, complex time/medication/money management tasks with supervision verbal cues  Skilled Therapeutic Interventions/Progress Updates: Session focused on medication management with generation of medication list due to changes in medication; SLP facilitated session with supervision semantic cues with changes x2 and recall of one medication's function from PTA.  Patient with questions an differed specific medical questions to PA.   Pain Pain Assessment Pain Assessment: No/denies pain Pain Score: 0-No pain  Therapy/Group: Individual Therapy  Charlane Ferretti., CCC-SLP [phone]  Kamron Vanwyhe 12/02/2011 12:43 PM

## 2011-12-03 MED ORDER — DOXAZOSIN MESYLATE 1 MG PO TABS
1.0000 mg | ORAL_TABLET | Freq: Every day | ORAL | Status: DC
  Administered 2011-12-03: 1 mg via ORAL
  Filled 2011-12-03 (×3): qty 1

## 2011-12-03 NOTE — Progress Notes (Addendum)
Recreational Therapy Assessment and Plan  Patient Details  Name: Charles Tapia MRN: [id number] Date of Birth: [birth year]  Rehab Potential: Good ELOS: 14 days   Assessment Clinical Impression:Patient is a 78 y.o. 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 Patient transferred to CIR on 11/27/2011 . Patient's past medical history is significant for .  Past Medical History   Diagnosis  Date   .  Hypertension    .  Cardiomyopathy    .  CAD (coronary artery disease)    .  Gout    .  Hyperlipidemia     Chronic LBP (ESI- month ago-Dr. [place] radiculopathy RLE    .  Mitral regurgitation     Past Surgical History   Procedure  Date   .  Coronary bypass graft stenosis    .  Coronary artery bypass graft    .  Appendectomy    .  Back surgery      Pt presents with decreased activity tolerance, decreased functional mobility, decreased balance, left sided weakness limiting pt's independence with leisure/community pursuits.   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) Other Leisure Interests: Television;Reading Leisure Participation Style: With Family/Friends Awareness of Community Resources: Good-identify 3 post discharge leisure resources [employer] Appropriate for Education?: Yes Social interaction - Mood/Behavior: Cooperative Recreational Therapy Orientation Orientation -Reviewed with patient: Available activity resources Strengths/Weaknesses Patient Strengths/Abilities: Willingness to participate;Active premorbidly Patient weaknesses: Physical limitations  Plan Rec Therapy Plan Is patient appropriate for Therapeutic Recreation?: Yes Rehab Potential: Good Treatment times per week: Min 1 time per week >20 minutes Estimated Length of  Stay: 14 days Therapy Goals Achieved By:: Recreation/leisure participation;Community reintegration/education;1:1 session;Adaptive equipment instruction;Patient/family education  Recommendations for other services: None  Discharge Criteria: Patient will be discharged from TR if patient refuses treatment 3 consecutive times without medical reason.  If treatment goals not met, if there is a change in medical status, if patient makes no progress towards goals or if patient is discharged from hospital.  The above assessment, treatment plan, treatment alternatives and goals were discussed and mutually agreed upon: by patient  Login Muckleroy 12/03/2011, 12:15 PM

## 2011-12-03 NOTE — Progress Notes (Signed)
Patient ID: Charles Tapia, male   DOB: 12-Mar-1918, 78 y.o.   MRN: [id number] Subjective/Complaints:  Trying to drink more Review of Systems  Respiratory: Negative for cough and wheezing.   Cardiovascular: Negative for chest pain.  All other systems reviewed and are negative.     Objective:  Vital Signs:  Blood pressure 151/68, pulse 62, temperature 97.5 F (36.4 C), temperature source Oral, resp. rate 18, SpO2 96.00%.  Last 5 Results:   Lab  11/28/11 0620  11/22/11 0400   WBC  8.1  8.4   HGB  11.3*  12.0*   HCT  33.3*  35.4*   PLT  194  161   NA  130*  137   K  5.3*  4.9   CL  94*  104   CO2  29  23   GLUCOSE  116*  106*   BUN  54*  34*   CREATININE  1.60*  1.26   CALCIUM  9.3  9.6    Physical Exam: General appearance: alert and appears stated age  Resp: clear to auscultation bilaterally  Cardio: regular rate and rhythm  GI: soft, non-tender; bowel sounds normal; no masses, no organomegaly  Extremities: extremities normal, atraumatic, no cyanosis or edema  Pulses: 2+ and symmetric  Skin: Skin color, texture, turgor normal. No rashes or lesions  Neurologic: Alert. Speech dysarthric but quite intelligible.  Left HP 2/5 in LUE and 3/5 in LLE.  DTR's 1+ . LUE 0 to 1/5.  Has limitations in R shoulder movement as well due to chronic arthritis.  Assessment/Plan:  1. 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  Physiatrist is providing close team supervision and 24 hour management of active medical problems listed below.  Physiatrist and rehab team continue to assess barriers to discharge/monitor patient progress toward functional and medical goals.  FIM:    FIM - Toileting  Toileting: 1: Total-Patient completed zero steps, helper did all 3  FIM - Therapist, nutritional Devices: Systems developer Transfers: 3-To toilet/BSC: Mod A (lift or  lower assist)    Comprehension  Comprehension Mode: Auditory  Comprehension: 5-Set-up assist with hearing device  Expression  Expression Mode: Verbal  Expression: 5-Expresses complex 90% of the time/cues < 10% of the time  Social Interaction  Social Interaction: 6-Interacts appropriately with others with medication or extra time (anti-anxiety, antidepressant).  Problem Solving  Problem Solving: 5-Solves basic problems: With no assist  Memory  Memory: 5-Recognizes or recalls 90% of the time/requires cueing < 10% of the time    2. Anticoagulation/DVT prophylaxis with Pharmaceutical: PAS hose, mobility.  3. Pain Management: monitor on scheduled tylenol. Use biofreeze to back and hips as at home. Ultram prn.   4. Mood: pleasant and motivated. Monitor for now.  5. HTN: monitor with bid checks. Continue metoprolol. Avoid ace for now. D/c flomax change to cardura to improve systolic control 6. Hyperlipidemia: On zocor daily for treatment.  7. Lumbago due to spinal stenosis/HNP: monitor for symptoms with increase in activity levels. Denies significant pain at present. 8. Chronic constipation: Continue home dose miralax with additional senna for now. Good BM yesterday 9. Monitor for seizure activity moderate risk given his history of putaminal hemorrhage   10. Acute renal failure with hyperkalemia: Improving, am BUN 36  11. URinary incont improving has skin tear per RN on meatus , D/C condom cath and use  neosporin oint

## 2011-12-03 NOTE — Progress Notes (Signed)
Social Work Patient ID: Charles Tapia, male   DOB: [birth year], 77 y.o.   MRN: [id number]  Met with pt, daughter and friend to inform of team conference-goals supervision level and discharge date 2/7. Discussed with daughter coming in Wed 2/6 day before discharge to go through therapies.  She plans to go back to her Home today then return.  They have hired 24 hour care at home, someone is staying with her Mom/Pt's wife now. Both pleased with discharge and pt's goals. Pt is aware he needs to work on his balance issues.  Discussed Home Health follow up And will await DME recommendations.  Continue to follow and work toward discharge.

## 2011-12-03 NOTE — Patient Care Conference (Signed)
Inpatient RehabilitationTeam Conference Note Date: 12/03/2011   Time: 11:20 AM   Patient Name: Charles Tapia      Medical Record Number: [id number]  Date of Birth: 1918/07/15 Sex: Male         Room/Bed: 4145/4145-01 Payor Info: Payor: BLUE CROSS BLUE [employer] OF [place] MEDICARE  Plan: BLUE MEDICARE  Product Type: *No Product type*     Admitting Diagnosis: RT ICH  Admit Date/Time:  11/27/2011  4:00 PM Admission Comments: No comment available   Primary Diagnosis:  Intracerebral hemorrhage Principal Problem: Intracerebral hemorrhage  Patient Active Problem List  Diagnoses Date Noted  . Intracerebral hemorrhage 11/22/2011  . Hypertension 11/22/2011  . Hyperlipidemia 11/22/2011  . Coronary artery disease 11/22/2011  . Gout 11/22/2011  . Cardiomyopathy 11/22/2011  . Mitral regurgitation 11/22/2011    Expected Discharge Date: Expected Discharge Date: 12/11/11  Team Members Present: Physician: Dr. Nat Math, RN Case Manager Present: Lutricia Horsfall, RN Social Worker Present: Dossie Der, LCSW PT Present: Edman Circle, PT OT Present: Leonette Monarch, Felipa Eth, OT SLP Present: Fae Pippin, SLP     Current Status/Progress Goal Weekly Team Focus  Medical   uncontrolled systolic HT, acute on chronic renal failure  reanal fxn at baseline, systolic BP<145  adjust meds, d/c IVF   Bowel/Bladder   Continent of bowel and bladder  Continent of bowel and bladder  Monitor   Swallow/Nutrition/ Hydration             ADL's   Overall Moderate Assist with BADL  Overall Supervision  Self care retraining, balance activities, left inattention, LUE neuromuscular reeducation, pt/family education   Mobility   gait- up to mod A with fatigue and distraction, in general min A 200 ft, transfers mod A to close S with intermittent posterior lean  S ambulatory with LRAD      Communication   supervision   modified independent  increase vocal intensity; self monitor     Safety/Cognition/ Behavioral Observations  supervision-min assist  supervision  increase recall of new information   Pain   Pain controlled with scheduled Tylenol 500mg , and Muscle Rub to lower back  <3  Monitor   Skin   Skin tear to penile shaft, applying topical neosporin  No additional skin breakdown  Monitor area for appropriate healing    Rehab Goals Patient on target to meet rehab goals: Yes *See Interdisciplinary Assessment and Plan and progress notes for long and short-term goals  Barriers to Discharge: see above    Possible Resolutions to Barriers:  see above    Discharge Planning/Teaching Needs:  Home with 24 hour caregiver, wife also requires care at home. Daughte ris working on plan and has been here to observe in therapies.      Team Discussion:  Discussion of pt's dx. Adjusting BP med. Pt incontinent of urine x1, otherwise continent. Pt's family working on hiring caregiver in preparation for d/c.  Revisions to Treatment Plan:  none   Continued Need for Acute Rehabilitation Level of Care: The patient requires daily medical management by a physician with specialized training in physical medicine and rehabilitation for the following conditions: Daily direction of a multidisciplinary physical rehabilitation program to ensure safe treatment while eliciting the highest outcome that is of practical value to the patient.: Yes Daily medical management of patient stability for increased activity during participation in an intensive rehabilitation regime.: Yes Daily analysis of laboratory values and/or radiology reports with any subsequent need for medication adjustment of medical intervention for :  Cardiac problems;Neurological problems  Meryl Dare 12/03/2011, 4:57 PM

## 2011-12-03 NOTE — Progress Notes (Signed)
Occupational Therapy Session Note  Patient Details  Name: Jebediah Macrae MRN: [id number] Date of Birth: 05/16/1918  Today's Date: 12/03/2011 Time: 0800-0900 Time Calculation (min): 60 min  Precautions: Precautions Precautions: Fall Restrictions Weight Bearing Restrictions: No  Short Term Goals: OT Short Term Goal 1: Will perform bath with min assist in sit and stand OT Short Term Goal 2: Will perform UB dressing with button down shirt with mod assist OT Short Term Goal 3: Will perform LB dressing with mod assist in sit and stand OT Short Term Goal 4: Will perform shower transfer with min assist using DME. OT Short Term Goal 5: Will visually attend to LUE during BADL tasks with mod cues  Skilled Therapeutic Interventions:  No c/o pain. Self care retraining to include shower, dress, groom and toileting.  Focus session on increased attention and use of LUE with bed mobility, BADL tasks and functional mobility, sitting and standing balance activities, shower transfer in walk in shower in patient's room. Patient has been max assist with button down shirt yet today he was min assist with pull over shirt. Will call family to bring in more pull over shirts.  Therapy/Group: Individual Therapy  Zachrey Deutscher 12/03/2011, 11:20 AM

## 2011-12-03 NOTE — Progress Notes (Signed)
Physical Therapy Session Note  Patient Details  Name: Charles Tapia MRN: [id number] Date of Birth: [birth year]  Today's Date: 12/03/2011 Time: 4098-1191 Time Calculation (min): 44 min  Precautions: Precautions Precautions: Fall Restrictions Weight Bearing Restrictions: No  Short Term Goals:  Same as LTGs  Skilled Therapeutic Interventions/Progress Updates:    Dynamic standing balance and reactions training.  Pt much improved from 1/28 with gait, no longer leaning posteriorly and into therapist.  Pt instructed therapist to let him push into her hand on the left, because it helped her walk better.  Pain Pain Assessment Pain Assessment: 0-10 Pain Score:   3 Mobility Transfers Sit to Stand: 4: Min assist Stand to Sit: 4: Min assist Locomotion  Ambulation Ambulation/Gait Assistance: 4: Min assist Ambulation Distance (Feet): 100 Feet, 50', and 150' Assistive device: 1 person hand held assist (L side) Gait Gait Pattern: Decreased stride length;Decreased dorsiflexion - right;Decreased dorsiflexion - left (mild hip and knee flexion) Stairs / Additional Locomotion Stairs: Yes Stairs Assistance: 4: Min assist Stair Management Technique: Two rails Number of Stairs: 10  Height of Stairs: 6   Exercises Cardiovascular Exercises Kinetron: Standing 20 cm2 for LE strengthening and balance training. (difficult for pt to hold head up while performing, x 3)  Other Treatments Treatments Therapeutic Activity: Standing on foam balance beam performing UE theraband exercises to challenge ankle and hip balance strategies.  Tried standing on bosu, pt unable to let go with UEs.  Therapy/Group: Individual Therapy  [name] 12/03/2011, 3:30 PM

## 2011-12-03 NOTE — Progress Notes (Signed)
Patient Details  Name: Charles Tapia MRN: [id number] Date of Birth: January 28, 1918  Today's Date: 12/03/2011 Time: 10:10-11 Skilled Therapeutic Interventions/Progress Updates: Pt participated in tabletop UNO game performing sit-stands & squats while using LUE to manipulate cards.  Pt required supervision to min assist for balance and min verbal cues to attend to LUE duing task.  No c/o pain.  Therapy/Group: Other: co-treat with OT  Activity Level: Simple:  Level of assist: Supervision- Min assist  Jolan Upchurch 12/03/2011, 12:24 PM

## 2011-12-03 NOTE — Plan of Care (Signed)
Problem: RH KNOWLEDGE DEFICIT Goal: RH STG INCREASE KNOWLEDGE OF DYSPHAGIA/FLUID INTAKE Patient will be able to state understanding of dysphagia diet and thickened liquids and  Patient will be able to state safety precautions for dysphagia diet and interventions with diet  Outcome: Completed/Met Date Met:  12/03/11 Pt on heart healthy diet thin liquids

## 2011-12-03 NOTE — Progress Notes (Signed)
Occupational Therapy Session Note  Patient Details  Name: Charles Tapia MRN: [id number] Date of Birth: [birth year]  Today's Date: 12/03/2011 Time: 1005-1100 Time Calculation (min): 55 min  Precautions: Precautions Precautions: Fall Restrictions Weight Bearing Restrictions: No  Short Term Goals: OT Short Term Goal 1: Will perform bath with min assist in sit and stand OT Short Term Goal 2: Will perform UB dressing with button down shirt with mod assist OT Short Term Goal 3: Will perform LB dressing with mod assist in sit and stand OT Short Term Goal 4: Will perform shower transfer with min assist using DME. OT Short Term Goal 5: Will visually attend to LUE during BADL tasks with mod cues  Skilled Therapeutic Interventions:  LUE and LLE Neuromuscular re-education and attention to LUE with focus on forced use of LUE during functional tasks while in dynamic sitting, squatting, and standing, gross and fine motor coordination, sit<>stand multiple times with forced use of LUE and LLE.  Pain No reports of pain  Therapy/Group: Co-Treatment with TR  Maudry Zeidan 12/03/2011, 5:00 PM

## 2011-12-03 NOTE — Progress Notes (Signed)
Progress Notes  Speech Language Pathology Therapy Note  Patient Details  Name: Charles Tapia MRN: [id number] Date of Birth: 1917-12-05  Today's Date: 12/03/2011 Time: 4098-1191 Time Calculation (min): 45 min  Precautions: Precautions Precautions: Fall Restrictions Weight Bearing Restrictions: No  Short Term Goals: 11/28/11  1. Patient will utilize speech intelligibility strategies at the conversational speech level with minimal verbal/visual cues  2. Patient will use external memory aids to recall daily information with minimal prompts  3. Patient will utilize hearing/processing strategies by requesting repetition or simplification of information with minimal verbal/visual cues  4. Patient will complete familiar, complex time/medication/money management tasks with supervision verbal cues  Skilled Therapeutic Interventions/Progress Updates: Session focused on cognitive compensation techniques with medication and financial management tasks.  SLP facilitated session with minimal assist semantic cues for to assist with recall of medications and functions; patient was modified independent with writing checks accurately; however, required moderate assist semantic cues to problem solve how to self correct errors.    Pain Pain Assessment Pain Assessment: No/denies pain  Therapy/Group: Individual Therapy  Charlane Ferretti., CCC-SLP [phone]  [name] 12/03/2011 1:54 PM

## 2011-12-03 NOTE — Plan of Care (Signed)
Problem: RH KNOWLEDGE DEFICIT Goal: RH STG INCREASE KNOWLEDGE OF HYPERTENSION Patient will be able to state signs and symptoms of HTN and medications used to manage  Outcome: Progressing Discussed with patient his antihypertensive medications

## 2011-12-04 DIAGNOSIS — G8110 Spastic hemiplegia affecting unspecified side: Secondary | ICD-10-CM

## 2011-12-04 DIAGNOSIS — Z5189 Encounter for other specified aftercare: Secondary | ICD-10-CM

## 2011-12-04 DIAGNOSIS — I619 Nontraumatic intracerebral hemorrhage, unspecified: Secondary | ICD-10-CM

## 2011-12-04 MED ORDER — DOXAZOSIN MESYLATE 2 MG PO TABS
2.0000 mg | ORAL_TABLET | Freq: Every day | ORAL | Status: DC
  Administered 2011-12-04 – 2011-12-10 (×7): 2 mg via ORAL
  Filled 2011-12-04 (×8): qty 1

## 2011-12-04 NOTE — Progress Notes (Signed)
Physical Therapy Note  Patient Details  Name: Charles Tapia MRN: [id number] Date of Birth: [birth year] Today's Date: 12/04/2011  1030-1110 (40 minutes) individual treatment session Pain- no complaint of pain Focus of treatment: Therapeutic activities to improve protective balance reactions; gait training on level Treatment: Gait 80 feet X 2 without AD min assist with decreased step length bilaterally; standing stepping forward/backward against resistance (green theraband) to challenge balance reactions; rhythmic stabilization (at hips) in shoulder width stance and tandem stance. Pt easily displaced posteriorly in tandem stance.; stepping forward/sideways and backwards to target min assist with decreased step length left LE.   Teneil Shiller,JIM 12/04/2011, 10:47 AM

## 2011-12-04 NOTE — Progress Notes (Signed)
Physical Therapy Note  Patient Details  Name: Dyke Weible MRN: [id number] Date of Birth: 1917-12-06 Today's Date: 12/04/2011  Time: 1430-1513 43 minutes  No c/o pain.  Bathroom mobility with min A for balance and transfers, supervision for safety.  Gait with HHA on L with min A, no LOB in controlled environment.  Stretching with manual assistance to increase trunk extension for improved posture and performance with gait and mobility.  Gait with resistance at hips with focus on increasing L hip anterior translation in mid and terminal stance.  Pt with improved gait with resistance at hips and with increased cadence.  Standing rhythmic stabilization at hips with more frequent LOB posteriorly with mod A to correct.  Noted decreased balance reactions.  Ramp/curb training as pt states he has ramp at home.  Min A with L HHA. Pt requires cues for placing L LE all the way onto step before advancing R LE.  Overall good activity tolerance today.  Individual therapy     Toma Erichsen 12/04/2011, 3:17 PM

## 2011-12-04 NOTE — Progress Notes (Signed)
Speech Language Pathology Daily Session Note  Patient Details  Name: Charles Tapia MRN: [id number] Date of Birth: Aug 04, 1918  Today's Date: 12/04/2011 Time: 9528-4132 Time Calculation (min): 45 min  Short Term Goals: 11/28/11  1. Patient will utilize speech intelligibility strategies at the conversational speech level with minimal verbal/visual cues  2. Patient will use external memory aids to recall daily information with minimal prompts  3. Patient will utilize hearing/processing strategies by requesting repetition or simplification of information with minimal verbal/visual cues  4. Patient will complete familiar, complex time/medication/money management tasks with supervision verbal cues  Skilled Therapeutic Interventions: Session focused on financial management task; SLP facilitated set up of hearing devices and reduced environmental distractions and patient wrote checks and balanced check register with increased wait time.  Patient asked SLP to repeat verbal expression x1to facilitate his understanding and was completely intelligible in a quiet environment.  Patient required supervision semantic cues to utilize memory aid (medication chart) to recall new medications and functions.  Patient would benefit from skilled SLP services to address functionally using chart to load medication box.   Daily Session Precautions/Restrictions  Precautions Precautions: Fall Precaution Comments: Left inattention expecially of LUE Required Braces or Orthoses: No Restrictions Weight Bearing Restrictions: No FIM:  Comprehension Comprehension Mode: Auditory Comprehension: 5-Set-up assist with hearing device Expression Expression Mode: Verbal Expression: 5-Expresses complex 90% of the time/cues < 10% of the time Social Interaction Social Interaction: 6-Interacts appropriately with others with medication or extra time (anti-anxiety, antidepressant). Problem Solving Problem Solving: 6-Solves complex  problems: With extra time Memory Memory: 5-Requires cues to use assistive device FIM - Eating Eating Activity: 5: Set-up assist for cut food  Pain  0  Therapy/Group: Individual Therapy  Charlane Ferretti., CCC-SLP [phone]  [name] 12/04/2011, 2:06 PM

## 2011-12-04 NOTE — Progress Notes (Signed)
Occupational Therapy Session Note  Patient Details  Name: Welden Hausmann MRN: [id number] Date of Birth: 19-Aug-1918  Today's Date: 12/04/2011 Time: 0800-0900 Time Calculation (min): 60 min  Precautions: Precautions: Fall Precaution Comments: Left inattention expecially of LUE Required Braces or Orthoses: No Weight Bearing Restrictions: No  Short Term Goals: OT Short Term Goal 1: Will perform bath with min assist in sit and stand OT Short Term Goal 2: Will perform UB dressing with button down shirt with mod assist OT Short Term Goal 3: Will perform LB dressing with mod assist in sit and stand OT Short Term Goal 4: Will perform shower transfer with min assist using DME. OT Short Term Goal 5: Will visually attend to LUE during BADL tasks with mod cues  Skilled Therapeutic Interventions/Progress Updates:  C/O mild pain in lower back, not rated, declined medication, rest and activity helped to decrease pain.  Self care retraining to include sponge bath at EOB (patient declined shower today), dressing and grooming at sink.  Focus session on forced use of LUE with bed mobility, sit<>stand, all balance activities, and BADL tasks.  Therapy/Group: Individual Therapy  Susumu Hackler 12/04/2011, 10:17 AM

## 2011-12-04 NOTE — Progress Notes (Signed)
Occupational Therapy Weekly Progress Note  Patient Details  Name: Charles Tapia MRN: [id number] Date of Birth: Jan 27, 1918  Today's Date: 12/04/2011  Patient has met 4 of 5 short term goals.  UB dressing goal was for a button down shirt which is very difficult for patient.  Patient is minimal assist for pull over shirt.  Patient continues to demonstrate the following deficits: activity tolerance, dynamic sitting and standing balance, functional use of LUE secondary hemiparesis,  left visual and spatial inattention, and therefore will continue to benefit from skilled OT intervention to enhance overall performance with ADL.  Patient progressing toward long term goals..  Continue plan of care.  OT Short Term Goals OT Short Term Goal 1: Will perform bath with min assist in sit and stand OT Short Term Goal 1 - Progress: Met OT Short Term Goal 2: Will perform UB dressing with button down shirt with mod assist OT Short Term Goal 2 - Progress: Not met (min assist with pull over shirt) OT Short Term Goal 3: Will perform LB dressing with mod assist in sit and stand OT Short Term Goal 3 - Progress: Met OT Short Term Goal 4: Will perform shower transfer with min assist using DME. OT Short Term Goal 4 - Progress: Met OT Short Term Goal 5: Will visually attend to LUE during BADL tasks with mod cues OT Short Term Goal 5 - Progress: Met  NEW STG: OT Short Term Goal 1: STG =LTGs secondary to discharge 12/11/11,  (LTG overall supervision-minimal assist for BADL)  Therapy Documentation Precautions: Precautions Precautions: Fall Precaution Comments: Left inattention expecially of LUE Required Braces or Orthoses: No Restrictions Weight Bearing Restrictions: No  Skilled Therapeutic Interventions/Progress Updates:  Ambulation/gait training;Balance/vestibular training;DME/adaptive equipment instruction;Functional mobility training;Self Care/advanced ADL retraining;Patient/family education;Neuromuscular  re-education;Therapeutic Activities;Therapeutic Exercise;UE/LE Strength taining/ROM;Visual/perceptual remediation/compensation;UE/LE Coordination activities   Pain No complaints of pain  ADL ADL Eating: Set up Where Assessed-Eating: Chair Grooming: Moderate cueing;Minimal assistance (occasional steady assist standing at sink) Where Assessed-Grooming: Standing at sink Upper Body Bathing: Minimal assistance Where Assessed-Upper Body Bathing: Edge of bed;Shower Lower Body Bathing: Minimal assistance (occasional steady assist in standing) Where Assessed-Lower Body Bathing: Edge of bed;Shower Upper Body Dressing: Maximal assistance (button down shirt, minimum ssist with pull over shirt) Where Assessed-Upper Body Dressing: Edge of bed;Chair Lower Body Dressing: Moderate assistance Where Assessed-Lower Body Dressing: Edge of bed;Chair (sit & stand) Toileting: Minimal assistance (occasional steady assist) Where Assessed-Toileting: Teacher, adult education: Curator Method: Surveyor, minerals: Raised toilet seat;Grab bars Film/video editor: Insurance underwriter Method: Warden/ranger: Transfer tub bench;Grab bars  Other Treatments  Neuromuscular Facilitation: Forced use;Left;Upper Extremity;Activity to increase coordination;Activity to increase motor control;Activity to increase timing and sequencing;Activity to increase grading;Activity to increase sustained activation;Activity to increase lateral weight shifting Scapular Mobilization: mild atrophy noted in supraspinatus, mild medial border winging, PROM WFL for scapular mobilizations  Kateena Degroote 12/04/2011, 11:01 AM

## 2011-12-04 NOTE — Progress Notes (Signed)
Patient ID: Charles Tapia, male   DOB: 06/11/1918, 78 y.o.   MRN: [id number] Subjective/Complaints:  TRying no briefs today, using adaptive shoe laces Review of Systems  Respiratory: Negative for cough and wheezing.   Cardiovascular: Negative for chest pain.  All other systems reviewed and are negative.     Objective:  Vital Signs:  See vitals section, still with elevated systolic BP  Last 5 Results:   Lab  11/28/11 0620  11/22/11 0400   WBC  8.1  8.4   HGB  11.3*  12.0*   HCT  33.3*  35.4*   PLT  194  161   NA  130*  137   K  5.3*  4.9   CL  94*  104   CO2  29  23   GLUCOSE  116*  106*   BUN  54*  34*   CREATININE  1.60*  1.26   CALCIUM  9.3  9.6    Physical Exam: General appearance: alert and appears stated age  Resp: clear to auscultation bilaterally  Cardio: regular rate and rhythm  GI: soft, non-tender; bowel sounds normal; no masses, no organomegaly  Extremities: extremities normal, atraumatic, no cyanosis or edema  Pulses: 2+ and symmetric  Skin: Skin color, texture, turgor normal. No rashes or lesions  Neurologic: Alert. Speech dysarthric but quite intelligible.  Left HP 2/5 in LUE and 3/5 in LLE.  DTR's 1+   Has limitations in R shoulder movement as well due to chronic arthritis.  Assessment/Plan:  1. 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  Physiatrist is providing close team supervision and 24 hour management of active medical problems listed below.  Physiatrist and rehab team continue to assess barriers to discharge/monitor patient progress toward functional and medical goals.  FIM:    FIM - Toileting  Toileting: 1: Total-Patient completed zero steps, helper did all 3  FIM - Therapist, nutritional Devices: Systems developer Transfers: 3-To toilet/BSC: Mod A (lift or lower assist)    Comprehension  Comprehension  Mode: Auditory  Comprehension: 5-Set-up assist with hearing device  Expression  Expression Mode: Verbal  Expression: 5-Expresses complex 90% of the time/cues < 10% of the time  Social Interaction  Social Interaction: 6-Interacts appropriately with others with medication or extra time (anti-anxiety, antidepressant).  Problem Solving  Problem Solving: 5-Solves basic problems: With no assist  Memory  Memory: 5-Recognizes or recalls 90% of the time/requires cueing < 10% of the time    2. Anticoagulation/DVT prophylaxis with Pharmaceutical: PAS hose, mobility.  3. Pain Management: monitor on scheduled tylenol. Use biofreeze to back and hips as at home. Ultram prn.   4. Mood: pleasant and motivated. Monitor for now.  5. HTN: monitor with bid checks. Continue metoprolol. Avoid ace for now. D/c flomax change to cardura and increase dose to improve systolic control 6. Hyperlipidemia: On zocor daily for treatment.  7. Lumbago due to spinal stenosis/HNP: monitor for symptoms with increase in activity levels. Denies significant pain at present. 8. Chronic constipation: Continue home dose miralax with additional senna for now. Good BM yesterday 9. Monitor for seizure activity moderate risk given his history of putaminal hemorrhage   10. Acute renal failure with hyperkalemia: Improving, last BUN 36  11. URinary incont improving has skin tear per RN on meatus , D/C condom cath and use neosporin oint

## 2011-12-05 DIAGNOSIS — G8110 Spastic hemiplegia affecting unspecified side: Secondary | ICD-10-CM

## 2011-12-05 DIAGNOSIS — Z5189 Encounter for other specified aftercare: Secondary | ICD-10-CM

## 2011-12-05 DIAGNOSIS — I619 Nontraumatic intracerebral hemorrhage, unspecified: Secondary | ICD-10-CM

## 2011-12-05 NOTE — Progress Notes (Signed)
Occupational Therapy Session Note  Patient Details  Name: Charles Tapia MRN: [id number] Date of Birth: 03-29-1918  Today's Date: 12/05/2011 Time: 1130-1200 Time Calculation (min): 30 min  Precautions: Precautions Precautions: Fall Precaution Comments: Left inattention expecially of LUE Required Braces or Orthoses: No Restrictions Weight Bearing Restrictions: No  Short Term Goals: OT Short Term Goal 1: STG =LTGs secondary to discharge 12/11/11 OT Short Term Goal 1 - Progress: Other (comment) (LTG overall supervision-minimal assist for BADL) OT Short Term Goal 2: Will perform UB dressing with button down shirt with mod assist OT Short Term Goal 2 - Progress: Not met (min assist with pull over shirt) OT Short Term Goal 3: Will perform LB dressing with mod assist in sit and stand OT Short Term Goal 3 - Progress: Met OT Short Term Goal 4: Will perform shower transfer with min assist using DME. OT Short Term Goal 4 - Progress: Met OT Short Term Goal 5: Will visually attend to LUE during BADL tasks with mod cues OT Short Term Goal 5 - Progress: Met  Skilled Therapeutic Interventions/Progress Updates:    Selfcare retraining session sit to stand shower level. Pt encouraged to integrate the LUE into function.  Able to use as an active assist with supervision for bathing tasks.  Needs min assist to integrate functionally for dressing tasks.  Overall min assist for standing balance with peri washing and pulling pants over hips.  Also stood to perform grooming tasks at the sink.  Educated pt on proper technique for rolling and transitioning to sitting using a more normal movement pattern.  Also educated on FM activity of removing and putting on top to the toothpaste and deodorant using the LUE.   Vital Signs Therapy Vitals Pulse Rate: 108  (After standing at the sink.) Resp: 18  Patient Position, if appropriate: Sitting Oxygen Therapy SpO2: 97 % O2 Device: None (Room air) Pulse Oximetry Type:  Intermittent Pain Pain Assessment Pain Assessment: No/denies pain  ADL SEE FIM section  Exercises  Educated pt on FM exercises to increase function in the left hand.  Other Treatments    Therapy/Group: Individual Therapy  Sanaya Gwilliam OTR/L 12/05/2011, 12:18 PM

## 2011-12-05 NOTE — Plan of Care (Signed)
Problem: RH Other (Specify) Goal: RH LTG Other (Specify) Pt will gait up/down ramp for home entry with S. (added 12/05/11 cs)

## 2011-12-05 NOTE — Progress Notes (Signed)
Physical Therapy Weekly Progress Note and Treatment Note  Patient Details  Name: Charles Tapia MRN: [id number] Date of Birth: 12-16-1917  Today's Date: 12/05/2011 Time: 4098-1191 Time Calculation (min): 44 min  Patient has met 1 of 9 long term goals.  Short term goals not set due to estimated length of stay. D/C planned for 12/11/11 with some goals upgraded d/t good progress and information that pt uses ramp to enter home.  Patient continues to demonstrate the following deficits:tightness in hip and back leading to decreased functional extension, decreased postural control, decreased sititng and standing balance, L inattention mostly to UE, L trunk and extremity weakness, decreased overall activity tolerance, decreased mobility and increased fall risk and therefore will continue to benefit from skilled PT intervention to enhance overall performance with activity tolerance, balance, postural control, ability to compensate for deficits and functional use of  left upper extremity and left lower extremity.  See Patient's Care Plan for progression toward long term goals.  Patient progressing toward long term goals..  Continue plan of care.  Therapy Documentation Precautions: Precautions Precautions: Fall Precaution Comments: Left inattention expecially of LUE Required Braces or Orthoses: No Restrictions Weight Bearing Restrictions: No  Skilled Therapeutic Interventions/Progress Updates:    Repeated balance test; gait training with initiation of use of straight cane    Vital Signs Therapy Vitals Pulse Rate: 100  (with mobility) BP: 144/76 mmHg Oxygen Therapy SpO2: 97 % O2 Device: None (Room air) Pain Pain Assessment Pain Type: Chronic pain (OA) Pain Location: Knee Pain Orientation: Right Pain Intervention(s): RN made aware Mobility Bed Mobility Rolling Right: 7: Independent Rolling Left: 7: Independent Supine to Sit: 7: Independent (on regualar bed to L) Transfers Sit to Stand: 5:  Supervision Sit to Stand Details (indicate cue type and reason): min A without UE A after several reps dt decreased muscular endurance and knee pain on R Stand to Sit: 5: Supervision Stand to Sit Details: decreased weight on LLE at times Locomotion  Gait Gait Pattern: Decreased stance time - left;Step-through pattern;Decreased stride length;Lateral trunk lean to left Gait velocity: 10 m walk test 22.5 sec - neighborhood ambulator (with cane) Stairs / Additional Locomotion Ramp: 5: Supervision (with cane) Curb: 5: Supervision (curb close S x 3)   Gait training with and without straight cane (pt owns cane at home and verbalizes understanding he may need device at d/c), tried to hold cane in L hand to increase use but unable, did hold cane and carry but LOB requiring min A when he was switching it between hands while walking Trunk/Postural Assessment - L trunk collapses    Balance Standardized Balance Assessment Standardized Balance Assessment: Berg Balance Test Berg Balance Test Sit to Stand: Able to stand without using hands and stabilize independently Standing Unsupported: Able to stand safely 2 minutes Sitting with Back Unsupported but Feet Supported on Floor or Stool: Able to sit safely and securely 2 minutes Stand to Sit: Controls descent by using hands Transfers: Able to transfer safely, definite need of hands Standing Unsupported with Eyes Closed: Able to stand 10 seconds safely Standing Ubsupported with Feet Together: Able to place feet together independently and stand 1 minute safely From Standing, Reach Forward with Outstretched Arm: Reaches forward but needs supervision From Standing Position, Pick up Object from Floor: Able to pick up shoe, needs supervision From Standing Position, Turn to Look Behind Over each Shoulder: Looks behind one side only/other side shows less weight shift Turn 360 Degrees: Needs close supervision or verbal cueing Standing  Unsupported, Alternately  Place Feet on Step/Stool: Able to complete >2 steps/needs minimal assist Standing Unsupported, One Foot in Front: Able to plae foot ahead of the other independently and hold 30 seconds Standing on One Leg: Tries to lift leg/unable to hold 3 seconds but remains standing independently Total Score: 39  Patient demonstrates increased fall risk as noted by score of 39 /56 on Berg Balance Scale.  (<36= high risk for falls, close to 100%; 37-45 significant >80%; 46-51 moderate >50%; 52-55 lower >25%)  Exercises LAQ     Therapy/Group: Individual Therapy  [name] 12/05/2011, 9:59 AM

## 2011-12-05 NOTE — Plan of Care (Signed)
Problem: RH Other (Specify) Goal: RH LTG Other (Specify) Outcome: Progressing Pt will gait up/down ramp with S for home entry. Added 12/05/11 cs

## 2011-12-05 NOTE — Progress Notes (Signed)
Per State Regulation 482.30 This chart was reviewed for medical necessity with respect to the patient's Admission/Duration of stay. Pt participating in therapies with steady progress toward goals. IV d/c'd. Flomax d/c'd . Hypertensive. Cardura dose adjusted. Meryl Dare                 Nurse Care Manager             Next Review Date: 12/08/11

## 2011-12-05 NOTE — Plan of Care (Signed)
Problem: RH Furniture Transfers Goal: LTG Patient will perform furniture transfers w/assist (OT/PT Upgraded 12/05/11 cs

## 2011-12-05 NOTE — Progress Notes (Signed)
Physical Therapy Note  Patient Details  Name: Charles Tapia MRN: [id number] Date of Birth: 11-21-1917 Today's Date: 12/05/2011  11:30- 12:00 individual therapy pt denied pain gait into bathroom minguard assist with pt. Managing door and toilet seat. Pt performed all toileting task. Car transfer supervision with pt opening door pt entered left leg first safely. stair training x 8 with 2 rails for stregthening alternating pattern for weight shifts.    Julian Reil 12/05/2011, 11:59 AM

## 2011-12-05 NOTE — Progress Notes (Signed)
Patient ID: Charles Tapia, male   DOB: Feb 06, 1918, 78 y.o.   MRN: [id number] Subjective/Complaints:  TRying no briefs today, using adaptive shoe laces Review of Systems  Respiratory: Negative for cough and wheezing.   Cardiovascular: Negative for chest pain.  Back feels better this morning Appetite gradually improving. All other systems reviewed and are negative.     Objective:  Vital Signs:  See vitals section, still with elevated systolic BP   BMET    Component Value Date/Time   NA 131* 12/01/2011 0620   K 4.2 12/01/2011 0620   CL 95* 12/01/2011 0620   CO2 27 12/01/2011 0620   GLUCOSE 102* 12/01/2011 0620   BUN 36* 12/01/2011 0620   CREATININE 1.24 12/01/2011 0620   CALCIUM 9.7 12/01/2011 0620   GFRNONAA 49* 12/01/2011 0620   GFRAA 57* 12/01/2011 0620      Physical Exam: General appearance: alert and appears stated age  Resp: clear to auscultation bilaterally  Cardio: regular rate and rhythm  GI: soft, non-tender; bowel sounds normal; no masses, no organomegaly  Extremities: extremities normal, atraumatic, no cyanosis or edema  Pulses: 2+ and symmetric  Skin: Skin color, texture, turgor normal. No rashes or lesions  Neurologic: Alert. Speech dysarthric but quite intelligible.  Left HP 2/5 in LUE and 3/5 in LLE.  DTR's 1+   Has limitations in R shoulder movement as well due to chronic arthritis.  Assessment/Plan:  1. 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  Physiatrist is providing close team supervision and 24 hour management of active medical problems listed below.  Physiatrist and rehab team continue to assess barriers to discharge/monitor patient progress toward functional and medical goals.  FIM:    FIM - Toileting  Toileting: 1: Total-Patient completed zero steps, helper did all 3  FIM - Therapist, nutritional Devices: Research scientist (physical sciences) Transfers: 3-To toilet/BSC: Mod A (lift or lower assist)    Comprehension  Comprehension Mode: Auditory  Comprehension: 5-Set-up assist with hearing device  Expression  Expression Mode: Verbal  Expression: 5-Expresses complex 90% of the time/cues < 10% of the time  Social Interaction  Social Interaction: 6-Interacts appropriately with others with medication or extra time (anti-anxiety, antidepressant).  Problem Solving  Problem Solving: 5-Solves basic problems: With no assist  Memory  Memory: 5-Recognizes or recalls 90% of the time/requires cueing < 10% of the time   Hospital Problem List:  2. Anticoagulation/DVT prophylaxis with Pharmaceutical: PAS hose, mobility.   3. Pain Management: monitor on scheduled tylenol. Use biofreeze to back and hips as at home. Ultram prn.    4. Mood: pleasant and motivated. Monitor for now.   5. HTN: monitor with bid checks. Continue metoprolol. Avoid ace for now. D/c flomax change to cardura and increase dose to improve systolic control  6. Hyperlipidemia: On zocor daily for treatment.   7. Lumbago due to spinal stenosis/HNP: monitor for symptoms with increase in activity levels. Denies significant pain at present. Likes SCDs-helps with neuropathy symptoms.  8. Chronic constipation: Continue home dose miralax with additional senna for now. Good BM yesterday  9. Monitor for seizure activity moderate risk given his history of putaminal hemorrhage   10. Acute renal failure with hyperkalemia: Improving, last BUN 36.  Hyper kalemia resolved off lisinopril.  Nursing pushing fluids/offering supplements to help with nutrition and hydration.  11. URinary incont improving has skin tear per RN  on meatus , D/C condom cath and use neosporin oint

## 2011-12-05 NOTE — Plan of Care (Signed)
Problem: RH Bed to Chair Transfers Goal: LTG Patient will perform bed/chair transfers w/assist (PT) Upgraded 12/05/11 cs

## 2011-12-05 NOTE — Progress Notes (Signed)
Speech Language Pathology Daily Session Note  Patient Details  Name: Charles Tapia MRN: [id number] Date of Birth: 08/10/1918  Today's Date: 12/05/2011 Time: 4098-1191 Time Calculation (min): 55 min  Short Term Goals: 11/28/11  1. Patient will utilize speech intelligibility strategies at the conversational speech level with minimal verbal/visual cues  2. Patient will use external memory aids to recall daily information with minimal prompts  3. Patient will utilize hearing/processing strategies by requesting repetition or simplification of information with minimal verbal/visual cues  4. Patient will complete familiar, complex time/medication/money management tasks with supervision verbal cues  Skilled Therapeutic Interventions: Patient was able to direct SLP on how to change hearing aid battery and requested help as needed.  Patient uses low volume during verbal expression but was 100% intelligible in a quiet environment. Patient utilized medication chart to recall new medications, frequency and time while loading a medication box and SLP facilitated session with supervision verbal cues for patient to utilize chart to assist with recall of medication changes since hospital admission.  Patient, with increased wait time self monitored and corrected errors in 1/1 opportunities.    Daily Session Precautions/Restrictions    FIM:  Comprehension Comprehension Mode: Auditory Comprehension: 5-Set-up assist with hearing device Expression Expression Mode: Verbal Expression: 5-Expresses complex 90% of the time/cues < 10% of the time Social Interaction Social Interaction: 6-Interacts appropriately with others with medication or extra time (anti-anxiety, antidepressant). Problem Solving Problem Solving: 6-Solves complex problems: With extra time Memory Memory: 5-Requires cues to use assistive device FIM - Eating Eating Activity: 5: Set-up assist for open containers Pain Pain Assessment Pain  Assessment: No/denies pain Pain Score: 0-No pain  Therapy/Group: Individual Therapy  Charlane Ferretti., CCC-SLP [phone]  Charles Tapia 12/05/2011, 1:49 PM

## 2011-12-06 MED ORDER — METOPROLOL SUCCINATE ER 50 MG PO TB24
50.0000 mg | ORAL_TABLET | Freq: Two times a day (BID) | ORAL | Status: DC
  Administered 2011-12-06 – 2011-12-10 (×9): 50 mg via ORAL
  Filled 2011-12-06 (×11): qty 1

## 2011-12-06 MED ORDER — METOPROLOL SUCCINATE ER 100 MG PO TB24
100.0000 mg | ORAL_TABLET | Freq: Every day | ORAL | Status: DC
  Filled 2011-12-06 (×2): qty 1

## 2011-12-06 MED ORDER — METOPROLOL SUCCINATE ER 100 MG PO TB24
100.0000 mg | ORAL_TABLET | Freq: Every day | ORAL | Status: DC
  Filled 2011-12-06: qty 1

## 2011-12-06 NOTE — Progress Notes (Signed)
Patient ID: Charles Tapia, male   DOB: [birth year], 78 y.o.   MRN: [id number] Patient ID: Charles Tapia, male   DOB: 02/12/1918, 78 y.o.   MRN: [id number] Subjective/Complaints:  No new complaints TRying no briefs today, using adaptive shoe laces Review of Systems  Respiratory: Negative for cough and wheezing.   Cardiovascular: Negative for chest pain.  Back feels better this morning Appetite gradually improving. All other systems reviewed and are negative.     Objective:  Vital Signs:  See vitals section, still with elevated systolic BP   BMET    Component Value Date/Time   NA 131* 12/01/2011 0620   K 4.2 12/01/2011 0620   CL 95* 12/01/2011 0620   CO2 27 12/01/2011 0620   GLUCOSE 102* 12/01/2011 0620   BUN 36* 12/01/2011 0620   CREATININE 1.24 12/01/2011 0620   CALCIUM 9.7 12/01/2011 0620   GFRNONAA 49* 12/01/2011 0620   GFRAA 57* 12/01/2011 0620      Physical Exam: General appearance: alert and appears stated age  Resp: clear to auscultation bilaterally  Cardio: regular rate and rhythm  GI: soft, non-tender; bowel sounds normal; no masses, no organomegaly  Extremities: extremities normal, atraumatic, no cyanosis or edema  Pulses: 2+ and symmetric  Skin: Skin color, texture, turgor normal. No rashes or lesions  Neurologic: Alert. Speech dysarthric but quite intelligible.  Left HP 2/5 in LUE and 3/5 in LLE.  DTR's 1+   Has limitations in R shoulder movement as well due to chronic arthritis.  Assessment/Plan:  1. 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  Physiatrist is providing close team supervision and 24 hour management of active medical problems listed below.  Physiatrist and rehab team continue to assess barriers to discharge/monitor patient progress toward functional and medical goals.  FIM:    FIM - Toileting  Toileting: 1: Total-Patient completed zero steps,  helper did all 3  FIM - Therapist, nutritional Devices: Systems developer Transfers: 3-To toilet/BSC: Mod A (lift or lower assist)    Comprehension  Comprehension Mode: Auditory  Comprehension: 5-Set-up assist with hearing device  Expression  Expression Mode: Verbal  Expression: 5-Expresses complex 90% of the time/cues < 10% of the time  Social Interaction  Social Interaction: 6-Interacts appropriately with others with medication or extra time (anti-anxiety, antidepressant).  Problem Solving  Problem Solving: 5-Solves basic problems: With no assist  Memory  Memory: 5-Recognizes or recalls 90% of the time/requires cueing < 10% of the time   Hospital Problem List:  2. Anticoagulation/DVT prophylaxis with Pharmaceutical: PAS hose, mobility.   3. Pain Management: monitor on scheduled tylenol. Use biofreeze to back and hips as at home. Ultram prn.    4. Mood: pleasant and motivated. Monitor for now.   5. HTN - elevated BP: monitor with bid checks. Continue metoprolol and increase to a 100 mg/d dose. Avoid ace for now. D/c flomax change to cardura and increase dose to improve systolic control  6. Hyperlipidemia: On zocor daily for treatment.   7. Lumbago due to spinal stenosis/HNP: monitor for symptoms with increase in activity levels. Denies significant pain at present. Likes SCDs-helps with neuropathy symptoms.  8. Chronic constipation: Continue home dose miralax with additional senna for now. Good BM yesterday  9. Monitor for seizure activity moderate risk given his history of putaminal hemorrhage   10. Acute renal failure with hyperkalemia: Improving, last BUN 36.  Hyper kalemia resolved off lisinopril.  Nursing pushing fluids/offering supplements to help with nutrition and hydration.  11. URinary incont improving has skin tear per RN on meatus , D/C condom cath and use neosporin oint

## 2011-12-07 MED ORDER — AMLODIPINE 1 MG/ML ORAL SUSPENSION
2.5000 mg | Freq: Every day | ORAL | Status: DC
  Administered 2011-12-07 – 2011-12-08 (×2): 2.5 mg via ORAL
  Filled 2011-12-07 (×6): qty 2.5

## 2011-12-07 NOTE — Progress Notes (Signed)
Patient ID: Charles Tapia, male   DOB: 03-Dec-1917, 78 y.o.   MRN: [id number] Patient ID: Charles Tapia, male   DOB: Mar 17, 1918, 78 y.o.   MRN: [id number] Patient ID: Charles Tapia, male   DOB: 10/29/1918, 78 y.o.   MRN: [id number] Subjective/Complaints:  No new complaints TRying no briefs today, using adaptive shoe laces Review of Systems  Respiratory: Negative for cough and wheezing.   Cardiovascular: Negative for chest pain.  Back feels better this morning Appetite gradually improving. All other systems reviewed and are negative.     Objective:  Vital Signs:  See vitals section, still with elevated systolic BP   BMET    Component Value Date/Time   NA 131* 12/01/2011 0620   K 4.2 12/01/2011 0620   CL 95* 12/01/2011 0620   CO2 27 12/01/2011 0620   GLUCOSE 102* 12/01/2011 0620   BUN 36* 12/01/2011 0620   CREATININE 1.24 12/01/2011 0620   CALCIUM 9.7 12/01/2011 0620   GFRNONAA 49* 12/01/2011 0620   GFRAA 57* 12/01/2011 0620      Physical Exam: General appearance: alert and appears stated age  Resp: clear to auscultation bilaterally  Cardio: regular rate and rhythm  GI: soft, non-tender; bowel sounds normal; no masses, no organomegaly  Extremities: extremities normal, atraumatic, no cyanosis or edema  Pulses: 2+ and symmetric  Skin: Skin color, texture, turgor normal. No rashes or lesions  Neurologic: Alert. Speech dysarthric but quite intelligible.  Left HP 2/5 in LUE and 3/5 in LLE.  DTR's 1+   Has limitations in R shoulder movement as well due to chronic arthritis.  Assessment/Plan:  1. 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  Physiatrist is providing close team supervision and 24 hour management of active medical problems listed below.  Physiatrist and rehab team continue to assess barriers to discharge/monitor patient progress toward functional and medical goals.  FIM:     FIM - Toileting  Toileting: 1: Total-Patient completed zero steps, helper did all 3  FIM - Therapist, nutritional Devices: Systems developer Transfers: 3-To toilet/BSC: Mod A (lift or lower assist)    Comprehension  Comprehension Mode: Auditory  Comprehension: 5-Set-up assist with hearing device  Expression  Expression Mode: Verbal  Expression: 5-Expresses complex 90% of the time/cues < 10% of the time  Social Interaction  Social Interaction: 6-Interacts appropriately with others with medication or extra time (anti-anxiety, antidepressant).  Problem Solving  Problem Solving: 5-Solves basic problems: With no assist  Memory  Memory: 5-Recognizes or recalls 90% of the time/requires cueing < 10% of the time   Hospital Problem List:  2. Anticoagulation/DVT prophylaxis with Pharmaceutical: PAS hose, mobility.   3. Pain Management: monitor on scheduled tylenol. Use biofreeze to back and hips as at home. Ultram prn.    4. Mood: pleasant and motivated. Monitor for now.   5. HTN - elevated BP: monitor with bid checks. Continue metoprolol and increase to a 100 mg/d dose. Avoid ace for now. D/c flomax change to cardura and increase dose to improve systolic control Added Norvasc 2.5 mg/d today  6. Hyperlipidemia: On zocor daily for treatment.   7. Lumbago due to spinal stenosis/HNP: monitor for symptoms with increase in activity levels. Denies significant pain at present. Likes SCDs-helps with neuropathy symptoms.  8. Chronic constipation: Continue home dose miralax with additional senna for now. Good BM yesterday  9. Monitor for seizure activity  moderate risk given his history of putaminal hemorrhage   10. Acute renal failure with hyperkalemia: Improving, last BUN 36.  Hyper kalemia resolved off lisinopril.  Nursing pushing fluids/offering supplements to help with nutrition and hydration.  11. URinary incont improving has skin tear per RN on meatus , D/C  condom cath and use neosporin oint

## 2011-12-07 NOTE — Progress Notes (Signed)
Physical Therapy Note  Patient Details  Name: Horris Speros MRN: [id number] Date of Birth: [birth year] Today's Date: 12/07/2011  1430-1510 (40 minutes) individual Pain- no complaint of pain Focus of treatment; Gait training with SPC on level, carpet, steps, therapeutic activities to improve tolerance to activity  Treatment: Gait with SPC 150 feet X 2 close supervision with vcs for heel toe gait pattern, up/down 4 steps X 2 with one rail close supervision step to step; Nustep (crosstrainer) Level 4 X 10 minutes Leamon Palau,JIM 12/07/2011, 2:51 PM

## 2011-12-08 NOTE — Progress Notes (Signed)
Physical Therapy Session Note  Patient Details  Name: Charles Tapia MRN: [id number] Date of Birth: 14-Feb-1918  Today's Date: 12/08/2011 Time: 0800-0830 Time Calculation (min): 30 min  Precautions: Precautions Precautions: Fall Precaution Comments: Left inattention expecially of LUE Required Braces or Orthoses: No Restrictions Weight Bearing Restrictions: No  Short Term Goals: see LTG's    Skilled Therapeutic Interventions/Progress Updates:    Therapeutic activity with functional tasks- toileting with S, gait to /from BR with cane, managing doors and lights incorportating LUE when possible to increase motor control; car transfer stand pivot with cane mod I with increased effort to stand from lower seat  Floor transfer for fall recovery- pt able with cues despite OA in R knee and decreased ROM R shoulder from prior injury, able to sustain quadriped for forced use LUE   Discussed need for family ed prior to planned d/c on Thursday  General Chart Reviewed: Yes Family/Caregiver Present: No Vital Signs Therapy Vitals Temp: 98.2 F (36.8 C) Temp src: Oral Pulse Rate: 67  Resp: 18  BP: 142/62 mmHg Patient Position, if appropriate: Lying Oxygen Therapy SpO2: 97 % O2 Device: None (Room air) Pain Pain Assessment Pain Assessment:  (muscle soreness in hips, used muscle rub prior) Pain Score: Asleep Pain Type: Acute pain Pain Location: Knee Pain Orientation: Right Pain Descriptors: Sore Pain Frequency: Intermittent Pain Onset: Gradual Pain Intervention(s): Medication (See eMAR) Mobility Bed Mobility Rolling Left: 7: Independent Supine to Sit: 7: Independent Sitting - Scoot to Edge of Bed: 7: Independent Transfers Sit to Stand: 6: Modified independent (Device/Increase time);With upper extremity assist Stand to Sit: 6: Modified independent (Device/Increase time);With upper extremity assist Locomotion  Ambulation Ambulation/Gait Assistance: 5: Supervision Assistive device:  Straight cane Ambulation/Gait Assistance Details (indicate cue type and reason): holding pen in L hand, decreased cadence and cues to not look at feet High Level Ambulation High Level Ambulation: Other high level ambulation High Level Ambulation - Other Comments: opened doors, managed light switches with L hand     Balance  S with functional task with mild weight shifts       Therapy/Group: Individual Therapy  [name] 12/08/2011, 8:37 AM

## 2011-12-08 NOTE — Progress Notes (Signed)
Patient ID: Charles Tapia, male   DOB: Jun 08, 1918, 78 y.o.   MRN: [id number] Subjective/Complaints:  TRying no briefs today, no incont stools over weekend , using adaptive shoe laces Review of Systems  Respiratory: Negative for cough and wheezing.   Cardiovascular: Negative for chest pain.  Back feels better this morning Appetite gradually improving. All other systems reviewed and are negative.     Objective:  Vital Signs:  See vitals section, still with elevated systolic BP but down to 142/62 and trending positively BMET    Component Value Date/Time   NA 131* 12/01/2011 0620   K 4.2 12/01/2011 0620   CL 95* 12/01/2011 0620   CO2 27 12/01/2011 0620   GLUCOSE 102* 12/01/2011 0620   BUN 36* 12/01/2011 0620   CREATININE 1.24 12/01/2011 0620   CALCIUM 9.7 12/01/2011 0620   GFRNONAA 49* 12/01/2011 0620   GFRAA 57* 12/01/2011 0620      Physical Exam: General appearance: alert and appears stated age  Resp: clear to auscultation bilaterally  Cardio: regular rate and rhythm  GI: soft, non-tender; bowel sounds normal; no masses, no organomegaly  Extremities: extremities normal, atraumatic, no cyanosis or edema  Pulses: 2+ and symmetric  Skin: Skin color, texture, turgor normal. No rashes or lesions  Neurologic: Alert. Speech dysarthric but quite intelligible.  Left HP 2/5 in LUE and 3/5 in LLE.  DTR's 1+   Has limitations in R shoulder movement as well due to chronic arthritis.  Assessment/Plan:  1. 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  Physiatrist is providing close team supervision and 24 hour management of active medical problems listed below.  Physiatrist and rehab team continue to assess barriers to discharge/monitor patient progress toward functional and medical goals.    Hospital Problem List:  2. Anticoagulation/DVT prophylaxis with Pharmaceutical: PAS hose, mobility.    3. Pain Management: monitor on scheduled tylenol. Use biofreeze to back and hips as at home. Ultram prn.  R Knee pain add K pad  4. Mood: pleasant and motivated. Monitor for now.   5. HTN: monitor with bid checks. Continue metoprolol. Avoid ace for now. D/c flomax change to cardura and increase dose to improve systolic control  6. Hyperlipidemia: On zocor daily for treatment.   7. Lumbago due to spinal stenosis/HNP: monitor for symptoms with increase in activity levels. Denies significant pain at present. Likes SCDs-helps with neuropathy symptoms.  8. Chronic constipation: Continue home dose miralax with additional senna for now. Good BM yesterday  9. Monitor for seizure activity moderate risk given his history of putaminal hemorrhage   10. Acute renal failure with hyperkalemia: Improving, last BUN 36.  Hyper kalemia resolved off lisinopril.  Nursing pushing fluids/offering supplements to help with nutrition and hydration.  11. URinary incont improving has skin tear per RN on meatus , D/C condom cath and use neosporin oint

## 2011-12-08 NOTE — Plan of Care (Signed)
Problem: RH SAFETY Goal: RH STG ADHERE TO SAFETY PRECAUTIONS W/ASSISTANCE/DEVICE STG Adhere to Safety Precautions With Assistance/Device. With supervision  Outcome: Progressing Up with cane and staff supervision, occasional min assist

## 2011-12-08 NOTE — Plan of Care (Signed)
Problem: RH BLADDER ELIMINATION Goal: RH STG MANAGE BLADDER WITH ASSISTANCE STG Manage Bladder With Assistance: Min Assist  Outcome: Progressing Patient up to BR with cane during the day, continent.

## 2011-12-08 NOTE — Progress Notes (Signed)
Occupational Therapy Session Note  Patient Details  Name: Charles Tapia MRN: [id number] Date of Birth: 12/06/1917  Today's Date: 12/08/2011 Time: 0830-0925 Time Calculation (min): 55 min  Precautions: Precautions Precautions: Fall Precaution Comments: Left inattention expecially of LUE Required Braces or Orthoses: No Restrictions Weight Bearing Restrictions: No   Skilled Therapeutic Interventions/Progress Updates:    Patient seen this am for OT ADL at shower level to address activity tolerance, functional use of left upper extremity, and functional mobility both within and outside of patient's room.  Patient able to safely transition from sit to stand with supervision, however occasionally needed more than one attempt to stand. Patient able to recognize need for rest breaks.     Pain RN in room at the beginning of OT session.  Asked if muscle rub could be applied to back and right knee after shower.  Chronic pain.  Muscle rub applied after shower, patient reported some relief. ADL See FIM  Therapy/Group: Individual Therapy  [name] 12/08/2011, 9:31 AM

## 2011-12-08 NOTE — Progress Notes (Addendum)
Speech Language Pathology Daily Session Note  Patient Details  Name: Charles Tapia MRN: [id number] Date of Birth: 12/15/1917  Today's Date: 12/08/2011 Time: 1102-1200 Time Calculation (min): 58 min  Short Term Goals: . 1. Patient will utilize speech intelligibility strategies at the conversational speech level with minimal verbal/visual cues  2. Patient will use external memory aids to recall daily information with minimal prompts  3. Patient will utilize hearing/processing strategies by requesting repetition or simplification of information with minimal verbal/visual cues  4. Patient will complete familiar, complex time/medication/money management tasks with supervision verbal cues    Skilled Therapeutic Interventions:  Treatment session focused on speech intelligibility and cognition.    Pt. stated one speech strategy to increase intelligibility without cues.  He Imitated deep diaphragmatic breathing to increase air support and volume in sentences with min cues given.  Pt. stated memory strategy of "write it down" as a way to remember to relay a message to his primary SLP when she returns tomorrow with mild-mod verbal prompts.  Mr. Knack took written notes to recall important information from newspaper article read by SLP with mod-max verbal cues.  Min verbal prompts required for problem solving situation of various scenarios (not hearing or understanding what is spoken to him in regards to medical condition, etc.).   Daily Session Precautions/Restrictions    FIM:  Comprehension Comprehension Mode: Auditory Comprehension: 7-Follows complex conversation/direction: With no assist Expression Expression Mode: Verbal Expression: 6-Expresses complex ideas: With extra time/assistive device Social Interaction Social Interaction: 7-Interacts appropriately with others - No medications needed. Problem Solving Problem Solving: 6-Solves complex problems: With extra time General    Pain: None  stated  Cognition:   Oral/Motor: Oral Motor/Sensory Function Overall Oral Motor/Sensory Function: Impaired Labial ROM: Reduced left Labial Symmetry: Abnormal symmetry left Labial Strength: Reduced Labial Sensation: Within Functional Limits Lingual ROM: Within Functional Limits Lingual Symmetry: Within Functional Limits Lingual Strength: Within Functional Limits Lingual Sensation: Within Functional Limits Facial ROM: Reduced left Facial Symmetry: Left droop Facial Strength: Reduced Facial Sensation: Reduced Velum: Within Functional Limits Mandible: Within Functional Limits Motor Speech Overall Motor Speech: Impaired Respiration: Within functional limits Phonation: Hoarse Resonance: Within functional limits Articulation: Impaired Level of Impairment: Conversation Intelligibility: Intelligibility reduced Word: 75-100% accurate Phrase: 75-100% accurate Sentence: 75-100% accurate Conversation: 50-74% accurate Motor Planning: Witnin functional limits Motor Speech Errors: Not applicable Comprehension: Auditory Comprehension Overall Auditory Comprehension: Impaired Yes/No Questions: Within Functional Limits Commands: Impaired Complex Commands: 50-74% accurate Conversation: Complex Interfering Components: Hearing;Processing speed EffectiveTechniques: Extra processing time;Increased volume;Repetition;Slowed speech;Visual/Gestural cues Visual Recognition/Discrimination Discrimination: Within Function Limits Reading Comprehension Reading Status: Within funtional limits Expression: Expression Primary Mode of Expression: Verbal Verbal Expression Overall Verbal Expression: Appears within functional limits for tasks assessed Written Expression Dominant Hand: Right Written Expression: Within Functional Limits  Therapy/Group: Individual Therapy  Royce Macadamia 12/08/2011, 12:34 PM

## 2011-12-08 NOTE — Progress Notes (Signed)
Social Work Patient ID: Charles Tapia, male   DOB: 18-Oct-1918, 78 y.o.   MRN: [id number] Team feels pt will be ready for discharge sooner. Daughter to come in for family education on Wednesday Once completed pt will be ready for discharge. Daughter, pt and team aware of this will talk with MD. PA Aware also. Will work toward discharge Wednesday after therapies and once family education completed.

## 2011-12-08 NOTE — Progress Notes (Signed)
Physical Therapy Session Note  Patient Details  Name: Charles Tapia MRN: [id number] Date of Birth: [birth year]  Today's Date: 12/08/2011 Time: 1102-1200 Time Calculation (min): 58 min  Precautions: Precautions Precautions: Fall Precaution Comments: Left inattention expecially of LUE Required Braces or Orthoses: No Restrictions Weight Bearing Restrictions: No  Short Term Goals:see LTG's    Skilled Therapeutic Interventions/Progress Updates:   Therapeutic exercise performed with LE and UE to increase ROM and control for functional mobility and balance strategies. Fine motor control decreased with L hand but improving, able to carry objects in L hand now without dropping them in therapy today, significant ROM impairments in R shoulder. Pt reports all mobility easier after stretching and demonstrated HEP stretching  Gait training with changes in direction, head turns, cognitive tasks to challenge balance, using LUE S with cane in R hand, changing cadence but unable to significantly increase speed; pt educated on increased fall risk during dual task conditions   Discussed d/c date with pt, OT and SW and need for family ed   General Chart Reviewed: Yes Family/Caregiver Present: No Pain Pain Assessment Pain Assessment:  (R knee stiffness/sore d/t OA) Pain Intervention(s): Repositioned;Ambulation/increased activity Mobility mod I transfers, using one UE Locomotion  Ambulation Ambulation/Gait Assistance: 5: Supervision Assistive device: Straight cane Ambulation/Gait Assistance Details (indicate cue type and reason): holding pen in L hand, decreased cadence and cues to not look at feet Gait Gait: Yes Gait velocity: 10 m walk test 22 sec with cane, 29 sec with cognitive task High Level Ambulation High Level Ambulation: Head turns;Sudden stops;Direction changes Direction Changes: unable to obtain significnat change in speed High Level Ambulation - Other Comments: opened doors, managed  light switches with L hand Stairs / Additional Locomotion Stairs: Yes Stairs Assistance: 5: Supervision Stairs Assistance Details (indicate cue type and reason): holding cane in either R or L, reciprocal and step to Stair Management Technique: One rail Right;With cane Number of Stairs: 15  Ramp: 5: Supervision Curb: 5: Supervision (with cane S)     Exercises Total Joint Exercises Long Arc Quad: AROM;10 reps;Limitations;Other (comment) (R knee lacks full extension d/t RA) Other Exercises Other Exercises: LUE and RUE therex with resistance band for RUE ROM and LUE control/grip Other Exercises: self stretching to LE with education for HEP Other Exercises: paasive stretch to pelvic and thoracic spine to increase extension    Therapy/Group: Individual Therapy  [name] 12/08/2011, 12:02 PM

## 2011-12-08 NOTE — Progress Notes (Signed)
Per State Regulation 482.30 This chart was reviewed for medical necessity with respect to the patient's Admission/Duration of stay. Pt continues to participate and progress toward goals. BP meds adjusted. Continent of b&b.  Meryl Dare                 Nurse Care Manager            Next Review Date: 12/11/11

## 2011-12-09 MED ORDER — AMLODIPINE 1 MG/ML ORAL SUSPENSION
5.0000 mg | Freq: Every day | ORAL | Status: DC
  Administered 2011-12-09 – 2011-12-10 (×2): 5 mg via ORAL
  Filled 2011-12-09 (×5): qty 5

## 2011-12-09 NOTE — Progress Notes (Signed)
Physical Therapy Session Note  Patient Details  Name: Charles Tapia MRN: [id number] Date of Birth: 07-Jan-1918  Today's Date: 12/09/2011 Time: 9629-5284 Time Calculation (min): 57 min  Precautions: Precautions Precautions: Fall Precaution Comments: Left inattention expecially of LUE Required Braces or Orthoses: No Restrictions Weight Bearing Restrictions: No  Short Term Goals:= LTG's    Skilled Therapeutic Interventions/Progress Updates:    Pt education on [place] exercise program for fall prevention- pt performed it with 3 # weights; home safety education; redo BERG, improved from 39 last week to 42 this week (80% fall risk ); pt made mod I in room after discussion with OT for mobility; pt will be ready for d/c tomorrow after family education on fall prevention and HEP is completed with daughter  Pain no c/o pain Pain Assessment Pain Score: 0-No pain Mobility mod I with cane, increased OA stiffness in the morning, needs to use UE's to stand   Locomotion  Ambulation Ambulation/Gait Assistance: 6: Modified independent (Device/Increase time) Ambulation Distance (Feet): 150 Feet Ambulation/Gait Assistance Details (indicate cue type and reason): decreased cadence, tends to look down at feet Gait Gait Pattern: Step-through pattern Stairs / Additional Locomotion Stairs Assistance: 6: Modified independent (Device/Increase time) Stair Management Technique: With cane;Step to pattern;One rail Left Number of Stairs: 12       Balance Standardized Balance Assessment Standardized Balance Assessment: Berg Balance Test Berg Balance Test Sit to Stand: Able to stand  independently using hands Standing Unsupported: Able to stand safely 2 minutes Sitting with Back Unsupported but Feet Supported on Floor or Stool: Able to sit safely and securely 2 minutes Stand to Sit: Controls descent by using hands Transfers: Able to transfer safely, definite need of hands Standing Unsupported with Eyes Closed:  Able to stand 10 seconds safely Standing Ubsupported with Feet Together: Able to place feet together independently and stand 1 minute safely From Standing, Reach Forward with Outstretched Arm: Can reach forward >5 cm safely (2") From Standing Position, Pick up Object from Floor: Able to pick up shoe safely and easily From Standing Position, Turn to Look Behind Over each Shoulder: Looks behind one side only/other side shows less weight shift Turn 360 Degrees: Able to turn 360 degrees safely but slowly Standing Unsupported, Alternately Place Feet on Step/Stool: Able to complete 4 steps without aid or supervision Standing Unsupported, One Foot in Front: Able to plae foot ahead of the other independently and hold 30 seconds Standing on One Leg: Tries to lift leg/unable to hold 3 seconds but remains standing independently Total Score: 42    Exercises General Exercises - Lower Extremity Long Arc Quad: AROM;Strengthening;Both;10 reps;Weights Hip ABduction/ADduction: 10 reps;Strengthening;AROM;Standing Toe Raises: 10 reps;AROM Heel Raises: 10 reps;AROM Mini-Sqauts: AROM;Strengthening;10 reps;Standing Standing Knee Flexion: 10 reps;Weights;Strengthening;Both Repetitive Sit to Stands: 8 reps;Two upper extremities Other Exercises Other Exercises: heel -toe standing  difficulty raising toes, especially on R, more difficulty with standing therex with LLE    Therapy/Group: Individual Therapy  [name] 12/09/2011, 8:57 AM

## 2011-12-09 NOTE — Progress Notes (Signed)
Speech Language Pathology Daily Session Note  Patient Details  Name: Charles Tapia MRN: [id number] Date of Birth: 11-11-1917  Today's Date: 12/09/2011 Time: 1000-1040 Time Calculation (min): 40 min  Short Term Goals: 11/28/11  1. Patient will utilize speech intelligibility strategies at the conversational speech level with minimal verbal/visual cues  2. Patient will use external memory aids to recall daily information with minimal prompts  3. Patient will utilize hearing/processing strategies by requesting repetition or simplification of information with minimal verbal/visual cues  4. Patient will complete familiar, complex time/medication/money management tasks with supervision verbal cues  Skilled Therapeutic Interventions: Patient utilized note to recall information from yesterday's session with modified independence; but required supervision cues to recall change in medication with no attempts to use medication chart for assistance.  However, after set up patient was modified independent with use of chart to recall all further medications.  Patient fully intelligible in a quiet hallway and requested repeat from SLP x1.  Patient reports family education is scheduled with his daughter tomorrow.   Daily Session Precautions/Restrictions    FIM:  Comprehension Comprehension Mode: Auditory Comprehension: 6-Follows complex conversation/direction: With extra time/assistive device Expression Expression Mode: Verbal Expression: 6-Expresses complex ideas: With extra time/assistive device Social Interaction Social Interaction: 7-Interacts appropriately with others - No medications needed. Problem Solving Problem Solving: 7-Solves complex problems: Recognizes & self-corrects Memory Memory: 5-Requires cues to use assistive device General    Pain Pain Assessment Pain Assessment: No/denies pain Pain Score: 0-No pain  Therapy/Group: Individual Therapy  Charlane Ferretti.,  CCC-SLP [phone]  Edvardo Honse 12/09/2011, 12:09 PM

## 2011-12-09 NOTE — Progress Notes (Signed)
Patient ID: Charles Tapia, male   DOB: 16-Jun-1918, 78 y.o.   MRN: [id number] Subjective/Complaints:  R knee pain,"I have arthritis" Review of Systems  Respiratory: Negative for cough and wheezing.   Cardiovascular: Negative for chest pain.   Appetite gradually improving. All other systems reviewed and are negative.     Objective:  Vital Signs:  See vitals section, still with elevated systolic BP but down to 142/62 and trending positively BMET    Component Value Date/Time   NA 131* 12/01/2011 0620   K 4.2 12/01/2011 0620   CL 95* 12/01/2011 0620   CO2 27 12/01/2011 0620   GLUCOSE 102* 12/01/2011 0620   BUN 36* 12/01/2011 0620   CREATININE 1.24 12/01/2011 0620   CALCIUM 9.7 12/01/2011 0620   GFRNONAA 49* 12/01/2011 0620   GFRAA 57* 12/01/2011 0620      Physical Exam: General appearance: alert and appears stated age  Resp: clear to auscultation bilaterally  Cardio: regular rate and rhythm  GI: soft, non-tender; bowel sounds normal; no masses, no organomegaly  Extremities: extremities normal, atraumatic, no cyanosis or edema  Pulses: 2+ and symmetric  Skin: Skin color, texture, turgor normal. No rashes or lesions  Neurologic: Alert. Speech dysarthric but quite intelligible.  Left HP 3/5 in LUE and 3/5 in LLE.  DTR's 1+   Has limitations in R shoulder movement as well due to chronic arthritis.  Assessment/Plan:  1. 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  Physiatrist is providing close team supervision and 24 hour management of active medical problems listed below.  Physiatrist and rehab team continue to assess barriers to discharge/monitor patient progress toward functional and medical goals.    Hospital Problem List:  2. Anticoagulation/DVT prophylaxis with Pharmaceutical: PAS hose, mobility.   3. Pain Management: monitor on scheduled tylenol. Use biofreeze to back and  hips as at home. Ultram prn.  R Knee pain add K pad  4. Mood: pleasant and motivated. Monitor for now.   5. HTN: monitor with bid checks. Continue metoprolol. Avoid ace for now. D/c flomax change to cardura and increase dose to improve systolic control  6. Hyperlipidemia: On zocor daily for treatment.   7. Lumbago due to spinal stenosis/HNP: monitor for symptoms with increase in activity levels. Denies significant pain at present. Likes SCDs-helps with neuropathy symptoms.  8. Chronic constipation: Continue home dose miralax with additional senna for now. Good BM yesterday  9. Monitor for seizure activity moderate risk given his history of putaminal hemorrhage   10. Acute renal failure with hyperkalemia: Improving, last BUN 36.  Hyper kalemia resolved off lisinopril.  Nursing pushing fluids/offering supplements to help with nutrition and hydration.  11. URinary incont improving has skin tear per RN on meatus , D/C condom cath and use neosporin oint

## 2011-12-09 NOTE — Progress Notes (Signed)
Patient ID: Charles Tapia, male   DOB: 1918-06-18, 78 y.o.   MRN: [id number] Clinical update faxed to [facility].

## 2011-12-09 NOTE — Progress Notes (Signed)
Physical Therapy Session Note  Patient Details  Name: Charles Tapia MRN: [id number] Date of Birth: October 11, 1918  Today's Date: 12/09/2011 Time: 1340-1405 Time Calculation (min): 25 min  Precautions: Precautions Precautions: Fall Precaution Comments: Left inattention expecially of LUE Required Braces or Orthoses: No Restrictions Weight Bearing Restrictions: No  Short Term Goals:   Skilled Therapeutic Interventions/Progress Updates: Treatment focused on gait training in community setting and gift shop, as well as outdoors on uneven ground.  Gait on concrete using SPC    with close supervision, x 30/, x 60', with one LOB/2 trials, as he stepped near a drainage grate.  Concrete sloped near grate.  Pt required min a to recover. Stand> outdoor bench transfer with supervision.  Ascended/descended 5 brick steps with one rail, using cane, alternating his feet, with supervision.  Gait with SPC in congested gift shop, visually scanning for [place] cards with VCs, and extra time due to L inattention.   PT discussed safety strategies for walking on uneven ground; pt did not seem to see the slope of the concrete in order to avoid it.      Pain Pain Assessment Pain Assessment: No/denies pain Pain Score: 0-No pain  Therapy/Group: Individual Therapy  [name] 12/09/2011, 3:03 PM

## 2011-12-09 NOTE — Progress Notes (Signed)
Occupational Therapy Session Note  Patient Details  Name: Charles Tapia MRN: [id number] Date of Birth: 11/11/1917  Today's Date: 12/09/2011 Time: 1100-1155 Time Calculation (min): 55 min  Precautions: Precautions Precautions: Fall Precaution Comments: Left inattention expecially of LUE Required Braces or Orthoses: No Restrictions Weight Bearing Restrictions: No   Skilled Therapeutic Interventions/Progress Updates:    ADL retraining including bathing at walk-in shower level and dressing sit to stand from EOB.  Pt completed all tasks at supervision level with additional time to complete.  Pt using LUE as nondominant UE at diminished level.  Pt attending to Lt side of body throughout session.  Pt ambulated to BR with SPC for shower transfer and toilet transfer/toileting.  See FIM for details.  No unsafe behavior noted.     Pain Pain Assessment Pain Assessment: No/denies pain   Therapy/Group: Individual Therapy  Rich Brave 12/09/2011, 12:06 PM

## 2011-12-09 NOTE — Progress Notes (Signed)
Patient Details  Name: Gilmore List MRN: [id number] Date of Birth: December 04, 1917  Today's Date: 12/09/2011 Time: 1330-1405  Skilled Therapeutic Interventions/Progress Updates: Ambulated with [facility] on unit with Supervision ~300 feet.  Ambulated on even/uneven outdoor surfaces  using SPC with supervision except for LOB one time ambulating near drainage grate needing Min assist to recover.  Pt also ambulated in gift shop using [facility] with supervision scanning to left to locate Valentine's card with Min cues.  C/o Right hip/leg pain, relieved with rest.   Therapy/Group: Community Reintegration  Activity Level: Moderate:  Level of assist: Min Assist due to LOB on outdoor surface  Akacia Boltz 12/09/2011, 3:33 PM

## 2011-12-10 MED ORDER — ACETAMINOPHEN 500 MG PO TABS: 500.0000 mg | ORAL_TABLET | Freq: Three times a day (TID) | ORAL | Status: AC

## 2011-12-10 MED ORDER — AMLODIPINE BESYLATE 5 MG PO TABS: 5.0000 mg | ORAL_TABLET | Freq: Every day | ORAL | Status: DC

## 2011-12-10 MED ORDER — DOXAZOSIN MESYLATE 2 MG PO TABS: 2.0000 mg | ORAL_TABLET | Freq: Every day | ORAL | Status: DC

## 2011-12-10 NOTE — Progress Notes (Signed)
Pt and daughter received written and verbal discharge instructions from Marissa Nestle, [place]. Concerns and questions addressed, Aware of follow up appointments and where to look for home use SCD per handout by PA. Personal belongings packed by family, pt taken down to private vehicle via wheelchair by NT. [place], Katrinka Herbison Elon Jester

## 2011-12-10 NOTE — Progress Notes (Signed)
Occupational Therapy Discharge Summary  Patient Details  Name: Charles Tapia MRN: [id number] Date of Birth: [birth year] Today's Date: 12/10/2011  Patient has met 10 of 10 long term goals due to improved activity tolerance, improved balance, postural control, ability to compensate for deficits, functional use of  LEFT upper and LEFT lower extremity and improved coordination.  Patient's care partner is independent to provide the necessary cognitive assistance at discharge.    Recommendation:  Patient will continue to received OT services in a home health setting to continue to advance functional skills in the area of iADL.  Equipment: No equipment provided  Patient/family agrees with progress made and goals achieved: Yes  [name] 12/10/2011, 4:47 PM

## 2011-12-10 NOTE — Progress Notes (Signed)
Speech Language Pathology Daily Session Note & Discharge Summary   Patient Details  Name: Charles Tapia MRN: [id number] Date of Birth: 04/02/1918  Today's Date: 12/10/2011 Time: 4098-1191 Time Calculation (min): 40 min  Skilled Therapeutic Interventions: Session focused on cognitive compensation with planning for discharge to go home and family education with daughter.  Patient and daughter educated regarding patient's independence with financial and medication management tasks.  Patient with hoarse vocal quality and intermittent cough suspected to be none stroke related at this however recommended that if it persists to follow up with MD.  Daily Session Precautions/Restrictions    FIM:  Comprehension Comprehension Mode: Auditory Comprehension: 6-Follows complex conversation/direction: With extra time/assistive device Expression Expression Mode: Verbal Expression: 6-Expresses complex ideas: With extra time/assistive device Social Interaction Social Interaction: 7-Interacts appropriately with others - No medications needed. Problem Solving Problem Solving: 7-Solves complex problems: Recognizes & self-corrects Memory Memory: 6-More than reasonable amt of time FIM - Eating Eating Activity: 5: Set-up assist for open containers General    Pain Pain Assessment Pain Assessment: No/denies pain Pain Score: 0-No pain Cognition: Overall Cognitive Status: Appears within functional limits for tasks assessed Arousal/Alertness: Awake/alert Orientation Level: Oriented X4 Safety/Judgment: Appears intact Oral/Motor: Oral Motor/Sensory Function Overall Oral Motor/Sensory Function: Appears within functional limits for tasks assessed Motor Speech Overall Motor Speech: Appears within functional limits for tasks assessed (mod I for low volume at times) Respiration: Within functional limits Phonation: Hoarse Motor Planning: Witnin functional limits Motor Speech Errors: Not  applicable Comprehension: Auditory Comprehension Overall Auditory Comprehension: Appears within functional limits for tasks assessed (with extra times and repeats as needed) Yes/No Questions: Within Functional Limits Interfering Components: Hearing;Processing speed EffectiveTechniques: Extra processing time;Increased volume;Repetition;Slowed speech;Visual/Gestural cues Visual Recognition/Discrimination Discrimination: Within Function Limits Reading Comprehension Reading Status: Within funtional limits Expression: Expression Primary Mode of Expression: Verbal Verbal Expression Overall Verbal Expression: Appears within functional limits for tasks assessed Written Expression Dominant Hand: Right Written Expression: Within Functional Limits  Therapy/Group: Individual Therapy    Discharge Report  Date of Last Treatment Session: Past Medical History  Diagnosis Date  . Hypertension   . Cardiomyopathy   . CAD (coronary artery disease)   . Gout   . Hyperlipidemia   . Mitral regurgitation     Status at initiation of therapy: Minimal assist  Treatment Area(s): Communication and Cognition  Met Goals: 4 out of 4 Total Goals  Participation in Treatment (at discharge): Cooperative  Functional Status at time of Discharge:  Cognition: Patient demonstrates no cognitive deficits.  Communication: Patient demonstrates minimal communication deficits.  Language: Patient demonstrates no language deficits.  Motor Speech: Patient demonstrates no motor speech deficits.  Swallow: Patient demonstrates no dysphagia.                   Clinical Bedside assessment was completed on admission                      Instrumental assessment was not completed                              Swallowing Guidelines: Patient tolerating  regular diet and thin liquids independently            no help required  Patient is discharged to Home with no further follow up needed at this time  Therapist:  Cray Monnin Date: 12/10/2011  Fae Pippin, M.A., CCC-SLP [phone]

## 2011-12-10 NOTE — Progress Notes (Signed)
Social Work Discharge Note Discharge Note  The overall goal for the admission was met for:   Discharge location: Yes-HOME WITH WIFE AND HIRED 24 HOUR CAREGIVER FOR BOTH  Length of Stay: Yes-13 DAYS Discharge activity level: Yes-SUPERVISION LEVEL Home/community participation: Yes  Services provided included: MD, RD, PT, OT, SLP, RN, CM, TR, Pharmacy and SW  Financial Services: Private Insurance: BLUE MEDICARE  Follow-up services arranged: Home Health: [facility]-PT,OT,RN, DME: [facility] and Patient/Family has no preference for HH/DME agencies  Comments (or additional information): FAMILY EDUCATION COMPLETED TODAY AND WENT WELL. VERY SUPPORTIVE DAUGHTER  Patient/Family verbalized understanding of follow-up arrangements: Yes  Individual responsible for coordination of the follow-up plan: ANNE-DAUGHTER  Confirmed correct DME delivered: Lucy Chris 12/10/2011    Lucy Chris

## 2011-12-10 NOTE — Progress Notes (Signed)
Patient ID: Charles Tapia, male   DOB: Feb 19, 1918, 78 y.o.   MRN: [id number] Subjective/Complaints:  Reports back pain 40% better overall.  Review of Systems  Respiratory: Negative for cough and wheezing.   Cardiovascular: Negative for chest pain.   Appetite gradually improving. All other systems reviewed and are negative.     Objective:  Vital Signs:  See vitals section, still with elevated systolic BP but down to 142/62 and trending positively BMET    Component Value Date/Time   NA 131* 12/01/2011 0620   K 4.2 12/01/2011 0620   CL 95* 12/01/2011 0620   CO2 27 12/01/2011 0620   GLUCOSE 102* 12/01/2011 0620   BUN 36* 12/01/2011 0620   CREATININE 1.24 12/01/2011 0620   CALCIUM 9.7 12/01/2011 0620   GFRNONAA 49* 12/01/2011 0620   GFRAA 57* 12/01/2011 0620      Physical Exam: General appearance: alert and appears stated age  Resp: clear to auscultation bilaterally  Cardio: regular rate and rhythm  GI: soft, non-tender; bowel sounds normal; no masses, no organomegaly  Extremities: extremities normal, atraumatic, no cyanosis or edema  Pulses: 2+ and symmetric  Skin: Skin color, texture, turgor normal. No rashes or lesions  Neurologic: Alert. Speech dysarthric but quite intelligible.  Left HP 3/5 in LUE and 3/5 in LLE.  DTR's 1+   Has limitations in R shoulder movement as well due to chronic arthritis.  Assessment/Plan:  1. Functional deficits secondary to right putamen ICH stable for D/C today   Hospital Problem List:  2. Anticoagulation/DVT prophylaxis with Pharmaceutical: PAS hose, mobility.   3. Pain Management: monitor on scheduled tylenol. Use biofreeze to back and hips as at home. Ultram prn.  R Knee pain add K pad  4. Mood: pleasant and motivated.  Glad to be going home.  5. HTN: monitor with bid checks. Continue metoprolol. . D/c flomax change to cardura and increase dose to improve systolic control. Increased  norvasc.  6. Hyperlipidemia: On zocor daily for treatment.    7. Lumbago due to spinal stenosis/HNP: 8. Chronic constipation: Continue home dose miralax with additional senna for now.   9. Monitor for seizure activity moderate risk given his history of putaminal hemorrhage.  Has been seizure free during the stay.

## 2011-12-10 NOTE — Progress Notes (Signed)
Occupational Therapy Note  Patient Details  Name: Kole Hilyard MRN: [id number] Date of Birth: 07/20/1918 Today's Date: 12/10/2011  Time: 1000-1030 Pt denies pain Individual therapy  Pt sitting in chair conversing with dtr Dewayne Hatch.  Pt stated he had already bathed at sink and dressed prior to therapy.  Dtr Dewayne Hatch present for [facility].  Practiced shower transfer, toilet transfer, bed transfer, and functional amb with SPC for home mgmt tasks. Discussed recommendation for 24 hours supervision. Dtr stated that 24 hour supervision/assistance is in place when pt discharges home. Dtr stated she had no further questions and RN notified that therapy was complete.   Lavone Neri [facility] 12/10/2011, 10:48 AM

## 2011-12-10 NOTE — Progress Notes (Signed)
Physical Therapy Discharge Summary  Patient Details  Name: Charles Tapia MRN: [id number] Date of Birth: January 10, 1918 Today's Date: 12/10/2011  Patient has met 7 of 7 long term goals due to improved activity tolerance, improved balance, improved postural control, increased strength, ability to compensate for deficits and improved coordination.  Patient to discharge at an ambulatory level Modified Independent.   Patient's care partner is independent to provide the necessary physical assistance at discharge.  Recommendation:  Patient will benefit from ongoing skilled PT services in home health setting to continue to advance safe functional mobility, address ongoing impairments in balance and minimize fall risk.  Equipment: Equipment provided: cane  Patient/family agrees with progress made and goals achieved: Yes  [name] 12/10/2011, 12:45 PM

## 2011-12-11 NOTE — Discharge Summary (Signed)
Charles Tapia                 ACCOUNT NO.:  [id number]  MEDICAL RECORD NO.:  [id number]  LOCATION:  4145                         FACILITY:  [facility]  PHYSICIAN:  Erick Colace, M.D.DATE OF BIRTH:  [birth year]  DATE OF ADMISSION:  11/27/2011 DATE OF DISCHARGE:  12/10/2011                              DISCHARGE SUMMARY   DISCHARGE DIAGNOSES: 1. Right putaminal hemorrhage. 2. Hypertension. 3. Dyslipidemia. 4. Lumbago. 5. Acute renal failure, resolved.  HISTORY OF PRESENT ILLNESS:  [name] is a 78 year old male with history of hypertension and coronary artery disease, admitted on November 22, 2011, with problems controlling left side and slurred speech.  CT of head done showed right periventricular hemorrhage.  Blood pressures were elevated requiring Cardene drip.  MRI of brain done showed right putaminal intracerebral hemorrhage with mild surrounding edema.  Carotid Dopplers done showed no ICA stenosis.  Two-D echo done showed EF of 50- 55% with hypokinesis of inferoseptal myocardium and moderately calcified aortic valve.  Currently, the patient continues with left-sided weakness with labile blood pressures.  Therapy evaluation done reveals the patient with balance deficits and posterior lean.  He requires cues for midline in upright posture.  The patient was evaluated by Rehab and we felt that he would benefit from inpatient rehab program.  REVIEW OF SYSTEMS:  Positive for weight loss.  Negative for shortness of breath or chest pain.  Negative for heartburn or abdominal pain, but positive for constipation, positive for urgency.  Positive for back pain and joint pain due to arthritis.  Positive for focal weakness.  No anxiety or depression noted.  Complains of left greater than right hip pain when in bed.  PAST MEDICAL HISTORY:  Hypertension, cardiomyopathy, coronary artery disease, gout, hyperlipidemia, chronic low back pain, pain with radiculopathy in right lower  extremity, and mitral regurg.  PAST SURGICAL HISTORY:  CABG, appendectomy, and back surgery.  FAMILY HISTORY:  Positive for son having diabetes.  SOCIAL HISTORY:  The patient is married and provides some care for his disabled wife.  Has an aide for assistance for her during the day and son helps him put her to bed.  He reports that he does not smoke, does not use smokeless tobacco, alcohol or illicit drugs.  Family plans on hiring 24-hour assistance past discharge.  ALLERGIES:  PENICILLIN. ACE-hyperkalemia, renal insufficiency.  PHYSICAL EXAMINATION:  GENERAL:  The patient is alert and oriented x3. Well nourished, well developed, without acute distress. HEENT:  Atraumatic, normocephalic.  Oral mucosa is pink and moist with fair dentition.  Pupils are equal, round, reactive to light. NECK:  Supple with normal range of motion. CARDIOVASCULAR:  Regular rhythm.  Systolic murmur present, 1/6. PULMONARY:  Normal effort.  Lungs are clear bilaterally. ABDOMEN:  Soft.  Positive bowel sounds. NEUROLOGIC:  The patient is alert and oriented x3.  He is noted to have mild left facial asymmetry.  Strength in right upper extremity is 5/5 at deltoids, biceps, triceps, and grip.  Strength in right lower extremity is 4/5 at hip flexors, knee flexors, knee extension, dorsiflexors, plantar flexors.  Left upper extremity strength is up to 4/5 left deltoid and triceps, 3/5 left biceps, grips,  finger flexors, 2/5 left hip flexors, 3/5 left knee extensors and ankle dorsiflexors.  Sensation intact to light touch.  No evidence of field cut. PSYCHIATRIC:  Memory, mood, affect are normal.  HOSPITAL COURSE:  [name] was admitted to Rehab on November 27, 2011, for inpatient therapies to consist of PT, OT, and speech therapy at least 3 hours 5 days a week.  Past admission, physiatrist, rehab RN, and therapy team have worked together to provide customized collaborative interdisciplinary care.  The  patient's blood pressures have been monitored on a b.i.d. basis.  Initially, the patient was on lisinopril and metoprolol as at home.  Labs done at time of admission showed the patient to have acute renal failure with hyperkalemia with potassium at 5.4, BUN 53, creatinine at 1.53.  The patient was treated with Kayexalate as well as IV fluids to help with his hydration.  His lisinopril was discontinued.  The patient was started on Norvasc and his Flomax was changed to Cardura to help better manage his blood pressure. Last check of lytes from December 01, 2011, revealed sodium 131, potassium 4.2, chloride 95, CO2 of 27, BUN 36, creatinine 1.24, glucose 102.  The patient and family have been advised about pushing p.o. fluids for now and to have lytes rechecked at MD's office in the next 1-2 weeks.  The patient's Norvasc was further titrated to 5 mg 24 hours prior to discharge.  He will follow up with MD for recheck of blood pressure and for further adjustments of meds as needed.  CBC done at admission revealed hemoglobin 11.3, hematocrit 33.3, white count 8.1, platelets 194.  The patient's p.o. intake has improved.  Rehab RN has been assisting with providing nutritional supplements to help the patient keep up his nutritional status and also his hydration status.  During the patient's stay in Rehab, weekly team conferences were held to monitor the patient's progress, set goals as well as discuss barriers to discharge.  At admission, the patient was noted to require min assist for mobility due to left-sided weakness as well as impaired timing and sequencing.  He was also noted to have antalgic gait pattern due to pain in bilateral hips and low back pain.  OT evaluation revealed the patient requiring max to mod assist for basic self-care tasks.  Speech Therapy has worked with the patient on speech intelligibility as well as utilization of memory aids to recall daily information and to  help complete familiar and complex tasks.  By the time of discharge, the patient was modified independent for transfers, modified independent for ambulating short distances.  He requires close supervision for gait on uneven surface with loss of balance and min assist to recover.  He does require extra time due to left inattention.  The patient is currently at modified independent with intermittent supervision required to complete ADL tasks.  He is using left upper extremity as nondominant upper extremity at diminished levels.  His cognition has improved with improved ability to follow complex conversation, express complex ideas, and solve complex problems.  He is able to recognize errors and self correct independently.  He requires increased time for memory.  Further followup with PT/OT has been set up via [facility].  24-hour supervision is recommended for now.  On December 10, 2011, the patient is discharged to home.  DISCHARGE MEDICATIONS: 1. Tylenol 500 mg p.o. t.i.d. 2. Norvasc 5 mg per day. 3. Cardura 2 mg per day. 4. Allopurinol 300 mg a day. 5.  Os-Cal 1 p.o. per day. 6. Fish oil 1 p.o. per day. 7. Metoprolol XL 50 mg p.o. per day. 8. MiraLAX 17 g per day. 9. Zocor 20 mg p.o. per day.  DIET:  Regular.  ACTIVITY LEVEL:  24-hour supervision with use of cane.  SPECIAL INSTRUCTIONS:  [facility] to provide PT, OT, and RN.  Do not use lisinopril, Flomax, or Aleve.  FOLLOWUP:  The patient to follow up with Dr. Wynn Banker on December 31, 2010, at 11:15 for 11:45 appointment.  Follow up with Dr. Burnett Sheng for posthospital check in 2 weeks with check of lytes as well as blood pressure.  Follow up with Dr. Delia Heady in 6 weeks.  Follow up with Dr. Freda Munro for routine check.     Delle Reining, P.A.   ______________________________ Erick Colace, M.D.    PL/MEDQ  D:  12/10/2011  T:  12/11/2011  Job:  [id number]  cc:   Pramod P. Pearlean Brownie, MD Freda Munro,  MD Jerl Mina, MD

## 2012-01-01 ENCOUNTER — Encounter: Payer: Self-pay | Admitting: Physical Medicine & Rehabilitation

## 2012-01-01 ENCOUNTER — Encounter: Payer: Medicare Other | Attending: Physical Medicine & Rehabilitation

## 2012-01-01 ENCOUNTER — Ambulatory Visit (HOSPITAL_BASED_OUTPATIENT_CLINIC_OR_DEPARTMENT_OTHER): Payer: Medicare Other | Admitting: Physical Medicine & Rehabilitation

## 2012-01-01 ENCOUNTER — Ambulatory Visit (HOSPITAL_COMMUNITY)
Admission: RE | Admit: 2012-01-01 | Discharge: 2012-01-01 | Disposition: A | Discharge status: Home / Self Care | Payer: Medicare Other | Source: Ambulatory Visit | Attending: Physical Medicine & Rehabilitation | Admitting: Physical Medicine & Rehabilitation

## 2012-01-01 DIAGNOSIS — M25561 Pain in right knee: Secondary | ICD-10-CM

## 2012-01-01 DIAGNOSIS — M79609 Pain in unspecified limb: Secondary | ICD-10-CM | POA: Insufficient documentation

## 2012-01-01 DIAGNOSIS — M25569 Pain in unspecified knee: Secondary | ICD-10-CM

## 2012-01-01 DIAGNOSIS — R209 Unspecified disturbances of skin sensation: Secondary | ICD-10-CM | POA: Insufficient documentation

## 2012-01-01 DIAGNOSIS — IMO0002 Reserved for concepts with insufficient information to code with codable children: Secondary | ICD-10-CM

## 2012-01-01 DIAGNOSIS — I619 Nontraumatic intracerebral hemorrhage, unspecified: Secondary | ICD-10-CM | POA: Insufficient documentation

## 2012-01-01 DIAGNOSIS — M48061 Spinal stenosis, lumbar region without neurogenic claudication: Secondary | ICD-10-CM

## 2012-01-01 DIAGNOSIS — R29898 Other symptoms and signs involving the musculoskeletal system: Secondary | ICD-10-CM | POA: Insufficient documentation

## 2012-01-01 DIAGNOSIS — R4789 Other speech disturbances: Secondary | ICD-10-CM | POA: Insufficient documentation

## 2012-01-01 NOTE — Progress Notes (Signed)
  Subjective:    Patient ID: Charles Tapia, male    DOB: [birth year], 78 y.o.   MRN: [id number]  HPI 78 year old male with a history of hypertension and coronary artery disease was admitted January 19 20 13th with left-sided weakness and slurred speech. CT of the head showed a right putaminal hemorrhage. 2-D echo showed no source of embolism. He was noted to inpatient rehabilitation January 24 20 13th and was discharged on 12/10/2011. Patient was discharged to home with family assistance and has had home health PT and OT. Pain Inventory Average Pain 7 Pain Right Now 7 My pain is constant, sharp and stabbing  In the last 24 hours, has pain interfered with the following? General activity 7 Relation with others 5 Enjoyment of life 5 What TIME of day is your pain at its worst? "hurts when I get up in the morning and worsens throughout day" Sleep (in general) Fair  Pain is worse with: walking and standing Pain improves with: rest and heat/ice Relief from Meds: 7  Mobility walk without assistance use a cane use a walker how many minutes can you walk? 10 ability to climb steps?  yes do you drive?  no transfers alone Do you have any goals in this area?  yes  Function what is your job? 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  no  Physicians involved in your care Any changes since last visit?  no  Teacher, early years/pre, Dr Jerl Mina PCP Dr Park Breed cardiologist      Review of Systems  HENT: Negative.   Eyes: Negative.   Respiratory: Negative.   Cardiovascular: Negative.   Gastrointestinal: Negative.   Genitourinary: Negative.   Musculoskeletal: Positive for back pain, joint swelling and arthralgias.  Neurological: Positive for weakness and numbness.  Hematological: Negative.   Psychiatric/Behavioral: Negative.        Objective:   Physical Exam  Motor strength is 4/5 in the  left deltoid, biceps, triceps, grip as well as hip flexion, knee extension and ankle dorsiflexion. On the right side it is 3 minus at the deltoid, 4 at the biceps and triceps. 3 minus at the right ankle dorsiflexor. 4 at the knee extensor. Knee range of motion is full there is minimal crepitus. There is tenderness to palpation along the medial joint line. There is decreased sensation in the right L3 and L4 dermatomal distribution. Deep tendon reflexes are reduced at the right knee and right ankle. Normal at the left knee and left ankle. Gait kyphotic. Lumbar range of motion reduce with extension. Flexion is only mildly reduced. Mood and affect are appropriate      Assessment & Plan:  1. Right putaminal hemorrhage has been recovering well on this. 2. Right lower extremity pain. He has tenderness along the medial joint line of the knee as well as decreased reflexes and ankle dorsiflexor weakness. I believe he may have a combination of knee osteoarthritis and lumbar spinal stenosis with radiculopathy. We'll first get x-rays of the knee and inject the knee and see how much improvement we get. The patient states that the right foot drop has been chronic since his prior back surgery in 2007.  Over half the 25 minute visit was spent counseling and coordination of care.

## 2012-01-01 NOTE — Patient Instructions (Signed)
Wear and Tear Disorders of the Knee (Arthritis, Osteoarthritis) Everyone will experience wear and tear injuries (arthritis, osteoarthritis) of the knee. These are the changes we all get as we age. They come from the joint stress of daily living. The amount of cartilage damage in your knee and your symptoms determine if you need surgery. Mild problems require approximately two months recovery time. More severe problems take several months to recover. With mild problems, your surgeon may find worn and rough cartilage surfaces. With severe changes, your surgeon may find cartilage that has completely worn away and exposed the bone. Loose bodies of bone and cartilage, bone spurs (excess bone growth), and injuries to the menisci (cushions between the large bones of your leg) are also common. All of these problems can cause pain. For a mild wear and tear problem, rough cartilage may simply need to be shaved and smoothed. For more severe problems with areas of exposed bone, your surgeon may use an instrument for roughing up the bone surfaces to stimulate new cartilage growth. Loose bodies are usually removed. Torn menisci may be trimmed or repaired.   [employer] Patient Information 2012 [place], [place].

## 2012-01-15 ENCOUNTER — Ambulatory Visit: Payer: Self-pay | Admitting: Neurological Surgery

## 2012-01-19 ENCOUNTER — Ambulatory Visit: Payer: Medicare Other | Admitting: Physical Medicine & Rehabilitation

## 2012-01-23 ENCOUNTER — Encounter: Payer: Medicare Other | Attending: Physical Medicine & Rehabilitation

## 2012-01-23 ENCOUNTER — Ambulatory Visit (HOSPITAL_BASED_OUTPATIENT_CLINIC_OR_DEPARTMENT_OTHER): Payer: Medicare Other | Admitting: Physical Medicine & Rehabilitation

## 2012-01-23 ENCOUNTER — Encounter: Payer: Self-pay | Admitting: Physical Medicine & Rehabilitation

## 2012-01-23 DIAGNOSIS — R4789 Other speech disturbances: Secondary | ICD-10-CM | POA: Insufficient documentation

## 2012-01-23 DIAGNOSIS — I619 Nontraumatic intracerebral hemorrhage, unspecified: Secondary | ICD-10-CM | POA: Insufficient documentation

## 2012-01-23 DIAGNOSIS — R209 Unspecified disturbances of skin sensation: Secondary | ICD-10-CM | POA: Insufficient documentation

## 2012-01-23 DIAGNOSIS — M1710 Unilateral primary osteoarthritis, unspecified knee: Secondary | ICD-10-CM | POA: Insufficient documentation

## 2012-01-23 DIAGNOSIS — R29898 Other symptoms and signs involving the musculoskeletal system: Secondary | ICD-10-CM | POA: Insufficient documentation

## 2012-01-23 DIAGNOSIS — M79609 Pain in unspecified limb: Secondary | ICD-10-CM | POA: Insufficient documentation

## 2012-01-23 NOTE — Progress Notes (Signed)
Knee injection Right knee with ultrasound guidance  Indication: Right Knee pain not relieved by medication management and other conservative care.  Informed consent was obtained after describing risks and benefits of the procedure with the patient, this includes bleeding, bruising, infection and medication side effects. The patient wishes to proceed and has given written consent. The patient was placed in a recumbent position. The medial aspect of the knee was marked and prepped with Betadine and alcohol. It was then entered with a 25-gauge 1-1/2 inch needle and 1 mL of 1% lidocaine was injected into the skin and subcutaneous tissue. Then another 40 mm echo block needle needle was inserted into the knee joint. After negative draw back for blood, a solution containing one ML of 40 mg per mL depomedrol and 3 mL of 1% lidocaine were injected. The patient tolerated the procedure well. Post procedure instructions were given. Right knee pain and right hip pain were improved after the injection   In addition I reviewed the MRI scan performed at [facility] performed on 01/15/2012. There is evidence of multilevel lumbar stenosis at L2-3, L3-4, L4-5, L5-S1 levels. The major findings are at L4-5 and at L5-S1. The report does indicate L3-4. We will see how the patient does with the knee injection and determine whether or not a caudal epidural steroid injection will be needed at next visit.  Examination today reveals no swelling at the knee however on ultrasound there is evidence of suprapatellar bursitis. In addition we reviewed the x-rays and looked at the films.  Counseling and coordination of care was provided. Visit time was 25 minutes. This is in addition to the actual procedure.

## 2012-01-23 NOTE — Patient Instructions (Signed)
Knee Injection Joint injections are shots. Your caregiver will place a needle into your knee joint. The needle is used to put medicine into the joint. These shots can be used to help treat different painful knee conditions such as osteoarthritis, bursitis, local flare-ups of rheumatoid arthritis, and pseudogout. Anti-inflammatory medicines such as corticosteroids and anesthetics are the most common medicines used for joint and soft tissue injections.  PROCEDURE  The skin over the kneecap will be cleaned with an antiseptic solution.   Your caregiver will inject a small amount of a local anesthetic (a medicine like Novocaine) just under the skin in the area that was cleaned.   After the area becomes numb, a second injection is done. This second injection usually includes an anesthetic and an anti-inflammatory medicine called a steroid or cortisone. The needle is carefully placed in between the kneecap and the knee, and the medicine is injected into the joint space.   After the injection is done, the needle is removed. Your caregiver may place a bandage over the injection site. The whole procedure takes no more than a couple of minutes.  BEFORE THE PROCEDURE  Wash all of the skin around the entire knee area. Try to remove any loose, scaling skin. There is no other specific preparation necessary unless advised otherwise by your caregiver. LET YOUR CAREGIVER KNOW ABOUT:   Allergies.   Medications taken including herbs, eye drops, over the counter medications, and creams.   Use of steroids (by mouth or creams).   Possible pregnancy, if applicable.   Previous problems with anesthetics or Novocaine.   History of blood clots (thrombophlebitis).   History of bleeding or blood problems.   Previous surgery.   Other health problems.  RISKS AND COMPLICATIONS Side effects from cortisone shots are rare. They include:   Slight bruising of the skin.   Shrinkage of the normal fatty tissue under the  skin where the shot was given.   Increase in pain after the shot.   Infection.   Weakening of tendons or tendon rupture.   Allergic reaction to the medicine.   Diabetics may have a temporary increase in their blood sugar after a shot.   Cortisone can temporarily weaken the immune system. While receiving these shots, you should not get certain vaccines. Also, avoid contact with anyone who has chickenpox or measles. Especially if you have never had these diseases or have not been previously immunized. Your immune system may not be strong enough to fight off the infection while the cortisone is in your system.  AFTER THE PROCEDURE   You can go home after the procedure.   You may need to put ice on the joint 15 to 20 minutes every 3 or 4 hours until the pain goes away.   You may need to put an elastic bandage on the joint.  HOME CARE INSTRUCTIONS   Only take over-the-counter or prescription medicines for pain, discomfort, or fever as directed by your caregiver.   You should avoid stressing the joint. Unless advised otherwise, avoid activities that put a lot of pressure on a knee joint, such as:   Jogging.   Bicycling.   Recreational climbing.   Hiking.   Laying down and elevating the leg/knee above the level of your heart can help to minimize swelling.  SEEK MEDICAL CARE IF:   You have repeated or worsening swelling.   There is drainage from the puncture area.   You develop red streaking that extends above or below the   site where the needle was inserted.  SEEK IMMEDIATE MEDICAL CARE IF:   You develop a fever.   You have pain that gets worse even though you are taking pain medicine.   The area is red and warm, and you have trouble moving the joint.  MAKE SURE YOU:   Understand these instructions.   Will watch your condition.   Will get help right away if you are not doing well or get worse.  Document Released: 01/11/2007 Document Revised: 10/09/2011 Document  Reviewed: 10/08/2007 [employer] Patient Information 2012 [employer]. 

## 2012-01-26 ENCOUNTER — Encounter: Payer: Self-pay | Admitting: Physical Medicine & Rehabilitation

## 2012-02-04 ENCOUNTER — Encounter: Payer: Self-pay | Admitting: Physical Medicine & Rehabilitation

## 2012-02-23 ENCOUNTER — Ambulatory Visit: Payer: Medicare Other | Admitting: Physical Medicine & Rehabilitation

## 2012-02-23 ENCOUNTER — Encounter: Payer: Medicare Other | Attending: Physical Medicine & Rehabilitation

## 2012-02-23 ENCOUNTER — Encounter: Payer: Self-pay | Admitting: Physical Medicine & Rehabilitation

## 2012-02-23 ENCOUNTER — Ambulatory Visit (HOSPITAL_BASED_OUTPATIENT_CLINIC_OR_DEPARTMENT_OTHER): Payer: Medicare Other | Admitting: Physical Medicine & Rehabilitation

## 2012-02-23 VITALS — BP 129/56 | HR 77 | Resp 16 | Ht 68.0 in | Wt 141.2 lb

## 2012-02-23 DIAGNOSIS — R209 Unspecified disturbances of skin sensation: Secondary | ICD-10-CM | POA: Insufficient documentation

## 2012-02-23 DIAGNOSIS — IMO0002 Reserved for concepts with insufficient information to code with codable children: Secondary | ICD-10-CM

## 2012-02-23 DIAGNOSIS — M25569 Pain in unspecified knee: Secondary | ICD-10-CM

## 2012-02-23 DIAGNOSIS — I619 Nontraumatic intracerebral hemorrhage, unspecified: Secondary | ICD-10-CM | POA: Insufficient documentation

## 2012-02-23 DIAGNOSIS — R4789 Other speech disturbances: Secondary | ICD-10-CM | POA: Insufficient documentation

## 2012-02-23 DIAGNOSIS — M25561 Pain in right knee: Secondary | ICD-10-CM

## 2012-02-23 DIAGNOSIS — M79609 Pain in unspecified limb: Secondary | ICD-10-CM | POA: Insufficient documentation

## 2012-02-23 DIAGNOSIS — R29898 Other symptoms and signs involving the musculoskeletal system: Secondary | ICD-10-CM | POA: Insufficient documentation

## 2012-02-23 NOTE — Progress Notes (Signed)
  PROCEDURE RECORD [facility]   Name: Charles Tapia DOB:1918/06/27 MRN: [id number]  Date:02/23/2012  Physician: Claudette Laws, MD    Nurse/CMA: Ruben Pyka RN  Allergies:  Allergies  Allergen Reactions  . Ace Inhibitors     Hyperkalemia and renal insufficiency   . Penicillins Hives and Rash    Consent Signed: yes  Is patient diabetic? no  CBG today? n/a  Pregnant: no LMP: No LMP for male patient. (age 76-55)  Anticoagulants: no Anti-inflammatory: no Antibiotics: no  Procedure: Transforaminal Lumbar Epidural Injection of steroid Position: Prone Start Time: 1:41 End Time: 1:45 Fluoro Time: 13 sec  RN/CMA Levens CMA Blane Worthington RN    Time 1:00 1:50    BP 129/56 137/49    Pulse 77 70    Respirations   16 16    O2 Sat 98% 100%    S/S 6 6    Pain Level 8/10 1/10     D/C home with Ulanda Edison, patient A & O X 3, D/C instructions reviewed, and sits independently.

## 2012-02-23 NOTE — Patient Instructions (Signed)
Epidural Steroid Injection Care After  Refer to this sheet in the next few weeks. These instructions provide you with information on caring for yourself after your procedure. Your caregiver may also give you more specific instructions. Your treatment has been planned according to current medical practices, but problems sometimes occur. Call your caregiver if you have any problems or questions after your procedure. HOME CARE INSTRUCTIONS   Avoid the use of heat on the injection site for a day.   Do not have a tub bath or soak in water for the rest of the day.   Remove the bandage on the next day.   Resume your normal activities on the next day.   Use ice packs or mild pain relievers to reduce the soreness around the injection site.   Follow up with your caregiver 7 to 10 days after the procedure.  SEEK MEDICAL CARE IF:   You develop a fever of more than 100.5 F (38.1 C).   You continue to have pain and soreness over the injection site even after taking medicines.   You develop significant nausea or vomiting.  SEEK IMMEDIATE MEDICAL CARE IF:   You have severe back pain, which is not relieved by medicines.   You develop severe headache, stiff neck, or sensitivity to light.   You develop any new numbness or weakness of your legs.   You lose control over your bladder or bowel movements.   You develop a fever of more than 102 F (38.9 C).   You develop difficulty breathing.  Document Released: 02/04/2011 Document Revised: 10/09/2011 Document Reviewed: 02/04/2011 [employer] Patient Information 2012 [employer]. 

## 2012-02-23 NOTE — Progress Notes (Signed)
Lumbar right L3-L4 transforaminal epidural steroid injection under fluoroscopic guidance  Indication: Lumbosacral radiculitis is not relieved by medication management or other conservative care and interfering with self-care and mobility.   Informed consent was obtained after describing risk and benefits of the procedure with the patient, this includes bleeding, bruising, infection, paralysis and medication side effects.  The patient wishes to proceed and has given written consent.  Patient was placed in prone position.  The lumbar area was marked and prepped with Betadine.  It was entered with a 25-gauge 1-1/2 inch needle and one mL of 1% lidocaine was injected into the skin and subcutaneous tissue.  Then a 22-gauge 3.5 spinal needle was inserted into the right L3-L4 intervertebral foramen under AP, lateral, and oblique view. Omnipaque-180 was injected x2 ML with good nerve root outline however minimal epidural spread.  Then a solution containing one mL of 10 mg per mL dexamethasone and 2 mL of 1% lidocaine was injected.  The patient tolerated procedure well.  Post procedure instructions were given.  Please see post procedure form.

## 2012-02-23 NOTE — Progress Notes (Signed)
  Subjective:    Patient ID: Charles Tapia, male    DOB: 06-Oct-1918, 78 y.o.   MRN: [id number]  HPI  The patient only had today's relief with a right knee injection. He continues step back pain buttocks pain and knee pain on the right side. The patient's daughter brought in his MRI from 01/14/2012 done at [facility]. I reviewed this. The patient has multilevel spinal stenosis L3-L4, L4-L5, and L5-S1.  Past medical history 1. Right putaminal hemorrhage.  2. Hypertension.  3. Dyslipidemia.  4. Lumbago.  5. Acute renal failure, resolved.   Pain Inventory Average Pain 8 Pain Right Now 8 My pain is sharp and stabbing  In the last 24 hours, has pain interfered with the following? General activity 8 Relation with others 8 Enjoyment of life 4 What TIME of day is your pain at its worst? daytime Sleep (in general) Good  Pain is worse with: walking and standing Pain improves with: rest and heat/ice Relief from Meds: 0  Mobility use a cane how many minutes can you walk? little ability to climb steps?  no do you drive?  yes  Function retired  Neuro/Psych trouble walking  Prior Studies Any changes since last visit?  no  Physicians involved in your care Any changes since last visit?  no   Review of Systems  All other systems reviewed and are negative.       Objective:   Physical Exam  Constitutional: He appears well-developed and well-nourished.  Musculoskeletal:       Right knee: He exhibits normal range of motion, no swelling, no effusion and no deformity. tenderness found. Medial joint line and lateral joint line tenderness noted. No patellar tendon tenderness noted.  Neurological: He has normal strength. A sensory deficit is present.       Decreased sensation at the right knee compared to the left knee. Hands are equal right and left side.       Assessment & Plan:  1. Back pain and right knee pain. No significant relief with right knee injection.  X-rays showed mild degenerative changes in the right knee. He does have significant lumbar spinal stenosis multilevel worse on the right side than on left side. I do feel that this is what's causing the majority of his right knee pain. Patient has had L4 transforaminal injection on the right side in the past. This was in 2011. We discussed the fact that this is a multilevel stenosis in that he may be symptomatic for more than one level.. based on technical concerns as well as his pain pattern will do a right L3-L4 transforaminal injection today. I discussed this with the patient and his daughter. They agree with this plan.

## 2012-03-23 ENCOUNTER — Ambulatory Visit (HOSPITAL_BASED_OUTPATIENT_CLINIC_OR_DEPARTMENT_OTHER): Payer: Medicare Other | Admitting: Physical Medicine & Rehabilitation

## 2012-03-23 ENCOUNTER — Encounter: Payer: Medicare Other | Attending: Physical Medicine & Rehabilitation

## 2012-03-23 ENCOUNTER — Encounter: Payer: Self-pay | Admitting: Physical Medicine & Rehabilitation

## 2012-03-23 VITALS — BP 137/62 | HR 80 | Resp 14 | Ht 68.0 in | Wt 140.0 lb

## 2012-03-23 DIAGNOSIS — R209 Unspecified disturbances of skin sensation: Secondary | ICD-10-CM | POA: Insufficient documentation

## 2012-03-23 DIAGNOSIS — R4789 Other speech disturbances: Secondary | ICD-10-CM | POA: Insufficient documentation

## 2012-03-23 DIAGNOSIS — I619 Nontraumatic intracerebral hemorrhage, unspecified: Secondary | ICD-10-CM | POA: Insufficient documentation

## 2012-03-23 DIAGNOSIS — R29898 Other symptoms and signs involving the musculoskeletal system: Secondary | ICD-10-CM | POA: Insufficient documentation

## 2012-03-23 DIAGNOSIS — M79609 Pain in unspecified limb: Secondary | ICD-10-CM | POA: Insufficient documentation

## 2012-03-23 DIAGNOSIS — M48061 Spinal stenosis, lumbar region without neurogenic claudication: Secondary | ICD-10-CM

## 2012-03-23 DIAGNOSIS — IMO0002 Reserved for concepts with insufficient information to code with codable children: Secondary | ICD-10-CM

## 2012-03-23 MED ORDER — TRAMADOL-ACETAMINOPHEN 37.5-325 MG PO TABS: 1.0000 | ORAL_TABLET | Freq: Three times a day (TID) | ORAL | Status: AC | PRN

## 2012-03-23 NOTE — Progress Notes (Signed)
  Subjective:    Patient ID: Edinson Domeier, male    DOB: 1918-01-11, 78 y.o.   MRN: [id number]  HPI  R L3-4 injection helped back but not RLE pain.  No complications Pain Inventory Average Pain 8 Pain Right Now 7 My pain is sharp  In the last 24 hours, has pain interfered with the following? General activity 7 Relation with others 0 Enjoyment of life 5 What TIME of day is your pain at its worst? daytime Sleep (in general) Fair  Pain is worse with: walking and standing Pain improves with: rest and heat/ice Relief from Meds: 0  Mobility use a walker how many minutes can you walk? 5 ability to climb steps?  yes do you drive?  yes  Function retired I need assistance with the following:  meal prep and household duties  Neuro/Psych trouble walking  Prior Studies x-rays CT/MRI  Physicians involved in your care Any changes since last visit?  no  Review of Systems  Constitutional: Negative.   HENT: Negative.   Eyes: Negative.   Respiratory: Negative.   Cardiovascular: Negative.   Gastrointestinal: Negative.   Genitourinary: Negative.   Musculoskeletal: Positive for gait problem.  Skin: Negative.   Neurological: Negative.   Hematological: Negative.   Psychiatric/Behavioral: Negative.        Objective:   Physical Exam  Musculoskeletal:       Right knee: He exhibits decreased range of motion and deformity. no tenderness found.       Lumbar back: He exhibits decreased range of motion and deformity. He exhibits no tenderness.  Neurological: Gait abnormal.       4/5 R Quad , HF, Ankle 5/5 L Quad, HF, Ankle 4/5 left bicep, tricep, grip 5/5 right bicep, tricep, grip, deltoid Stenotic-appearing gait.       Assessment & Plan:    1. Lumbar spinal stenosis multilevel which did not respond to epidural injections or to physical therapy. We'll start him on low dose of Ultracet. Given his high risk for falling we'll avoid narcotic analgesics. He also had a stroke  recently. I advised him to follow up with neurosurgery to see if there's any other surgical options for him.  Will trial Ultracet and followup with PA in 2 months

## 2012-03-23 NOTE — Patient Instructions (Addendum)
Please see Dr. Yetta Barre this neurosurgeon for followup  Tried Ultracet 3 times a day as needed Your next visit will be with my physician assistant to see whether the medication I prescribed is helping you The medication can cost and drowsiness although I'm using a very small dose and should not cause a lot of problems

## 2012-05-20 ENCOUNTER — Ambulatory Visit: Payer: Medicare Other | Admitting: Physical Medicine and Rehabilitation

## 2012-10-08 ENCOUNTER — Emergency Department: Payer: Self-pay | Admitting: Emergency Medicine

## 2012-10-08 LAB — COMPREHENSIVE METABOLIC PANEL
Albumin: 3.6 g/dL (ref 3.4–5.0)
Anion Gap: 7 (ref 7–16)
Calcium, Total: 9.1 mg/dL (ref 8.5–10.1)
Chloride: 105 mmol/L (ref 98–107)
EGFR (African American): >60
Glucose: 140 mg/dL — ABNORMAL HIGH (ref 65–99)
Osmolality: 284 (ref 275–301)
Potassium: 3.9 mmol/L (ref 3.5–5.1)
SGOT(AST): 30 U/L (ref 15–37)
Sodium: 139 mmol/L (ref 136–145)
Total Protein: 6.6 g/dL (ref 6.4–8.2)

## 2012-10-08 LAB — CBC
HGB: 11.9 g/dL — ABNORMAL LOW (ref 13.0–18.0)
MCHC: 34.1 g/dL (ref 32.0–36.0)
RBC: 3.27 10*6/uL — ABNORMAL LOW (ref 4.40–5.90)
WBC: 6.5 10*3/uL (ref 3.8–10.6)

## 2012-10-09 LAB — URINALYSIS, COMPLETE
Bilirubin,UR: NEGATIVE
Blood: NEGATIVE
Ph: 7 (ref 4.5–8.0)
Protein: NEGATIVE
Specific Gravity: 1.009 (ref 1.003–1.030)
Squamous Epithelial: NONE SEEN

## 2014-11-21 DIAGNOSIS — T360X5S Adverse effect of penicillins, sequela: Secondary | ICD-10-CM | POA: Diagnosis not present

## 2014-11-21 DIAGNOSIS — G459 Transient cerebral ischemic attack, unspecified: Secondary | ICD-10-CM | POA: Diagnosis not present

## 2014-11-21 DIAGNOSIS — Z7901 Long term (current) use of anticoagulants: Secondary | ICD-10-CM | POA: Diagnosis not present

## 2014-11-21 DIAGNOSIS — I4891 Unspecified atrial fibrillation: Secondary | ICD-10-CM | POA: Diagnosis not present

## 2014-11-28 DIAGNOSIS — H25813 Combined forms of age-related cataract, bilateral: Secondary | ICD-10-CM | POA: Diagnosis not present

## 2014-12-22 DIAGNOSIS — I4891 Unspecified atrial fibrillation: Secondary | ICD-10-CM | POA: Diagnosis not present

## 2014-12-22 DIAGNOSIS — G459 Transient cerebral ischemic attack, unspecified: Secondary | ICD-10-CM | POA: Diagnosis not present

## 2015-01-08 DIAGNOSIS — H25811 Combined forms of age-related cataract, right eye: Secondary | ICD-10-CM | POA: Diagnosis not present

## 2015-01-08 DIAGNOSIS — H2511 Age-related nuclear cataract, right eye: Secondary | ICD-10-CM | POA: Diagnosis not present

## 2015-01-16 DIAGNOSIS — H2512 Age-related nuclear cataract, left eye: Secondary | ICD-10-CM | POA: Diagnosis not present

## 2015-01-19 DIAGNOSIS — I4891 Unspecified atrial fibrillation: Secondary | ICD-10-CM | POA: Diagnosis not present

## 2015-01-19 DIAGNOSIS — T360X5S Adverse effect of penicillins, sequela: Secondary | ICD-10-CM | POA: Diagnosis not present

## 2015-01-19 DIAGNOSIS — K625 Hemorrhage of anus and rectum: Secondary | ICD-10-CM | POA: Diagnosis not present

## 2015-02-05 DIAGNOSIS — H2512 Age-related nuclear cataract, left eye: Secondary | ICD-10-CM | POA: Diagnosis not present

## 2015-02-22 DIAGNOSIS — I4891 Unspecified atrial fibrillation: Secondary | ICD-10-CM | POA: Diagnosis not present

## 2015-02-22 DIAGNOSIS — H539 Unspecified visual disturbance: Secondary | ICD-10-CM | POA: Diagnosis not present

## 2015-02-22 DIAGNOSIS — Z1389 Encounter for screening for other disorder: Secondary | ICD-10-CM | POA: Diagnosis not present

## 2015-02-27 DIAGNOSIS — H3531 Nonexudative age-related macular degeneration: Secondary | ICD-10-CM | POA: Diagnosis not present

## 2015-03-23 DIAGNOSIS — I4891 Unspecified atrial fibrillation: Secondary | ICD-10-CM | POA: Diagnosis not present

## 2015-03-23 DIAGNOSIS — T360X5S Adverse effect of penicillins, sequela: Secondary | ICD-10-CM | POA: Diagnosis not present

## 2015-03-23 DIAGNOSIS — G459 Transient cerebral ischemic attack, unspecified: Secondary | ICD-10-CM | POA: Diagnosis not present

## 2015-03-29 DIAGNOSIS — H3531 Nonexudative age-related macular degeneration: Secondary | ICD-10-CM | POA: Diagnosis not present

## 2015-04-27 DIAGNOSIS — I4891 Unspecified atrial fibrillation: Secondary | ICD-10-CM | POA: Diagnosis not present

## 2015-05-17 DIAGNOSIS — C44122 Squamous cell carcinoma of skin of right eyelid, including canthus: Secondary | ICD-10-CM | POA: Diagnosis not present

## 2015-05-25 DIAGNOSIS — I4891 Unspecified atrial fibrillation: Secondary | ICD-10-CM | POA: Diagnosis not present

## 2015-05-25 DIAGNOSIS — G459 Transient cerebral ischemic attack, unspecified: Secondary | ICD-10-CM | POA: Diagnosis not present

## 2015-05-28 DIAGNOSIS — C44122 Squamous cell carcinoma of skin of right eyelid, including canthus: Secondary | ICD-10-CM | POA: Diagnosis not present

## 2015-05-28 DIAGNOSIS — L821 Other seborrheic keratosis: Secondary | ICD-10-CM | POA: Diagnosis not present

## 2015-06-29 DIAGNOSIS — M7051 Other bursitis of knee, right knee: Secondary | ICD-10-CM | POA: Diagnosis not present

## 2015-06-29 DIAGNOSIS — R69 Illness, unspecified: Secondary | ICD-10-CM | POA: Diagnosis not present

## 2015-06-29 DIAGNOSIS — I4891 Unspecified atrial fibrillation: Secondary | ICD-10-CM | POA: Diagnosis not present

## 2015-08-02 DIAGNOSIS — I4891 Unspecified atrial fibrillation: Secondary | ICD-10-CM | POA: Diagnosis not present

## 2015-08-02 DIAGNOSIS — M4800 Spinal stenosis, site unspecified: Secondary | ICD-10-CM | POA: Diagnosis not present

## 2015-08-03 DIAGNOSIS — Z23 Encounter for immunization: Secondary | ICD-10-CM | POA: Diagnosis not present

## 2015-08-22 DIAGNOSIS — I10 Essential (primary) hypertension: Secondary | ICD-10-CM | POA: Diagnosis not present

## 2015-08-22 DIAGNOSIS — D508 Other iron deficiency anemias: Secondary | ICD-10-CM | POA: Diagnosis not present

## 2015-08-24 DIAGNOSIS — R739 Hyperglycemia, unspecified: Secondary | ICD-10-CM | POA: Diagnosis not present

## 2015-08-31 DIAGNOSIS — G459 Transient cerebral ischemic attack, unspecified: Secondary | ICD-10-CM | POA: Diagnosis not present

## 2015-08-31 DIAGNOSIS — I4891 Unspecified atrial fibrillation: Secondary | ICD-10-CM | POA: Diagnosis not present

## 2015-08-31 DIAGNOSIS — M4806 Spinal stenosis, lumbar region: Secondary | ICD-10-CM | POA: Diagnosis not present

## 2015-10-05 DIAGNOSIS — I4891 Unspecified atrial fibrillation: Secondary | ICD-10-CM | POA: Diagnosis not present

## 2015-10-05 DIAGNOSIS — T360X5S Adverse effect of penicillins, sequela: Secondary | ICD-10-CM | POA: Diagnosis not present

## 2015-10-05 DIAGNOSIS — M4806 Spinal stenosis, lumbar region: Secondary | ICD-10-CM | POA: Diagnosis not present

## 2015-10-05 DIAGNOSIS — G459 Transient cerebral ischemic attack, unspecified: Secondary | ICD-10-CM | POA: Diagnosis not present

## 2015-11-06 DIAGNOSIS — I4891 Unspecified atrial fibrillation: Secondary | ICD-10-CM | POA: Diagnosis not present

## 2015-11-06 DIAGNOSIS — R42 Dizziness and giddiness: Secondary | ICD-10-CM | POA: Diagnosis not present

## 2015-11-06 DIAGNOSIS — R55 Syncope and collapse: Secondary | ICD-10-CM | POA: Diagnosis not present

## 2015-11-06 DIAGNOSIS — G450 Vertebro-basilar artery syndrome: Secondary | ICD-10-CM | POA: Diagnosis not present

## 2015-11-08 DIAGNOSIS — G459 Transient cerebral ischemic attack, unspecified: Secondary | ICD-10-CM | POA: Diagnosis not present

## 2015-11-08 DIAGNOSIS — G453 Amaurosis fugax: Secondary | ICD-10-CM | POA: Diagnosis not present

## 2015-11-08 DIAGNOSIS — R55 Syncope and collapse: Secondary | ICD-10-CM | POA: Diagnosis not present

## 2015-11-08 DIAGNOSIS — R42 Dizziness and giddiness: Secondary | ICD-10-CM | POA: Diagnosis not present

## 2015-11-09 ENCOUNTER — Emergency Department: Payer: Commercial Managed Care - HMO

## 2015-11-09 ENCOUNTER — Inpatient Hospital Stay
Admission: EM | Admit: 2015-11-09 | Discharge: 2015-11-13 | DRG: 193 | Disposition: A | Discharge status: Home / Self Care | Payer: Commercial Managed Care - HMO | Attending: Internal Medicine | Admitting: Internal Medicine

## 2015-11-09 DIAGNOSIS — I2510 Atherosclerotic heart disease of native coronary artery without angina pectoris: Secondary | ICD-10-CM | POA: Diagnosis present

## 2015-11-09 DIAGNOSIS — Z880 Allergy status to penicillin: Secondary | ICD-10-CM

## 2015-11-09 DIAGNOSIS — Z9049 Acquired absence of other specified parts of digestive tract: Secondary | ICD-10-CM | POA: Diagnosis not present

## 2015-11-09 DIAGNOSIS — J189 Pneumonia, unspecified organism: Secondary | ICD-10-CM | POA: Diagnosis not present

## 2015-11-09 DIAGNOSIS — I4891 Unspecified atrial fibrillation: Secondary | ICD-10-CM | POA: Diagnosis present

## 2015-11-09 DIAGNOSIS — I429 Cardiomyopathy, unspecified: Secondary | ICD-10-CM | POA: Diagnosis not present

## 2015-11-09 DIAGNOSIS — N400 Enlarged prostate without lower urinary tract symptoms: Secondary | ICD-10-CM | POA: Diagnosis present

## 2015-11-09 DIAGNOSIS — B9689 Other specified bacterial agents as the cause of diseases classified elsewhere: Secondary | ICD-10-CM | POA: Diagnosis not present

## 2015-11-09 DIAGNOSIS — I10 Essential (primary) hypertension: Secondary | ICD-10-CM | POA: Diagnosis present

## 2015-11-09 DIAGNOSIS — J180 Bronchopneumonia, unspecified organism: Secondary | ICD-10-CM | POA: Diagnosis not present

## 2015-11-09 DIAGNOSIS — E860 Dehydration: Secondary | ICD-10-CM | POA: Diagnosis present

## 2015-11-09 DIAGNOSIS — J9601 Acute respiratory failure with hypoxia: Secondary | ICD-10-CM | POA: Diagnosis not present

## 2015-11-09 DIAGNOSIS — M109 Gout, unspecified: Secondary | ICD-10-CM | POA: Diagnosis present

## 2015-11-09 DIAGNOSIS — Z833 Family history of diabetes mellitus: Secondary | ICD-10-CM | POA: Diagnosis not present

## 2015-11-09 DIAGNOSIS — R748 Abnormal levels of other serum enzymes: Secondary | ICD-10-CM | POA: Diagnosis not present

## 2015-11-09 DIAGNOSIS — R7989 Other specified abnormal findings of blood chemistry: Secondary | ICD-10-CM

## 2015-11-09 DIAGNOSIS — R531 Weakness: Secondary | ICD-10-CM

## 2015-11-09 DIAGNOSIS — Z789 Other specified health status: Secondary | ICD-10-CM

## 2015-11-09 DIAGNOSIS — Z8673 Personal history of transient ischemic attack (TIA), and cerebral infarction without residual deficits: Secondary | ICD-10-CM | POA: Diagnosis not present

## 2015-11-09 DIAGNOSIS — M6281 Muscle weakness (generalized): Secondary | ICD-10-CM | POA: Diagnosis not present

## 2015-11-09 DIAGNOSIS — R778 Other specified abnormalities of plasma proteins: Secondary | ICD-10-CM

## 2015-11-09 DIAGNOSIS — N170 Acute kidney failure with tubular necrosis: Secondary | ICD-10-CM | POA: Diagnosis not present

## 2015-11-09 DIAGNOSIS — I248 Other forms of acute ischemic heart disease: Secondary | ICD-10-CM | POA: Diagnosis not present

## 2015-11-09 DIAGNOSIS — Z888 Allergy status to other drugs, medicaments and biological substances status: Secondary | ICD-10-CM

## 2015-11-09 DIAGNOSIS — Z951 Presence of aortocoronary bypass graft: Secondary | ICD-10-CM

## 2015-11-09 DIAGNOSIS — H9190 Unspecified hearing loss, unspecified ear: Secondary | ICD-10-CM | POA: Diagnosis present

## 2015-11-09 DIAGNOSIS — R4182 Altered mental status, unspecified: Secondary | ICD-10-CM | POA: Diagnosis not present

## 2015-11-09 DIAGNOSIS — N179 Acute kidney failure, unspecified: Secondary | ICD-10-CM | POA: Diagnosis not present

## 2015-11-09 DIAGNOSIS — R05 Cough: Secondary | ICD-10-CM | POA: Diagnosis not present

## 2015-11-09 DIAGNOSIS — R7881 Bacteremia: Secondary | ICD-10-CM | POA: Diagnosis not present

## 2015-11-09 LAB — COMPREHENSIVE METABOLIC PANEL
ALBUMIN: 3.8 g/dL (ref 3.5–5.0)
ALK PHOS: 91 U/L (ref 38–126)
ALT: 19 U/L (ref 17–63)
AST: 25 U/L (ref 15–41)
Anion gap: 9 (ref 5–15)
BILIRUBIN TOTAL: 2.3 mg/dL — AB (ref 0.3–1.2)
BUN: 27 mg/dL — AB (ref 6–20)
CALCIUM: 9.1 mg/dL (ref 8.9–10.3)
CO2: 26 mmol/L (ref 22–32)
Chloride: 101 mmol/L (ref 101–111)
Creatinine, Ser: 1.3 mg/dL — ABNORMAL HIGH (ref 0.61–1.24)
GFR calc Af Amer: 52 mL/min — ABNORMAL LOW (ref >60)
GFR calc non Af Amer: 45 mL/min — ABNORMAL LOW (ref >60)
GLUCOSE: 213 mg/dL — AB (ref 65–99)
Potassium: 3.6 mmol/L (ref 3.5–5.1)
Sodium: 136 mmol/L (ref 135–145)
TOTAL PROTEIN: 7.2 g/dL (ref 6.5–8.1)

## 2015-11-09 LAB — URINALYSIS COMPLETE WITH MICROSCOPIC (ARMC ONLY)
BACTERIA UA: NONE SEEN
Bilirubin Urine: NEGATIVE
GLUCOSE, UA: NEGATIVE mg/dL
Ketones, ur: NEGATIVE mg/dL
LEUKOCYTES UA: NEGATIVE
Nitrite: NEGATIVE
PROTEIN: 100 mg/dL — AB
Specific Gravity, Urine: 1.017 (ref 1.005–1.030)
pH: 5 (ref 5.0–8.0)

## 2015-11-09 LAB — CBC
HCT: 38.5 % — ABNORMAL LOW (ref 40.0–52.0)
Hemoglobin: 12.9 g/dL — ABNORMAL LOW (ref 13.0–18.0)
MCH: 35.8 pg — AB (ref 26.0–34.0)
MCHC: 33.6 g/dL (ref 32.0–36.0)
MCV: 106.6 fL — ABNORMAL HIGH (ref 80.0–100.0)
Platelets: 204 10*3/uL (ref 150–440)
RBC: 3.61 MIL/uL — ABNORMAL LOW (ref 4.40–5.90)
RDW: 14.8 % — AB (ref 11.5–14.5)
WBC: 10.7 10*3/uL — ABNORMAL HIGH (ref 3.8–10.6)

## 2015-11-09 LAB — BRAIN NATRIURETIC PEPTIDE: B NATRIURETIC PEPTIDE 5: 434 pg/mL — AB (ref 0.0–100.0)

## 2015-11-09 LAB — PROTIME-INR
INR: 2.56
INR: 3
PROTHROMBIN TIME: 27.2 s — AB (ref 11.4–15.0)
Prothrombin Time: 30.6 seconds — ABNORMAL HIGH (ref 11.4–15.0)

## 2015-11-09 LAB — TROPONIN I
Troponin I: 0.1 ng/mL — ABNORMAL HIGH (ref <0.031)
Troponin I: <0.03 ng/mL (ref <0.031)

## 2015-11-09 MED ORDER — LEVOFLOXACIN IN D5W 500 MG/100ML IV SOLN
500.0000 mg | Freq: Once | INTRAVENOUS | Status: AC
  Administered 2015-11-10: 500 mg via INTRAVENOUS
  Filled 2015-11-09: qty 100

## 2015-11-09 MED ORDER — SIMVASTATIN 20 MG PO TABS
20.0000 mg | ORAL_TABLET | Freq: Every day | ORAL | Status: DC
  Administered 2015-11-09 – 2015-11-12 (×4): 20 mg via ORAL
  Filled 2015-11-09 (×4): qty 1

## 2015-11-09 MED ORDER — WARFARIN SODIUM 2.5 MG PO TABS
2.5000 mg | ORAL_TABLET | Freq: Every day | ORAL | Status: DC
  Administered 2015-11-10: 2.5 mg via ORAL
  Filled 2015-11-09 (×3): qty 1

## 2015-11-09 MED ORDER — LEVOFLOXACIN IN D5W 250 MG/50ML IV SOLN
250.0000 mg | INTRAVENOUS | Status: DC
  Administered 2015-11-11 – 2015-11-12 (×2): 250 mg via INTRAVENOUS
  Filled 2015-11-09 (×3): qty 50

## 2015-11-09 MED ORDER — SODIUM CHLORIDE 0.9 % IV BOLUS (SEPSIS)
500.0000 mL | Freq: Once | INTRAVENOUS | Status: AC
  Administered 2015-11-09: 500 mL via INTRAVENOUS

## 2015-11-09 MED ORDER — SODIUM CHLORIDE 0.9 % IV SOLN
INTRAVENOUS | Status: DC
  Administered 2015-11-09: 18:00:00 via INTRAVENOUS

## 2015-11-09 MED ORDER — DEXTROSE 5 % IV SOLN: 250.0000 mg | INTRAVENOUS | Status: DC

## 2015-11-09 MED ORDER — ALBUTEROL SULFATE (2.5 MG/3ML) 0.083% IN NEBU
1.2500 mg | INHALATION_SOLUTION | Freq: Once | RESPIRATORY_TRACT | Status: AC
  Administered 2015-11-09: 1.25 mg via RESPIRATORY_TRACT
  Filled 2015-11-09: qty 3

## 2015-11-09 MED ORDER — TAMSULOSIN HCL 0.4 MG PO CAPS
0.4000 mg | ORAL_CAPSULE | Freq: Every day | ORAL | Status: DC
  Administered 2015-11-10 – 2015-11-13 (×4): 0.4 mg via ORAL
  Filled 2015-11-09 (×5): qty 1

## 2015-11-09 MED ORDER — POLYETHYLENE GLYCOL 3350 17 G PO PACK
17.0000 g | PACK | Freq: Every day | ORAL | Status: DC
  Administered 2015-11-10 – 2015-11-12 (×3): 17 g via ORAL
  Filled 2015-11-09 (×4): qty 1

## 2015-11-09 MED ORDER — AMLODIPINE BESYLATE 5 MG PO TABS
5.0000 mg | ORAL_TABLET | Freq: Every day | ORAL | Status: DC
  Administered 2015-11-10 – 2015-11-13 (×4): 5 mg via ORAL
  Filled 2015-11-09 (×5): qty 1

## 2015-11-09 MED ORDER — WARFARIN - PHYSICIAN DOSING INPATIENT
Freq: Every day | Status: DC
Start: 2015-11-09 — End: 2015-11-11
  Administered 2015-11-10 – 2015-11-12 (×2)

## 2015-11-09 MED ORDER — DEXTROSE 5 % IV SOLN
500.0000 mg | Freq: Once | INTRAVENOUS | Status: AC
  Administered 2015-11-09: 500 mg via INTRAVENOUS
  Filled 2015-11-09: qty 500

## 2015-11-09 MED ORDER — ACETAMINOPHEN 500 MG PO TABS: 500.0000 mg | ORAL_TABLET | Freq: Four times a day (QID) | ORAL | Status: DC | PRN

## 2015-11-09 MED ORDER — METOPROLOL SUCCINATE ER 50 MG PO TB24
75.0000 mg | ORAL_TABLET | Freq: Every day | ORAL | Status: DC
  Administered 2015-11-10 – 2015-11-12 (×3): 75 mg via ORAL
  Filled 2015-11-09 (×4): qty 1

## 2015-11-09 MED ORDER — CALCIUM CARBONATE ANTACID 500 MG PO CHEW
600.0000 mg | CHEWABLE_TABLET | Freq: Every day | ORAL | Status: DC
  Administered 2015-11-09 – 2015-11-13 (×5): 500 mg via ORAL
  Filled 2015-11-09 (×5): qty 1

## 2015-11-09 NOTE — H&P (Signed)
Charles Tapia is an 82 y.o. male.   Chief Complaint: Cough  HPI: Pt is here with care giver who says she and his son could not get him to the door this morning. He seemed confused. Confusion better now. He complains of cough for past 2 days and not eating much. He is hard of hearing but seems to respond to questions appropriately. No recent chest pain or other symptoms.  Past Medical History  Diagnosis Date  . Hypertension   . Cardiomyopathy   . CAD (coronary artery disease)   . Gout   . Hyperlipidemia   . Mitral regurgitation   . Allergy   . Stroke [facility])     Right putaminal hemorrhage    Past Surgical History  Procedure Laterality Date  . Coronary bypass graft stenosis    . Coronary artery bypass graft    . Appendectomy    . Back surgery    . Spine surgery      Family History  Problem Relation Age of Onset  . Diabetes Son    Social History:  reports that he has never smoked. He has never used smokeless tobacco. He reports that he does not drink alcohol or use illicit drugs.  Allergies:  Allergies  Allergen Reactions  . Ace Inhibitors Other (See Comments)    Reaction:  Hyperkalemia and renal insufficiency   . Penicillins Hives and Other (See Comments)    Unable to obtain enough information to answer additional questions about this medication.       (Not in a hospital admission)  Results for orders placed or performed during the hospital encounter of 11/09/15 (from the past 48 hour(s))  Comprehensive metabolic panel     Status: Abnormal   Collection Time: 11/09/15 11:18 AM  Result Value Ref Range   Sodium 136 135 - 145 mmol/L   Potassium 3.6 3.5 - 5.1 mmol/L   Chloride 101 101 - 111 mmol/L   CO2 26 22 - 32 mmol/L   Glucose, Bld 213 (H) 65 - 99 mg/dL   BUN 27 (H) 6 - 20 mg/dL   Creatinine, Ser 1.30 (H) 0.61 - 1.24 mg/dL   Calcium 9.1 8.9 - 10.3 mg/dL   Total Protein 7.2 6.5 - 8.1 g/dL   Albumin 3.8 3.5 - 5.0 g/dL   AST 25 15 - 41 U/L   ALT 19 17 - 63 U/L   Alkaline Phosphatase 91 38 - 126 U/L   Total Bilirubin 2.3 (H) 0.3 - 1.2 mg/dL   GFR calc non Af Amer 45 (L) >60 mL/min   GFR calc Af Amer 52 (L) >60 mL/min    Comment: (NOTE) The eGFR has been calculated using the CKD EPI equation. This calculation has not been validated in all clinical situations. eGFR's persistently <60 mL/min signify possible Chronic Kidney Disease.    Anion gap 9 5 - 15  CBC     Status: Abnormal   Collection Time: 11/09/15 11:18 AM  Result Value Ref Range   WBC 10.7 (H) 3.8 - 10.6 K/uL   RBC 3.61 (L) 4.40 - 5.90 MIL/uL   Hemoglobin 12.9 (L) 13.0 - 18.0 g/dL   HCT 38.5 (L) 40.0 - 52.0 %   MCV 106.6 (H) 80.0 - 100.0 fL   MCH 35.8 (H) 26.0 - 34.0 pg   MCHC 33.6 32.0 - 36.0 g/dL   RDW 14.8 (H) 11.5 - 14.5 %   Platelets 204 150 - 440 K/uL  Protime-INR - (order if Patient is  taking Coumadin / Warfarin)     Status: Abnormal   Collection Time: 11/09/15 11:18 AM  Result Value Ref Range   Prothrombin Time 27.2 (H) 11.4 - 15.0 seconds   INR 2.56   Troponin I     Status: Abnormal   Collection Time: 11/09/15 11:18 AM  Result Value Ref Range   Troponin I 0.10 (H) <0.031 ng/mL    Comment: READ BACK AND VERIFIED WITH ANGELA ROBBINS AT 7619 11/09/2015 BY TFK        PERSISTENTLY INCREASED TROPONIN VALUES IN THE RANGE OF 0.04-0.49 ng/mL CAN BE SEEN IN:       -UNSTABLE ANGINA       -CONGESTIVE HEART FAILURE       -MYOCARDITIS       -CHEST TRAUMA       -ARRYHTHMIAS       -LATE PRESENTING MYOCARDIAL INFARCTION       -COPD   CLINICAL FOLLOW-UP RECOMMENDED.   Brain natriuretic peptide     Status: Abnormal   Collection Time: 11/09/15 11:18 AM  Result Value Ref Range   B Natriuretic Peptide 434.0 (H) 0.0 - 100.0 pg/mL  Urinalysis complete, with microscopic     Status: Abnormal   Collection Time: 11/09/15  3:01 PM  Result Value Ref Range   Color, Urine YELLOW (A) YELLOW   APPearance CLEAR (A) CLEAR   Glucose, UA NEGATIVE NEGATIVE mg/dL   Bilirubin Urine NEGATIVE  NEGATIVE   Ketones, ur NEGATIVE NEGATIVE mg/dL   Specific Gravity, Urine 1.017 1.005 - 1.030   Hgb urine dipstick 2+ (A) NEGATIVE   pH 5.0 5.0 - 8.0   Protein, ur 100 (A) NEGATIVE mg/dL   Nitrite NEGATIVE NEGATIVE   Leukocytes, UA NEGATIVE NEGATIVE   RBC / HPF 6-30 0 - 5 RBC/hpf   WBC, UA 0-5 0 - 5 WBC/hpf   Bacteria, UA NONE SEEN NONE SEEN   Squamous Epithelial / LPF 0-5 (A) NONE SEEN   Mucous PRESENT    Dg Chest 1 View  11/09/2015  CLINICAL DATA:  Cough, 1 week duration. EXAM: CHEST 1 VIEW COMPARISON:  10/08/2012 FINDINGS: Previous median sternotomy and CABG. Heart size is normal. Atherosclerosis of the aorta. There are extensive infiltrates in both lower lobes consistent with bronchopneumonia. Upper lungs are clear. No effusions. Chronic elevation of the right hemidiaphragm. IMPRESSION: Bilateral lower lobe bronchopneumonia. Electronically Signed   By: Nelson Chimes M.D.   On: 11/09/2015 15:06   Ct Head Wo Contrast  11/09/2015  CLINICAL DATA:  Patient with cough and altered mental status. EXAM: CT HEAD WITHOUT CONTRAST TECHNIQUE: Contiguous axial images were obtained from the base of the skull through the vertex without intravenous contrast. COMPARISON:  Brain CT 10/09/2012. FINDINGS: Ventricles and sulci are prominent compatible with atrophy. No evidence for acute cortically based infarct, intracranial hemorrhage, mass lesion or mass-effect. Orbits are unremarkable. Air-fluid levels demonstrated within frontal sinus, ethmoid air cells bilateral maxillary sinuses. Mastoid air cells are unremarkable. Calvarium is intact. IMPRESSION: No acute intracranial process. Cortical atrophy. Air-fluid levels within the paranasal sinuses most compatible with acute sinusitis. Electronically Signed   By: Lovey Newcomer M.D.   On: 11/09/2015 15:29    Review of Systems  Constitutional: Negative for fever and chills.  HENT: Positive for hearing loss.   Eyes: Negative for blurred vision.  Respiratory: Positive  for cough. Negative for sputum production.   Cardiovascular: Negative for chest pain.  Gastrointestinal: Negative for nausea and vomiting.  Genitourinary: Negative for dysuria.  Musculoskeletal: Negative for back pain.  Skin: Negative for rash.  Neurological: Negative for focal weakness and headaches.  Endo/Heme/Allergies: Does not bruise/bleed easily.    Blood pressure 166/94, pulse 104, temperature 98.3 F (36.8 C), temperature source Oral, resp. rate 20, height _0  (1.727 m), weight 67.132 kg (148 lb), SpO2 95 %. Physical Exam  Constitutional: He is oriented to person, place, and time. He appears well-developed and well-nourished. No distress.  Hard of hearing  HENT:  Head: Normocephalic and atraumatic.  Nose: Nose normal.  Mouth/Throat: No oropharyngeal exudate.  Oral pharnyx dry  Eyes: EOM are normal. Pupils are equal, round, and reactive to light. Right eye exhibits discharge. No scleral icterus.  Neck: Normal range of motion. Neck supple. No JVD present. No tracheal deviation present. No thyromegaly present.  Cardiovascular: Regular rhythm.   Murmur heard. Respiratory:  Scattered rhonci. No dullness to percussion. No use of accessary muscles.  GI: Soft. Bowel sounds are normal. He exhibits no distension and no mass. There is no tenderness. There is no rebound and no guarding.  Musculoskeletal: Normal range of motion. He exhibits edema. He exhibits no tenderness.  Lymphadenopathy:    He has no cervical adenopathy.  Neurological: He is alert and oriented to person, place, and time. No cranial nerve deficit.  Skin: Skin is warm and dry. No rash noted. No erythema.     Assessment/Plan 1. Pneumonia: CXR shows bilateral infiltrates.Will start abx. Pen allergic so will use meropenem. Cultures.  2. Acute Renal Failure: Mildly elevated Cr. Likely secondary to dehydration. Will hydrate gently since has cardiomyopathy.  3. Elevated Trop: No symptoms. Suspect demand ischemia.  Will repeat trop. Already on anticoagulation and B-blocker.  4. A-Fib: Rate controlled. Therapeutic INR.  5. Code Status: Pt expressed wishes to be full code.   Past medical records reviewed. Case discussed with Dr Thomasene Lot  T= 60 min  Baxter Hire 11/09/2015, 4:07 PM

## 2015-11-09 NOTE — ED Provider Notes (Signed)
[facility] Emergency Department Provider Note  ____________________________________________  Time seen: 1400  I have reviewed the triage vital signs and the nursing notes.  History by:  Patient, caretaker, history on the phone from patient's daughter  HISTORY  Chief Complaint Altered Mental Status     HPI Charles Tapia is a 82 y.o. male who lives at home with support. He has been having a bad cough this week and is now being brought to the emergency department due to decreased alertness and some altered mental status. I am told he is usually fairly alert and conversant.   The patient does appear to be conversant here in the emergency department. The people present report this is better than he was earlier in the day. He was too weak or unable to get up to open the door for them. They had to wait for someone to calm and unlock the door so they could have access to the house to check on the patient.  He has had a cough over the past few days. He has been seen by his primary physician, Dr. Rebecka Apley, this past Tuesday and has been undergoing testing this week, including EKG and echocardiogram.  The patient does have a notable cough. He's also had some rhinorrhea and discharge from his eyes.      Past Medical History  Diagnosis Date  . Hypertension   . Cardiomyopathy   . CAD (coronary artery disease)   . Gout   . Hyperlipidemia   . Mitral regurgitation   . Allergy   . Stroke [facility])     Right putaminal hemorrhage    Patient Active Problem List   Diagnosis Date Noted  . Knee osteoarthritis 01/23/2012  . Knee pain, right 01/01/2012  . Spinal stenosis, lumbar region, without neurogenic claudication 01/01/2012  . Thoracic or lumbosacral neuritis or radiculitis, unspecified 01/01/2012  . Intracerebral hemorrhage ([place]) 11/22/2011  . Hypertension 11/22/2011  . Hyperlipidemia 11/22/2011  . Coronary artery disease 11/22/2011  . Gout 11/22/2011  .  Cardiomyopathy 11/22/2011  . Mitral regurgitation 11/22/2011    Past Surgical History  Procedure Laterality Date  . Coronary bypass graft stenosis    . Coronary artery bypass graft    . Appendectomy    . Back surgery    . Spine surgery      Current Outpatient Rx  Name  Route  Sig  Dispense  Refill  . acetaminophen (TYLENOL) 500 MG tablet   Oral   Take 500 mg by mouth 2 (two) times daily.         Marland Kitchen allopurinol (ZYLOPRIM) 300 MG tablet   Oral   Take 300 mg by mouth daily.         Marland Kitchen EXPIRED: amLODipine (NORVASC) 5 MG tablet   Oral   Take 1 tablet (5 mg total) by mouth daily.   30 tablet   1   . calcium carbonate (OS-CAL) 600 MG TABS   Oral   Take 600 mg by mouth daily.         Marland Kitchen EXPIRED: doxazosin (CARDURA) 2 MG tablet   Oral   Take 1 tablet (2 mg total) by mouth daily.   30 tablet   1   . fish oil-omega-3 fatty acids 1000 MG capsule   Oral   Take 2 g by mouth daily.         . metoprolol succinate (TOPROL-XL) 50 MG 24 hr tablet   Oral   Take 50 mg by mouth  daily.         . metoprolol succinate (TOPROL-XL) 50 MG 24 hr tablet   Oral   Take 50 mg by mouth daily. Take 1 and half (75 mg total) dailyTake with or immediately following a meal.         . polyethylene glycol (MIRALAX / GLYCOLAX) packet   Oral   Take 17 g by mouth daily.         . simvastatin (ZOCOR) 20 MG tablet   Oral   Take 20 mg by mouth daily.           Allergies Ace inhibitors and Penicillins  Family History  Problem Relation Age of Onset  . Diabetes Son     Social History Social History  Substance Use Topics  . Smoking status: Never Smoker   . Smokeless tobacco: Never Used  . Alcohol Use: No    Review of Systems  Constitutional: Positive for general weakness.. ENT: Positive for congestion. Cardiovascular: Negative for chest pain. Respiratory: Positive for cough. Gastrointestinal: Negative for abdominal pain, vomiting and diarrhea. Genitourinary: Negative for  dysuria. Musculoskeletal: Positive for chronic back pain. Skin: Negative for rash. Neurological: Negative for headache or focal weakness, but positive for general weakness and mild altered mental status.   10-point ROS otherwise negative.  ____________________________________________   PHYSICAL EXAM:  VITAL SIGNS: ED Triage Vitals  Enc Vitals Group     BP 11/09/15 1112 166/94 mmHg     Pulse Rate 11/09/15 1112 104     Resp 11/09/15 1112 20     Temp 11/09/15 1112 98.3 F (36.8 C)     Temp Source 11/09/15 1112 Oral     SpO2 11/09/15 1112 95 %     Weight 11/09/15 1112 148 lb (67.132 kg)     Height 11/09/15 1112 5\' 8"  (1.727 m)     Head Cir --      Peak Flow --      Pain Score 11/09/15 1113 6     Pain Loc --      Pain Edu? --      Excl. in [place]? --     Constitutional:  Alert, communicative, but a little bit slow, and hard of hearing. No acute distress. ENT   Head: Normocephalic and atraumatic.   Nose: No congestion/rhinnorhea.       Mouth: No erythema, no swelling        Eyes: There is a mild to moderate amount of drainage discharge from the right eye and a small amount from the left. Cardiovascular: Regular rate at approximately 96 Respiratory:  Normal respiratory effort, no tachypnea. Notable cough.   Breath sounds are clear and equal bilaterally.  Gastrointestinal: Soft, no distention. Nontender Back: No muscle spasm, no tenderness, no CVA tenderness. Musculoskeletal: No deformity noted. Nontender with normal range of motion in all extremities.  No noted edema. Neurologic:  Communicative. Normal appearing spontaneous movement in all 4 extremities. No gross focal neurologic deficits are appreciated.  Skin:  Skin is warm, dry. No rash noted. Psychiatric: Alert, communicative, but with some limitation  ____________________________________________    LABS (pertinent positives/negatives)  Labs Reviewed  COMPREHENSIVE METABOLIC PANEL - Abnormal; Notable for the  following:    Glucose, Bld 213 (*)    BUN 27 (*)    Creatinine, Ser 1.30 (*)    Total Bilirubin 2.3 (*)    GFR calc non Af Amer 45 (*)    GFR calc Af Amer 52 (*)    All  other components within normal limits  CBC - Abnormal; Notable for the following:    WBC 10.7 (*)    RBC 3.61 (*)    Hemoglobin 12.9 (*)    HCT 38.5 (*)    MCV 106.6 (*)    MCH 35.8 (*)    RDW 14.8 (*)    All other components within normal limits  PROTIME-INR - Abnormal; Notable for the following:    Prothrombin Time 27.2 (*)    All other components within normal limits  TROPONIN I - Abnormal; Notable for the following:    Troponin I 0.10 (*)    All other components within normal limits  URINALYSIS COMPLETEWITH MICROSCOPIC ([facility] ONLY) - Abnormal; Notable for the following:    Color, Urine YELLOW (*)    APPearance CLEAR (*)    Hgb urine dipstick 2+ (*)    Protein, ur 100 (*)    Squamous Epithelial / LPF 0-5 (*)    All other components within normal limits  BRAIN NATRIURETIC PEPTIDE - Abnormal; Notable for the following:    B Natriuretic Peptide 434.0 (*)    All other components within normal limits  CULTURE, BLOOD (ROUTINE X 2)  CULTURE, BLOOD (ROUTINE X 2)     ____________________________________________   EKG  ED ECG REPORT I, Bevan Disney W, the attending physician, personally viewed and interpreted this ECG.   Date: 11/09/2015  EKG Time: 11:21 AM  Rate: 102  Rhythm:Atrial fibrillation  heart rate of 102. Moderate amount of artifact on EKG   Axis: Normal  Intervals: Normal  ST&T Change: Downward T in lead V3   ____________________________________________    RADIOLOGY  CT head: Pending  Chest x-ray: I personally viewed this chest x-ray and using information during my clinical care of this patient: X-ray shows mild to moderate cardiomegaly with patchiness consistent with pulmonary edema.  Radiology interpretation: FINDINGS: Previous median sternotomy and CABG. Heart size is  normal. Atherosclerosis of the aorta. There are extensive infiltrates in both lower lobes consistent with bronchopneumonia. Upper lungs are clear. No effusions. Chronic elevation of the right hemidiaphragm.  IMPRESSION: Bilateral lower lobe bronchopneumonia.   ____________________________________________   PROCEDURES  ____________________________________________   INITIAL IMPRESSION / ASSESSMENT AND PLAN / ED COURSE  Pertinent labs & imaging results that were available during my care of the patient were reviewed by me and considered in my medical decision making (see chart for details).  Pleasant 82 year old male. He has signs and symptoms of a gastric tract infection, with cough and with discharge from his right eye. We will obtain a chest x-ray. He is on Coumadin. We will check a CT of his head due to the complaint of altered mental status.  INR is 2.56.  ----------------------------------------- 2:55 PM on 11/09/2015 -----------------------------------------  Patient x-ray shows some mild to moderate cardiomegaly with a little bit of pulmonary edema, patchiness, most visible in the left middle area. He has a high riding right diaphragm and the view of the right lung is slightly obscured. I suspect pulmonary edema.   ----------------------------------------- 3:31 PM on 11/09/2015 -----------------------------------------  I discussed the case with Dr. Edwina Barth. He will admit the patient to the emergency department. Radiology has looked at the chest x-ray and has diagnosed bilateral lower lobe bronchopneumonia. Blood cultures are pending. Antibiotics per Dr. Edwina Barth. I discussed the case with the family members, including the daughter on the telephone. They agree with the plan to admit.  ____________________________________________   FINAL CLINICAL IMPRESSION(S) / ED DIAGNOSES  Final diagnoses:  Bronchopneumonia  General weakness  Troponin level elevated      Ahmed Prima, MD 11/09/15 [phone]

## 2015-11-09 NOTE — ED Notes (Signed)
Patient placed on 2L O2 via [place], Dr. Wynetta Emery informed

## 2015-11-09 NOTE — ED Notes (Signed)
Per patient son and caregiver, this morning when patient got up he seemed more confused than normal, confusion got better as the morning went on. They were advised by patients PCP to bring him over to be evaluated.

## 2015-11-09 NOTE — Consult Note (Addendum)
ANTIBIOTIC CONSULT NOTE - INITIAL  Pharmacy Consult for Levofloxacin Indication: pneumonia  Allergies  Allergen Reactions  . Ace Inhibitors Other (See Comments)    Reaction:  Hyperkalemia and renal insufficiency   . Penicillins Hives and Other (See Comments)    Unable to obtain enough information to answer additional questions about this medication.      Patient Measurements: Height: 5\' 8"  (172.7 cm) Weight: 148 lb (67.132 kg) IBW/kg (Calculated) : 68.4  Vital Signs: Temp: 98.3 F (36.8 C) (01/06 1112) Temp Source: Oral (01/06 1112) BP: 148/72 mmHg (01/06 1600) Pulse Rate: 95 (01/06 1600) Intake/Output from previous day:   Intake/Output from this shift:    Labs:  Recent Labs  11/09/15 1118  WBC 10.7*  HGB 12.9*  PLT 204  CREATININE 1.30*   Estimated Creatinine Clearance: 32.3 mL/min (by C-G formula based on Cr of 1.3). No results for input(s): VANCOTROUGH, VANCOPEAK, VANCORANDOM, GENTTROUGH, GENTPEAK, GENTRANDOM, TOBRATROUGH, TOBRAPEAK, TOBRARND, AMIKACINPEAK, AMIKACINTROU, AMIKACIN in the last 72 hours.   Microbiology: No results found for this or any previous visit (from the past 720 hour(s)).  Medical History: Past Medical History  Diagnosis Date  . Hypertension   . Cardiomyopathy   . CAD (coronary artery disease)   . Gout   . Hyperlipidemia   . Mitral regurgitation   . Allergy   . Stroke [facility])     Right putaminal hemorrhage    Medications:  Scheduled:  . amLODipine  5 mg Oral Daily  . calcium carbonate  500 mg Oral Daily  . [START ON 11/10/2015] levofloxacin (LEVAQUIN) IV  500 mg Intravenous Once   Followed by  . [START ON 11/11/2015] levofloxacin (LEVAQUIN) IV  250 mg Intravenous Q24H  . metoprolol succinate  75 mg Oral Daily  . polyethylene glycol  17 g Oral Daily  . simvastatin  20 mg Oral QHS  . tamsulosin  0.4 mg Oral Daily  . warfarin  2.5 mg Oral q1800   Assessment: CI is a 82yo male admitted for CAP. Pharmacy consulted to dose  levofloxacin for this patient.  Plan:  Initiate levofloxacin 500mg  IV x1 followed by levofloxacin 250mg  IV q24hrs to begin 24 hours after azithromycin dose (azithromycin given today 1/6 at 1600. Levofloxacin dose adjusted for renal function.  Patient is also on warfarin at home (therapeutic INR on 4mg  daily), caution recommended with levofloxacin and warfarin co-administration.   Follow up culture results   Pharmacy will continue to monitor.  Vena Rua 11/09/2015,5:00 PM

## 2015-11-09 NOTE — ED Notes (Addendum)
Pt here with caregiver and son, states pt is not acting himself today, states he has had a cough for about the past week.. Pt is a/ox3 on arrival

## 2015-11-10 LAB — CBC
HCT: 34.6 % — ABNORMAL LOW (ref 40.0–52.0)
HEMOGLOBIN: 11.6 g/dL — AB (ref 13.0–18.0)
MCH: 36.3 pg — AB (ref 26.0–34.0)
MCHC: 33.6 g/dL (ref 32.0–36.0)
MCV: 108 fL — AB (ref 80.0–100.0)
Platelets: 193 10*3/uL (ref 150–440)
RBC: 3.2 MIL/uL — AB (ref 4.40–5.90)
RDW: 14.7 % — ABNORMAL HIGH (ref 11.5–14.5)
WBC: 9.3 10*3/uL (ref 3.8–10.6)

## 2015-11-10 LAB — BLOOD CULTURE ID PANEL (REFLEXED)
ACINETOBACTER BAUMANNII: NOT DETECTED
CANDIDA ALBICANS: NOT DETECTED
CANDIDA GLABRATA: NOT DETECTED
CANDIDA PARAPSILOSIS: NOT DETECTED
Candida krusei: NOT DETECTED
Candida tropicalis: NOT DETECTED
Carbapenem resistance: NOT DETECTED
ENTEROBACTER CLOACAE COMPLEX: NOT DETECTED
ENTEROBACTERIACEAE SPECIES: NOT DETECTED
ENTEROCOCCUS SPECIES: NOT DETECTED
ESCHERICHIA COLI: NOT DETECTED
HAEMOPHILUS INFLUENZAE: NOT DETECTED
KLEBSIELLA OXYTOCA: NOT DETECTED
Klebsiella pneumoniae: NOT DETECTED
LISTERIA MONOCYTOGENES: NOT DETECTED
Methicillin resistance: DETECTED — AB
Neisseria meningitidis: NOT DETECTED
Proteus species: NOT DETECTED
Pseudomonas aeruginosa: NOT DETECTED
STAPHYLOCOCCUS SPECIES: DETECTED — AB
STREPTOCOCCUS AGALACTIAE: NOT DETECTED
STREPTOCOCCUS SPECIES: NOT DETECTED
Serratia marcescens: NOT DETECTED
Staphylococcus aureus (BCID): NOT DETECTED
Streptococcus pneumoniae: NOT DETECTED
Streptococcus pyogenes: NOT DETECTED
VANCOMYCIN RESISTANCE: NOT DETECTED

## 2015-11-10 LAB — BASIC METABOLIC PANEL
Anion gap: 6 (ref 5–15)
BUN: 26 mg/dL — ABNORMAL HIGH (ref 6–20)
CHLORIDE: 106 mmol/L (ref 101–111)
CO2: 25 mmol/L (ref 22–32)
CREATININE: 1.08 mg/dL (ref 0.61–1.24)
Calcium: 8.4 mg/dL — ABNORMAL LOW (ref 8.9–10.3)
GFR calc non Af Amer: 56 mL/min — ABNORMAL LOW (ref >60)
GLUCOSE: 122 mg/dL — AB (ref 65–99)
Potassium: 3.4 mmol/L — ABNORMAL LOW (ref 3.5–5.1)
Sodium: 137 mmol/L (ref 135–145)

## 2015-11-10 LAB — TROPONIN I: Troponin I: 0.08 ng/mL — ABNORMAL HIGH (ref <0.031)

## 2015-11-10 LAB — PROTIME-INR
INR: 3.05
Prothrombin Time: 31 seconds — ABNORMAL HIGH (ref 11.4–15.0)

## 2015-11-10 MED ORDER — VANCOMYCIN HCL IN DEXTROSE 1-5 GM/200ML-% IV SOLN
1000.0000 mg | INTRAVENOUS | Status: DC
  Administered 2015-11-10 – 2015-11-11 (×2): 1000 mg via INTRAVENOUS
  Filled 2015-11-10 (×3): qty 200

## 2015-11-10 MED ORDER — VANCOMYCIN HCL IN DEXTROSE 1-5 GM/200ML-% IV SOLN
1000.0000 mg | Freq: Once | INTRAVENOUS | Status: AC
  Administered 2015-11-10: 1000 mg via INTRAVENOUS
  Filled 2015-11-10: qty 200

## 2015-11-10 MED ORDER — POTASSIUM CHLORIDE CRYS ER 20 MEQ PO TBCR
40.0000 meq | EXTENDED_RELEASE_TABLET | Freq: Once | ORAL | Status: AC
  Administered 2015-11-10: 40 meq via ORAL
  Filled 2015-11-10: qty 2

## 2015-11-10 MED ORDER — IPRATROPIUM-ALBUTEROL 0.5-2.5 (3) MG/3ML IN SOLN
3.0000 mL | RESPIRATORY_TRACT | Status: DC
  Administered 2015-11-10 – 2015-11-11 (×4): 3 mL via RESPIRATORY_TRACT
  Filled 2015-11-10 (×4): qty 3

## 2015-11-10 NOTE — Progress Notes (Signed)
[place] [facility] [place] NAME: Charles Tapia    MR#:  [id number]  DATE OF BIRTH:  09-14-1918  SUBJECTIVE: Admitted for acute respiratory failure with hypoxia due to pneumonia. A lot of cough and phlegm. And also had confusion according to the H&P.  Today he is more alert awake oriented. Complains of tightness in the chest, cough. Seen   CHIEF COMPLAINT:   Chief Complaint  Patient presents with  . Altered Mental Status    REVIEW OF SYSTEMS:   ROS CONSTITUTIONAL: No fever, fatigue or weakness.  EYES: No blurred or double vision.  EARS, NOSE, AND THROAT: No tinnitus or ear pain.  RESPIRATORY: Cough, shortness of breath, wheezing.  CARDIOVASCULAR: No chest pain, orthopnea, edema.  GASTROINTESTINAL: No nausea, vomiting, diarrhea or abdominal pain.  GENITOURINARY: No dysuria, hematuria.  ENDOCRINE: No polyuria, nocturia,  HEMATOLOGY: No anemia, easy bruising or bleeding SKIN: No rash or lesion. MUSCULOSKELETAL: No joint pain or arthritis.   NEUROLOGIC: No tingling, numbness, weakness.  PSYCHIATRY: No anxiety or depression.   DRUG ALLERGIES:   Allergies  Allergen Reactions  . Ace Inhibitors Other (See Comments)    Reaction:  Hyperkalemia and renal insufficiency   . Penicillins Hives and Other (See Comments)    Unable to obtain enough information to answer additional questions about this medication.      VITALS:  Blood pressure 133/71, pulse 82, temperature 98.2 F (36.8 C), temperature source Oral, resp. rate 24, height 5\' 8"  (1.727 m), weight 66.996 kg (147 lb 11.2 oz), SpO2 94 %.  PHYSICAL EXAMINATION:  GENERAL:  82 y.o.-year-old patient lying in the bed with no acute distress.  EYES: Pupils equal, round, reactive to light and accommodation. No scleral icterus. Extraocular muscles intact.  HEENT: Head atraumatic, normocephalic. Oropharynx and nasopharynx clear.  NECK:  Supple, no jugular venous distention. No thyroid  enlargement, no tenderness.  LUNGS: Faint bilateral wheezing in all lung fields, rales,rhonchi or crepitation. No use of accessory muscles of respiration.  CARDIOVASCULAR: S1, S2 normal. No murmurs, rubs, or gallops.  ABDOMEN: Soft, nontender, nondistended. Bowel sounds present. No organomegaly or mass.  EXTREMITIES: No pedal edema, cyanosis, or clubbing.  NEUROLOGIC: Cranial nerves II through XII are intact. Muscle strength 5/5 in all extremities. Sensation intact. Gait not checked.  PSYCHIATRIC: The patient is alert and oriented x 3.  SKIN: No obvious rash, lesion, or ulcer.    LABORATORY PANEL:   CBC  Recent Labs Lab 11/10/15 0415  WBC 9.3  HGB 11.6*  HCT 34.6*  PLT 193   ------------------------------------------------------------------------------------------------------------------  Chemistries   Recent Labs Lab 11/09/15 1118 11/10/15 0415  NA 136 137  K 3.6 3.4*  CL 101 106  CO2 26 25  GLUCOSE 213* 122*  BUN 27* 26*  CREATININE 1.30* 1.08  CALCIUM 9.1 8.4*  AST 25  --   ALT 19  --   ALKPHOS 91  --   BILITOT 2.3*  --    ------------------------------------------------------------------------------------------------------------------  Cardiac Enzymes  Recent Labs Lab 11/10/15 0415  TROPONINI 0.08*   ------------------------------------------------------------------------------------------------------------------  RADIOLOGY:  Dg Chest 1 View  11/09/2015  CLINICAL DATA:  Cough, 1 week duration. EXAM: CHEST 1 VIEW COMPARISON:  10/08/2012 FINDINGS: Previous median sternotomy and CABG. Heart size is normal. Atherosclerosis of the aorta. There are extensive infiltrates in both lower lobes consistent with bronchopneumonia. Upper lungs are clear. No effusions. Chronic elevation of the right hemidiaphragm. IMPRESSION: Bilateral lower lobe bronchopneumonia. Electronically Signed   By: Elta Guadeloupe  Shogry M.D.   On: 11/09/2015 15:06   Ct Head Wo Contrast  11/09/2015   CLINICAL DATA:  Patient with cough and altered mental status. EXAM: CT HEAD WITHOUT CONTRAST TECHNIQUE: Contiguous axial images were obtained from the base of the skull through the vertex without intravenous contrast. COMPARISON:  Brain CT 10/09/2012. FINDINGS: Ventricles and sulci are prominent compatible with atrophy. No evidence for acute cortically based infarct, intracranial hemorrhage, mass lesion or mass-effect. Orbits are unremarkable. Air-fluid levels demonstrated within frontal sinus, ethmoid air cells bilateral maxillary sinuses. Mastoid air cells are unremarkable. Calvarium is intact. IMPRESSION: No acute intracranial process. Cortical atrophy. Air-fluid levels within the paranasal sinuses most compatible with acute sinusitis. Electronically Signed   By: Lovey Newcomer M.D.   On: 11/09/2015 15:29    EKG:   Orders placed or performed during the hospital encounter of 11/09/15  . EKG 12-Lead  . EKG 12-Lead    ASSESSMENT AND PLAN:   #1 acute hypoxia secondary to pneumonia: Patient  Is on  Vanco and azithromycin. Does have some wheezing now so we will start nebulizers. Acute renal failure with ATN due to dehydration: Improved with IV hydration. Discontinue IV fluids at this time. #3 elevated troponins secondary to demand ischemia. Troponins did not go up.. Patient already on Coumadin and continue that. #4. Atrial fibrillation: Rate controlled, continue Coumadin, metoprolol. 5 BPH continue Flomax.  #6 hypertension ; controlled continue amlodipine, metoprolol.   All the records are reviewed and case discussed with Care Management/Social Workerr. Management plans discussed with the patient, family and they are in agreement.  CODE STATUS: full  TOTAL TIME TAKING CARE OF THIS PATIENT: 35inutes.   POSSIBLE D/C IN 1-2DAYS, DEPENDING ON CLINICAL CONDITION.   Epifanio Lesches M.D on 11/10/2015 at 10:16 AM  Between 7am to 6pm - Pager - [phone]  After 6pm go to [email or web address] -  password [email or web address] [facility]  Office  [phone]  CC: Primary care physician; [place] Pink, MD   Note: This dictation was prepared with Dragon dictation along with smaller phrase technology. Any transcriptional errors that result from this process are unintentional.

## 2015-11-10 NOTE — Progress Notes (Signed)
Informed Dr. pyreddy that potassium level was 3.4. New order: 40 MEQ potassium once.

## 2015-11-10 NOTE — Progress Notes (Addendum)
Dr. Vianne Bulls notified that patients blood cultures came back positive for MRSA orders received to place patient on contact isolation

## 2015-11-10 NOTE — Consult Note (Signed)
ANTIBIOTIC CONSULT NOTE - INITIAL  Pharmacy Consult for Vancomycin and Levofloxacin Indication: bacteremia and pneumonia  Allergies  Allergen Reactions  . Ace Inhibitors Other (See Comments)    Reaction:  Hyperkalemia and renal insufficiency   . Penicillins Hives and Other (See Comments)    Unable to obtain enough information to answer additional questions about this medication.      Patient Measurements: Height: 5\' 8"  (172.7 cm) Weight: 147 lb 11.2 oz (66.996 kg) IBW/kg (Calculated) : 68.4  Vital Signs: Temp: 98.5 F (36.9 C) (01/07 1110) Temp Source: Oral (01/07 1110) BP: 116/70 mmHg (01/07 1110) Pulse Rate: 84 (01/07 1110) Intake/Output from previous day: 01/06 0701 - 01/07 0700 In: 780 [P.O.:240; I.V.:540] Out: 0  Intake/Output from this shift: Total I/O In: 120 [P.O.:120] Out: 300 [Urine:300]  Recent Labs  11/09/15 1118 11/10/15 0415  WBC 10.7* 9.3  HGB 12.9* 11.6*  PLT 204 193  CREATININE 1.30* 1.08   Estimated Creatinine Clearance: 38.8 mL/min (by C-G formula based on Cr of 1.08). No results for input(s): VANCOTROUGH, VANCOPEAK, VANCORANDOM, GENTTROUGH, GENTPEAK, GENTRANDOM, TOBRATROUGH, TOBRAPEAK, TOBRARND, AMIKACINPEAK, AMIKACINTROU, AMIKACIN in the last 72 hours.   Microbiology: Recent Results (from the past 720 hour(s))  Culture, blood (routine x 2)     Status: None (Preliminary result)   Collection Time: 11/09/15  3:01 PM  Result Value Ref Range Status   Specimen Description BLOOD LEFT ARM  Final   Special Requests BOTTLES DRAWN AEROBIC AND ANAEROBIC 4CCAERO,3CCANA  Final   Culture  Setup Time   Final    GRAM POSITIVE COCCI AEROBIC BOTTLE ONLY CRITICAL VALUE NOTED.  VALUE IS CONSISTENT WITH PREVIOUSLY REPORTED AND CALLED VALUE.    Culture GRAM POSITIVE COCCI IDENTIFICATION TO FOLLOW   Final   Report Status PENDING  Incomplete  Culture, blood (routine x 2)     Status: None (Preliminary result)   Collection Time: 11/09/15  3:30 PM  Result Value  Ref Range Status   Specimen Description BLOOD LEFT ASSIST CONTROL  Final   Special Requests BOTTLES DRAWN AEROBIC AND ANAEROBIC 3CCAERO,2CCANA  Final   Culture  Setup Time   Final    GRAM POSITIVE COCCI IN BOTH AEROBIC AND ANAEROBIC BOTTLES CRITICAL RESULT CALLED TO, READ BACK BY AND VERIFIED WITH: Ronda Rajkumar AT [id number] 11/10/15 DV    Culture GRAM POSITIVE COCCI IDENTIFICATION TO FOLLOW   Final   Report Status PENDING  Incomplete  Blood Culture ID Panel (Reflexed)     Status: Abnormal   Collection Time: 11/09/15  3:30 PM  Result Value Ref Range Status   Enterococcus species NOT DETECTED NOT DETECTED Final   Listeria monocytogenes NOT DETECTED NOT DETECTED Final   Staphylococcus species DETECTED (A) NOT DETECTED Final    Comment: CRITICAL RESULT CALLED TO, READ BACK BY AND VERIFIED WITH: Terrill Alperin AT [id number] 11/10/15 DV REPORTED STAPH SPECIES AND MECA    Staphylococcus aureus NOT DETECTED NOT DETECTED Final   Streptococcus species NOT DETECTED NOT DETECTED Final   Streptococcus agalactiae NOT DETECTED NOT DETECTED Final   Streptococcus pneumoniae NOT DETECTED NOT DETECTED Final   Streptococcus pyogenes NOT DETECTED NOT DETECTED Final   Acinetobacter baumannii NOT DETECTED NOT DETECTED Final   Enterobacteriaceae species NOT DETECTED NOT DETECTED Final   Enterobacter cloacae complex NOT DETECTED NOT DETECTED Final   Escherichia coli NOT DETECTED NOT DETECTED Final   Klebsiella oxytoca NOT DETECTED NOT DETECTED Final   Klebsiella pneumoniae NOT DETECTED NOT DETECTED Final   Proteus species NOT DETECTED  NOT DETECTED Final   Serratia marcescens NOT DETECTED NOT DETECTED Final   Haemophilus influenzae NOT DETECTED NOT DETECTED Final   Neisseria meningitidis NOT DETECTED NOT DETECTED Final   Pseudomonas aeruginosa NOT DETECTED NOT DETECTED Final   Candida albicans NOT DETECTED NOT DETECTED Final   Candida glabrata NOT DETECTED NOT DETECTED Final   Candida krusei NOT DETECTED NOT  DETECTED Final   Candida parapsilosis NOT DETECTED NOT DETECTED Final   Candida tropicalis NOT DETECTED NOT DETECTED Final   Carbapenem resistance NOT DETECTED NOT DETECTED Final   Methicillin resistance DETECTED (A) NOT DETECTED Final   Vancomycin resistance NOT DETECTED NOT DETECTED Final    Medical History: Past Medical History  Diagnosis Date  . Hypertension   . Cardiomyopathy   . CAD (coronary artery disease)   . Gout   . Hyperlipidemia   . Mitral regurgitation   . Allergy   . Stroke [facility])     Right putaminal hemorrhage    Assessment: 82yo male admitted for CAP. Lab contacted pharmacy with positive Biofire results on 1/7 @ 11:41.  BCx X2 bottles showing staph species Mec A positive Pharmacy consulted for dosing and monitoring of Vancomycin for bacteremia.   Pharmacy consulted to dose levofloxacin on 1/6 for CAP.  CrCl: 38     Wt: 67kg     Scr: 1.1  Plan:  Ke: 0.036    Vd: 47     T1/2: 19.3  Will start patient on Vancomycin 1g IV q24 hours with 7 hours stack dosing. Calculated trough at Css 17.6. Will order trough level prior to 4th dose   Will continue levofloxacin 500mg  IV x1 followed by levofloxacin 250mg  IV q24hrs (azithromycin given today 1/6 at 1600). Levofloxacin dose adjusted for renal function and started 1/7.   Pharmacy will continue to follow culture results and monitor labs and renal function. Adjustments will be made as needed.  Imer Foxworth M Kiante Petrovich 11/10/2015,11:58 AM

## 2015-11-10 NOTE — Progress Notes (Signed)
Pharmacy Antibiotic Follow-up Note  Charles Tapia is a 82 y.o. year-old male admitted on 11/09/2015.  The patient is currently on day 1 of Levaquin  for Pneumonia. Lab called with BioFire results on 1/7 @ 11:40: Staph species MecA positive in both aerobic and anaerobic bottles.   Assessment/Plan: After discussion with Dr. Vianne Bulls, patient will be started on Vancomycin for bacteremia. Pharmacy has been consulted for dosing and monitoring   Temp (24hrs), Avg:99 F (37.2 C), Min:98.2 F (36.8 C), Max:100.7 F (38.2 C)   Recent Labs Lab 11/09/15 1118 11/10/15 0415  WBC 10.7* 9.3    Recent Labs Lab 11/09/15 1118 11/10/15 0415  CREATININE 1.30* 1.08   Estimated Creatinine Clearance: 38.8 mL/min (by C-G formula based on Cr of 1.08).    Allergies  Allergen Reactions  . Ace Inhibitors Other (See Comments)    Reaction:  Hyperkalemia and renal insufficiency   . Penicillins Hives and Other (See Comments)    Unable to obtain enough information to answer additional questions about this medication.      Antimicrobials this admission: 1/6 >> Azithromycin X1 1/7 >> Levofloxacin  1/7 >> Vancomycin   Microbiology results: 1/7 BCx: X2 Staph species MecA positive   Thank you for allowing pharmacy to be a part of this patient's care.  Pernell Dupre PharmD 11/10/2015 11:52 AM

## 2015-11-11 LAB — CREATININE, SERUM
CREATININE: 1.1 mg/dL (ref 0.61–1.24)
GFR, EST NON AFRICAN AMERICAN: 55 mL/min — AB (ref >60)

## 2015-11-11 LAB — CBC
HEMATOCRIT: 33.6 % — AB (ref 40.0–52.0)
HEMOGLOBIN: 11.1 g/dL — AB (ref 13.0–18.0)
MCH: 35.8 pg — AB (ref 26.0–34.0)
MCHC: 33.1 g/dL (ref 32.0–36.0)
MCV: 108.2 fL — ABNORMAL HIGH (ref 80.0–100.0)
Platelets: 197 10*3/uL (ref 150–440)
RBC: 3.11 MIL/uL — AB (ref 4.40–5.90)
RDW: 14.6 % — ABNORMAL HIGH (ref 11.5–14.5)
WBC: 10.1 10*3/uL (ref 3.8–10.6)

## 2015-11-11 LAB — PROTIME-INR
INR: 3.16
PROTHROMBIN TIME: 31.8 s — AB (ref 11.4–15.0)

## 2015-11-11 MED ORDER — WARFARIN SODIUM 1 MG PO TABS
1.5000 mg | ORAL_TABLET | Freq: Once | ORAL | Status: AC
  Administered 2015-11-11: 1.5 mg via ORAL
  Filled 2015-11-11: qty 1

## 2015-11-11 MED ORDER — IPRATROPIUM-ALBUTEROL 0.5-2.5 (3) MG/3ML IN SOLN
3.0000 mL | Freq: Four times a day (QID) | RESPIRATORY_TRACT | Status: DC
  Administered 2015-11-11 – 2015-11-12 (×7): 3 mL via RESPIRATORY_TRACT
  Filled 2015-11-11 (×7): qty 3

## 2015-11-11 MED ORDER — WARFARIN - PHARMACIST DOSING INPATIENT
Freq: Every day | Status: DC
  Administered 2015-11-11 – 2015-11-12 (×2)

## 2015-11-11 MED ORDER — WARFARIN SODIUM 2.5 MG PO TABS: 2.5000 mg | ORAL_TABLET | Freq: Every day | ORAL | Status: DC

## 2015-11-11 NOTE — Progress Notes (Addendum)
ANTICOAGULATION CONSULT NOTE - Initial Consult  Pharmacy Consult for [place]  Indication: atrial fibrillation  Allergies  Allergen Reactions  . Ace Inhibitors Other (See Comments)    Reaction:  Hyperkalemia and renal insufficiency   . Penicillins Hives and Other (See Comments)    Unable to obtain enough information to answer additional questions about this medication.      Patient Measurements: Height: 5\' 8"  (172.7 cm) Weight: 147 lb 11.2 oz (66.996 kg) IBW/kg (Calculated) : 68.4 Heparin Dosing Weight:   Vital Signs: Temp: 98 F (36.7 C) (01/08 1148) Temp Source: Oral (01/08 1148) BP: 147/81 mmHg (01/08 1148) Pulse Rate: 95 (01/08 1148)  Labs:  Recent Labs  11/09/15 1118 11/09/15 1719 11/09/15 2217 11/10/15 0415 11/11/15 0530  HGB 12.9*  --   --  11.6* 11.1*  HCT 38.5*  --   --  34.6* 33.6*  PLT 204  --   --  193 197  LABPROT 27.2* 30.6*  --  31.0* 31.8*  INR 2.56 3.00  --  3.05 3.16  CREATININE 1.30*  --   --  1.08 1.10  TROPONINI 0.10* <0.03 <0.03 0.08*  --     Estimated Creatinine Clearance: 38.1 mL/min (by C-G formula based on Cr of 1.1).   Medical History: Past Medical History  Diagnosis Date  . Hypertension   . Cardiomyopathy   . CAD (coronary artery disease)   . Gout   . Hyperlipidemia   . Mitral regurgitation   . Allergy   . Stroke [facility])     Right putaminal hemorrhage    Medications:  Prescriptions prior to admission  Medication Sig Dispense Refill Last Dose  . allopurinol (ZYLOPRIM) 300 MG tablet Take 300 mg by mouth daily.   11/09/2015 at Unknown time  . amLODipine (NORVASC) 5 MG tablet Take 5 mg by mouth daily.   11/09/2015 at Unknown time  . calcium carbonate (OS-CAL) 600 MG TABS Take 600 mg by mouth daily.   11/09/2015 at Unknown time  . metoprolol succinate (TOPROL-XL) 50 MG 24 hr tablet Take 75 mg by mouth daily.    11/09/2015 at 0900  . polyethylene glycol (MIRALAX / GLYCOLAX) packet Take 17 g by mouth daily as needed for mild constipation.     PRN at PRN  . simvastatin (ZOCOR) 20 MG tablet Take 20 mg by mouth at bedtime.    11/08/2015 at Unknown time  . tamsulosin (FLOMAX) 0.4 MG CAPS capsule Take 0.4 mg by mouth daily.   11/09/2015 at Unknown time  . warfarin (COUMADIN) 4 MG tablet Take 4 mg by mouth daily.   11/09/2015 at 0900  . acetaminophen (TYLENOL) 500 MG tablet Take 1,000 mg by mouth every 6 (six) hours as needed for mild pain.    PRN at PRN   Scheduled:  . amLODipine  5 mg Oral Daily  . calcium carbonate  500 mg Oral Daily  . ipratropium-albuterol  3 mL Nebulization Q6H WA  . levofloxacin (LEVAQUIN) IV  250 mg Intravenous Q24H  . metoprolol succinate  75 mg Oral Daily  . polyethylene glycol  17 g Oral Daily  . simvastatin  20 mg Oral QHS  . tamsulosin  0.4 mg Oral Daily  . vancomycin  1,000 mg Intravenous Q24H  . warfarin  2.5 mg Oral q1800    Assessment: Pharmacy consulted to assist with dosing and monitoring of warfarin therapy in this 82 year old woman for atrial fibrillation.  Potential DDI: Levofloxacin  Goal of Therapy:  INR 2-3  Monitor platelets by anticoagulation protocol: Yes   Plan:  Patient's home dose is warfarin 4 mg Po daily. Patient has been on 2.5 mg daily. INR is slightly elevated, will reduce today's dose to 1.5 mg and will then continue with 2.5 mg Po daily. Pharmacy to follow and adjust per consult.   Mekenna Finau D 11/11/2015,1:40 PM

## 2015-11-11 NOTE — Progress Notes (Signed)
Patient is alert and oriented and able to make his needs known to staff.  The RN was called to the room by the nurse tech that the patient was having SOB when she was assisting him to use his urinal. The RN went to the patient's  room and assessed him.  The patient was receiving breathing treatment from the RRT when the nurse got there. Patient's 02 sat was 93% on 2L.  No acute distress or any s/s of pain noted on assessment. Patient verbalised relief after receiving the breathing treatment. The patient is now resting in bed with his eyes closed, respirations even and unlabored. Will continue to monitor.

## 2015-11-11 NOTE — Progress Notes (Signed)
[facility] [place] NAME: Charles Tapia    MR#:  [id number]  DATE OF BIRTH:  23-Feb-1918  SUBJECTIVE: Admitted for acute respiratory failure with hypoxia due to pneumonia. A lot of cough and phlegm. And also had confusion according to the H&P.  Patient feels better today. Blood cultures positive for staph. So started on IV vancomycin, placed on contact isolation. Less cough today. Needing 2 L of oxygen.   CHIEF COMPLAINT:   Chief Complaint  Patient presents with  . Altered Mental Status    REVIEW OF SYSTEMS:   ROS CONSTITUTIONAL: No fever, fatigue or weakness.  EYES: No blurred or double vision.  EARS, NOSE, AND THROAT: No tinnitus or ear pain.  RESPIRATORY: les  cough, less shortness of breath, less wheezing. CARDIOVASCULAR: No chest pain, orthopnea, edema.  GASTROINTESTINAL: No nausea, vomiting, diarrhea or abdominal pain.  GENITOURINARY: No dysuria, hematuria.  ENDOCRINE: No polyuria, nocturia,  HEMATOLOGY: No anemia, easy bruising or bleeding SKIN: No rash or lesion. MUSCULOSKELETAL: No joint pain or arthritis.   NEUROLOGIC: No tingling, numbness, weakness.  PSYCHIATRY: No anxiety or depression.   DRUG ALLERGIES:   Allergies  Allergen Reactions  . Ace Inhibitors Other (See Comments)    Reaction:  Hyperkalemia and renal insufficiency   . Penicillins Hives and Other (See Comments)    Unable to obtain enough information to answer additional questions about this medication.      VITALS:  Blood pressure 127/62, pulse 107, temperature 99.2 F (37.3 C), temperature source Oral, resp. rate 18, height 5\' 8"  (1.727 m), weight 66.996 kg (147 lb 11.2 oz), SpO2 93 %.  PHYSICAL EXAMINATION:  GENERAL:  82 y.o.-year-old patient lying in the bed with no acute distress.  EYES: Pupils equal, round, reactive to light and accommodation. No scleral icterus. Extraocular muscles intact.  HEENT: Head atraumatic, normocephalic. Oropharynx and  nasopharynx clear.  NECK:  Supple, no jugular venous distention. No thyroid enlargement, no tenderness.  LUNGS: Clear to auscultation, no wheezing, no rales, not using accessory muscles of respiration. CARDIOVASCULAR: S1, S2 normal. No murmurs, rubs, or gallops.  ABDOMEN: Soft, nontender, nondistended. Bowel sounds present. No organomegaly or mass.  EXTREMITIES: No pedal edema, cyanosis, or clubbing.  NEUROLOGIC: Cranial nerves II through XII are intact. Muscle strength 5/5 in all extremities. Sensation intact. Gait not checked.  PSYCHIATRIC: The patient is alert and oriented x 3.  SKIN: No obvious rash, lesion, or ulcer.    LABORATORY PANEL:   CBC  Recent Labs Lab 11/11/15 0530  WBC 10.1  HGB 11.1*  HCT 33.6*  PLT 197   ------------------------------------------------------------------------------------------------------------------  Chemistries   Recent Labs Lab 11/09/15 1118 11/10/15 0415 11/11/15 0530  NA 136 137  --   K 3.6 3.4*  --   CL 101 106  --   CO2 26 25  --   GLUCOSE 213* 122*  --   BUN 27* 26*  --   CREATININE 1.30* 1.08 1.10  CALCIUM 9.1 8.4*  --   AST 25  --   --   ALT 19  --   --   ALKPHOS 91  --   --   BILITOT 2.3*  --   --    ------------------------------------------------------------------------------------------------------------------  Cardiac Enzymes  Recent Labs Lab 11/10/15 0415  TROPONINI 0.08*   ------------------------------------------------------------------------------------------------------------------  RADIOLOGY:  Dg Chest 1 View  11/09/2015  CLINICAL DATA:  Cough, 1 week duration. EXAM: CHEST 1 VIEW COMPARISON:  10/08/2012 FINDINGS: Previous median  sternotomy and CABG. Heart size is normal. Atherosclerosis of the aorta. There are extensive infiltrates in both lower lobes consistent with bronchopneumonia. Upper lungs are clear. No effusions. Chronic elevation of the right hemidiaphragm. IMPRESSION: Bilateral lower lobe  bronchopneumonia. Electronically Signed   By: Nelson Chimes M.D.   On: 11/09/2015 15:06   Ct Head Wo Contrast  11/09/2015  CLINICAL DATA:  Patient with cough and altered mental status. EXAM: CT HEAD WITHOUT CONTRAST TECHNIQUE: Contiguous axial images were obtained from the base of the skull through the vertex without intravenous contrast. COMPARISON:  Brain CT 10/09/2012. FINDINGS: Ventricles and sulci are prominent compatible with atrophy. No evidence for acute cortically based infarct, intracranial hemorrhage, mass lesion or mass-effect. Orbits are unremarkable. Air-fluid levels demonstrated within frontal sinus, ethmoid air cells bilateral maxillary sinuses. Mastoid air cells are unremarkable. Calvarium is intact. IMPRESSION: No acute intracranial process. Cortical atrophy. Air-fluid levels within the paranasal sinuses most compatible with acute sinusitis. Electronically Signed   By: Lovey Newcomer M.D.   On: 11/09/2015 15:29    EKG:   Orders placed or performed during the hospital encounter of 11/09/15  . EKG 12-Lead  . EKG 12-Lead    ASSESSMENT AND PLAN:   #1 acute hypoxia secondary to pneumonia: Patient  Is on  Vanco and  levaquin. Continue duoneb.o2.clinically improving slowly.. Gram  positive bacteremia: Likely secondary to pneumonia: Continue vancomycin, follow full culture data. Acute renal failure with ATN due to dehydration: Improved with IV hydration. Discontinued IV fluids  . #3 elevated troponins secondary to demand ischemia. Troponins did not go up.. Patient already on Coumadin and continue that. #4. Atrial fibrillation: slightly  Tachycardic, continue Coumadin, metoprolol. 5 BPH continue Flomax. .  #6 hypertension ; controlled continue amlodipine, metoprolol. 7. Deconditioning: Physical therapy consult.  All the records are reviewed and case discussed with Care Management/Social Workerr. Management plans discussed with the patient, family and they are in agreement.  CODE  STATUS: full  TOTAL TIME TAKING CARE OF THIS PATIENT: 35inutes.   POSSIBLE D/C IN 1-2DAYS, DEPENDING ON CLINICAL CONDITION.   Epifanio Lesches M.D on 11/11/2015 at 10:34 AM  Between 7am to 6pm - Pager - [phone]  After 6pm go to [email or web address] - password [email or web address] [facility]  Office  [phone]  CC: Primary care physician; [place] Pink, MD   Note: This dictation was prepared with Dragon dictation along with smaller phrase technology. Any transcriptional errors that result from this process are unintentional.

## 2015-11-11 NOTE — Evaluation (Signed)
Physical Therapy Evaluation Patient Details Name: Charles Tapia MRN: [id number] DOB: 05-Dec-1917 Today's Date: 11/11/2015   History of Present Illness  82 y/o male here withAMS, respiratory failure secondary to PNA.  He does not use O2 at home.  Clinical Impression  Pt very eager to participate with PT and generally does well.  He has a drop in O2 walking on room air but does not feel overly fatigued and he quickly increases to the mid 90s on 2 liters.  Pt walks slowly, but safely with both walker and with R HHA.  Pt will continue to need home aide and consistent family support but is not too far off his baseline and should be safe going home.    Follow Up Recommendations Home health PT (pt will continue to need consistent supervision)    Equipment Recommendations       Recommendations for Other Services       Precautions / Restrictions Precautions Precautions: Fall Restrictions Weight Bearing Restrictions: No      Mobility  Bed Mobility Overal bed mobility: Modified Independent             General bed mobility comments: Pt is slow with getting to EOB, but is able to get to sitting with rail use and no direct assist  Transfers Overall transfer level: Modified independent Equipment used: Rolling walker (2 wheeled)             General transfer comment: Pt needs cues for hand placement and safe sequencing. Pt wanting to get up to standing w/o AD and needs safety cues.    Ambulation/Gait Ambulation/Gait assistance: Min guard Ambulation Distance (Feet): 35 Feet Assistive device: Rolling walker (2 wheeled)       General Gait Details: Pt with slow shuffling gait that family reports is near his baseline.  He shows good effort but does fatigue during 2 bouts of 35 ft of ambulation (one with walker, one with single HHA) Both on room air, O2 drops from 95% on 2 liters to 89-91% after ambualtion.  Stairs            Wheelchair Mobility    Modified Rankin (Stroke  Patients Only)       Balance                                             Pertinent Vitals/Pain Pain Assessment:  (has chronic arthritic pain, not more than normal now)    Home Living Family/patient expects to be discharged to:: Private residence Living Arrangements: Alone Available Help at Discharge: Personal care attendant;Family   Home Access: Ramped entrance              Prior Function Level of Independence: Independent with assistive device(s)         Comments: Pt has 4WW, 3WW and cane, uses cane most often     Hand Dominance        Extremity/Trunk Assessment   Upper Extremity Assessment: Generalized weakness (unable to elevate over head, grossly 3+ to 4-/5 t/o)           Lower Extremity Assessment: Generalized weakness (R LE grossly 3+/5, L 4-/5)         Communication   Communication: No difficulties  Cognition Arousal/Alertness: Awake/alert Behavior During Therapy: WFL for tasks assessed/performed Overall Cognitive Status: Within Functional Limits for tasks assessed  General Comments      Exercises        Assessment/Plan    PT Assessment Patient needs continued PT services  PT Diagnosis Difficulty walking;Generalized weakness   PT Problem List Decreased strength;Decreased balance;Decreased mobility;Decreased activity tolerance;Decreased range of motion;Decreased knowledge of use of DME;Decreased safety awareness  PT Treatment Interventions DME instruction;Gait training;Functional mobility training;Stair training;Therapeutic activities;Therapeutic exercise;Balance training;Neuromuscular re-education;Patient/family education   PT Goals (Current goals can be found in the Care Plan section) Acute Rehab PT Goals Patient Stated Goal: "I would really like to go home" PT Goal Formulation: With patient Time For Goal Achievement: 11/11/15 Potential to Achieve Goals: Good    Frequency Min  2X/week   Barriers to discharge        Co-evaluation               End of Session Equipment Utilized During Treatment: Gait belt Activity Tolerance: Patient tolerated treatment well;Patient limited by fatigue Patient left: with bed alarm set;with call bell/phone within reach Nurse Communication: Mobility status         Time: 1428-1450 PT Time Calculation (min) (ACUTE ONLY): 22 min   Charges:   PT Evaluation $PT Eval Low Complexity: 1 Procedure     PT G Codes:       Wayne Both, PT, DPT [phone]  [name] 11/11/2015, 4:58 PM

## 2015-11-12 LAB — PROTIME-INR
INR: 3.22
PROTHROMBIN TIME: 32.3 s — AB (ref 11.4–15.0)

## 2015-11-12 LAB — CBC
HCT: 34.3 % — ABNORMAL LOW (ref 40.0–52.0)
HEMOGLOBIN: 11.4 g/dL — AB (ref 13.0–18.0)
MCH: 35.1 pg — ABNORMAL HIGH (ref 26.0–34.0)
MCHC: 33.2 g/dL (ref 32.0–36.0)
MCV: 105.8 fL — ABNORMAL HIGH (ref 80.0–100.0)
Platelets: 232 10*3/uL (ref 150–440)
RBC: 3.24 MIL/uL — AB (ref 4.40–5.90)
RDW: 14.5 % (ref 11.5–14.5)
WBC: 9.2 10*3/uL (ref 3.8–10.6)

## 2015-11-12 MED ORDER — METOPROLOL SUCCINATE ER 100 MG PO TB24
100.0000 mg | ORAL_TABLET | Freq: Every day | ORAL | Status: DC
  Administered 2015-11-13: 100 mg via ORAL
  Filled 2015-11-12: qty 1

## 2015-11-12 MED ORDER — ENSURE ENLIVE PO LIQD
237.0000 mL | Freq: Three times a day (TID) | ORAL | Status: DC
  Administered 2015-11-12 – 2015-11-13 (×4): 237 mL via ORAL

## 2015-11-12 NOTE — Progress Notes (Signed)
[facility] [place] NAME: Charles Tapia    MR#:  [id number]  DATE OF BIRTH:  [birth year]  SUBJECTIVE: Admitted for acute respiratory failure with hypoxia due to pneumonia. A lot of cough and phlegm. And also had confusion according to the H&P.   Tachycardic today. But no other complaints. Blood cultures are contaminant. So discontinued isolation. Discussed the case with patient's daughter at bedside.   CHIEF COMPLAINT:   Chief Complaint  Patient presents with  . Altered Mental Status    REVIEW OF SYSTEMS:   ROS CONSTITUTIONAL: No fever, fatigue or weakness.  EYES: No blurred or double vision.  EARS, NOSE, AND THROAT: No tinnitus or ear pain.  RESPIRATORY: les  cough, less shortness of breath, less wheezing. CARDIOVASCULAR: No chest pain, orthopnea, edema.  GASTROINTESTINAL: No nausea, vomiting, diarrhea or abdominal pain.  GENITOURINARY: No dysuria, hematuria.  ENDOCRINE: No polyuria, nocturia,  HEMATOLOGY: No anemia, easy bruising or bleeding SKIN: No rash or lesion. MUSCULOSKELETAL: No joint pain or arthritis.   NEUROLOGIC: No tingling, numbness, weakness.  PSYCHIATRY: No anxiety or depression.   DRUG ALLERGIES:   Allergies  Allergen Reactions  . Ace Inhibitors Other (See Comments)    Reaction:  Hyperkalemia and renal insufficiency   . Penicillins Hives and Other (See Comments)    Unable to obtain enough information to answer additional questions about this medication.      VITALS:  Blood pressure 128/76, pulse 118, temperature 98.2 F (36.8 C), temperature source Oral, resp. rate 19, height 5\' 8"  (1.727 m), weight 66.996 kg (147 lb 11.2 oz), SpO2 94 %.  PHYSICAL EXAMINATION:  GENERAL:  82 y.o.-year-old patient lying in the bed with no acute distress.  EYES: Pupils equal, round, reactive to light and accommodation. No scleral icterus. Extraocular muscles intact.  HEENT: Head atraumatic, normocephalic. Oropharynx and  nasopharynx clear.  NECK:  Supple, no jugular venous distention. No thyroid enlargement, no tenderness.  LUNGS: Clear to auscultation, no wheezing, no rales, not using accessory muscles of respiration. CARDIOVASCULAR: S1 and S2 irregularly irregular.  ABDOMEN: Soft, nontender, nondistended. Bowel sounds present. No organomegaly or mass.  EXTREMITIES: No pedal edema, cyanosis, or clubbing.  NEUROLOGIC: Cranial nerves II through XII are intact. Muscle strength 5/5 in all extremities. Sensation intact. Gait not checked.  PSYCHIATRIC: The patient is alert and oriented x 3.  SKIN: No obvious rash, lesion, or ulcer.    LABORATORY PANEL:   CBC  Recent Labs Lab 11/12/15 0531  WBC 9.2  HGB 11.4*  HCT 34.3*  PLT 232   ------------------------------------------------------------------------------------------------------------------  Chemistries   Recent Labs Lab 11/09/15 1118 11/10/15 0415 11/11/15 0530  NA 136 137  --   K 3.6 3.4*  --   CL 101 106  --   CO2 26 25  --   GLUCOSE 213* 122*  --   BUN 27* 26*  --   CREATININE 1.30* 1.08 1.10  CALCIUM 9.1 8.4*  --   AST 25  --   --   ALT 19  --   --   ALKPHOS 91  --   --   BILITOT 2.3*  --   --    ------------------------------------------------------------------------------------------------------------------  Cardiac Enzymes  Recent Labs Lab 11/10/15 0415  TROPONINI 0.08*   ------------------------------------------------------------------------------------------------------------------  RADIOLOGY:  No results found.  EKG:   Orders placed or performed during the hospital encounter of 11/09/15  . EKG 12-Lead  . EKG 12-Lead    ASSESSMENT AND PLAN:   #  1 acute hypoxia secondary to pneumonia: Levaquin, discontinue vancomycin. Hypoxia improved. Continue duoneb.o2.clinically improving slowly.. Gram  positive bacteremia: Contamination.: DC vancomycin .  Acute renal failure with ATN due to dehydration: Improved with IV  hydration. Discontinued IV fluids  . #3 elevated troponins secondary to demand ischemia. Troponins did not go up.. Patient already on Coumadin and continue that. #4. Atrial fibrillation: slightly  Tachycardic, continue Coumadin, as the dose ofose of metoprolol 5 BPH continue Flomax. .  #6 hypertension ; controlled continue amlodipine, metoprolol. 7. Deconditioning: Physical therapy  Recommended home PT,likley d/c am D/w daughter  All the records are reviewed and case discussed with Care Management/Social Workerr. Management plans discussed with the patient, family and they are in agreement.  CODE STATUS: full  TOTAL TIME TAKING CARE OF THIS PATIENT: 35inutes.   POSSIBLE D/C IN 1-2DAYS, DEPENDING ON CLINICAL CONDITION.   Epifanio Lesches M.D on 11/12/2015 at 3:27 PM  Between 7am to 6pm - Pager - [phone]  After 6pm go to [email or web address] - password [email or web address] [facility]  Office  [phone]  CC: Primary care physician; [place] Pink, MD   Note: This dictation was prepared with Dragon dictation along with smaller phrase technology. Any transcriptional errors that result from this process are unintentional.

## 2015-11-12 NOTE — Care Management (Signed)
patient presents from home.  he does have inhome caregiver support but it is not round the clock.  His wife recently died. He is admitted with respiratory failure due to pneumonia.  Skilled nursing placement would not like to be considered.  Would be agreeable to home health nursing and physical therapy.  Discussed with daughter that initially patient would required round the clock supervision when he returns home.  Verbalizes understanding.  No agency preference for home health.  has access to a walker.  will need to be assessed for home 02

## 2015-11-12 NOTE — Progress Notes (Signed)
Confused. Bilat hearing aids in. 2 L of oxygen. A fib. Takes meds ok. Up to chair and tolerated it well. Ensure was added per family request. Family at the bedside. Iso d/c. Urinal. Pt has not reported any pain. Pt has no further concerns at this time.

## 2015-11-12 NOTE — Progress Notes (Signed)
Physical Therapy Treatment Patient Details Name: Charles Tapia MRN: [id number] DOB: 1918/06/21 Today's Date: 11/12/2015    History of Present Illness 82 y/o male here withAMS, respiratory failure secondary to PNA.  He does not use O2 at home.    PT Comments    Pt is progressing towards goals with increased ambulation noted this date. All mobility performed on room air with sats WNL. Pt requires RW at this time for energy conservation as he fatigues quickly. Good tolerance for there-ex, however does require 1 rest break. Pt motivated to perform therapy.  Follow Up Recommendations  Home health PT     Equipment Recommendations       Recommendations for Other Services       Precautions / Restrictions Precautions Precautions: Fall Restrictions Weight Bearing Restrictions: No    Mobility  Bed Mobility Overal bed mobility: Needs Assistance Bed Mobility: Supine to Sit     Supine to sit: Min assist     General bed mobility comments: asisst for scooting out towards EOB and cues for sequencing. Once at EOB, pt able to sit with supervision. Pt complains of dizziness with upright posture.  Transfers Overall transfer level: Needs assistance Equipment used: Rolling walker (2 wheeled) Transfers: Sit to/from Stand Sit to Stand: Min guard         General transfer comment: cues for safety and pushing from seated surface.  Ambulation/Gait Ambulation/Gait assistance: Min guard Ambulation Distance (Feet): 100 Feet Assistive device: Rolling walker (2 wheeled) Gait Pattern/deviations: Step-through pattern     General Gait Details: Pt with slow gait speed, however able to improve with further distance. Reciprocal gait pattern performed while on room air with sats at 91%. Pt fatigues with increased ambulation, however no LOB noted   Stairs            Wheelchair Mobility    Modified Rankin (Stroke Patients Only)       Balance                                     Cognition Arousal/Alertness: Awake/alert Behavior During Therapy: WFL for tasks assessed/performed Overall Cognitive Status: Within Functional Limits for tasks assessed                      Exercises Other Exercises Other Exercises: Supine ther-ex performed including B ankle pumps, quad sets, SLRs, SAQ, and hip abd/add. All ther-ex performed x 12 reps with cues for sequncing and supervision. Room air sats decrease to 88% during ther-ex, slight SOB symptoms noted with 1 break given    General Comments        Pertinent Vitals/Pain Pain Assessment: No/denies pain    Home Living                      Prior Function            PT Goals (current goals can now be found in the care plan section) Acute Rehab PT Goals Patient Stated Goal: "I would really like to go home" PT Goal Formulation: With patient Time For Goal Achievement: 11/26/15 Potential to Achieve Goals: Good Progress towards PT goals: Progressing toward goals    Frequency  Min 2X/week    PT Plan Current plan remains appropriate    Co-evaluation             End of Session Equipment Utilized During Treatment: Gait  belt Activity Tolerance: Patient tolerated treatment well;Patient limited by fatigue Patient left: in bed;with bed alarm set;with call bell/phone within reach     Time: [id number] PT Time Calculation (min) (ACUTE ONLY): 25 min  Charges:  $Gait Training: 8-22 mins $Therapeutic Exercise: 8-22 mins                    G Codes:      Charles Tapia 2015-11-15, 4:09 PM  Charles Tapia, PT, DPT [phone]

## 2015-11-12 NOTE — Care Management Important Message (Signed)
Important Message  Patient Details  Name: JERRIT WHITTEMORE MRN: [id number] Date of Birth: Jun 19, 1918   Medicare Important Message Given:       Katrina Stack, RN 11/12/2015, 5:29 PM

## 2015-11-12 NOTE — Progress Notes (Signed)
ANTICOAGULATION CONSULT NOTE - Initial Consult  Pharmacy Consult for [place]  Indication: atrial fibrillation  Allergies  Allergen Reactions  . Ace Inhibitors Other (See Comments)    Reaction:  Hyperkalemia and renal insufficiency   . Penicillins Hives and Other (See Comments)    Unable to obtain enough information to answer additional questions about this medication.      Patient Measurements: Height: 5\' 8"  (172.7 cm) Weight: 147 lb 11.2 oz (66.996 kg) IBW/kg (Calculated) : 68.4 Heparin Dosing Weight:   Vital Signs: Temp: 98.2 F (36.8 C) (01/09 1108) Temp Source: Oral (01/09 1108) BP: 128/76 mmHg (01/09 1108) Pulse Rate: 118 (01/09 1108)  Labs:  Recent Labs  11/09/15 1719 11/09/15 2217  11/10/15 0415 11/11/15 0530 11/12/15 0531  HGB  --   --   < > 11.6* 11.1* 11.4*  HCT  --   --   --  34.6* 33.6* 34.3*  PLT  --   --   --  193 197 232  LABPROT 30.6*  --   --  31.0* 31.8* 32.3*  INR 3.00  --   --  3.05 3.16 3.22  CREATININE  --   --   --  1.08 1.10  --   TROPONINI <0.03 <0.03  --  0.08*  --   --   < > = values in this interval not displayed.  Estimated Creatinine Clearance: 38.1 mL/min (by C-G formula based on Cr of 1.1).   Medical History: Past Medical History  Diagnosis Date  . Hypertension   . Cardiomyopathy   . CAD (coronary artery disease)   . Gout   . Hyperlipidemia   . Mitral regurgitation   . Allergy   . Stroke [facility])     Right putaminal hemorrhage    Medications:  Prescriptions prior to admission  Medication Sig Dispense Refill Last Dose  . allopurinol (ZYLOPRIM) 300 MG tablet Take 300 mg by mouth daily.   11/09/2015 at Unknown time  . amLODipine (NORVASC) 5 MG tablet Take 5 mg by mouth daily.   11/09/2015 at Unknown time  . calcium carbonate (OS-CAL) 600 MG TABS Take 600 mg by mouth daily.   11/09/2015 at Unknown time  . metoprolol succinate (TOPROL-XL) 50 MG 24 hr tablet Take 75 mg by mouth daily.    11/09/2015 at 0900  . polyethylene glycol  (MIRALAX / GLYCOLAX) packet Take 17 g by mouth daily as needed for mild constipation.    PRN at PRN  . simvastatin (ZOCOR) 20 MG tablet Take 20 mg by mouth at bedtime.    11/08/2015 at Unknown time  . tamsulosin (FLOMAX) 0.4 MG CAPS capsule Take 0.4 mg by mouth daily.   11/09/2015 at Unknown time  . warfarin (COUMADIN) 4 MG tablet Take 4 mg by mouth daily.   11/09/2015 at 0900  . acetaminophen (TYLENOL) 500 MG tablet Take 1,000 mg by mouth every 6 (six) hours as needed for mild pain.    PRN at PRN   Scheduled:  . amLODipine  5 mg Oral Daily  . calcium carbonate  500 mg Oral Daily  . feeding supplement (ENSURE ENLIVE)  237 mL Oral TID WC & HS  . ipratropium-albuterol  3 mL Nebulization Q6H WA  . levofloxacin (LEVAQUIN) IV  250 mg Intravenous Q24H  . [START ON 11/13/2015] metoprolol succinate  100 mg Oral Daily  . polyethylene glycol  17 g Oral Daily  . simvastatin  20 mg Oral QHS  . tamsulosin  0.4 mg Oral Daily  .  vancomycin  1,000 mg Intravenous Q24H  . Warfarin - Pharmacist Dosing Inpatient   Does not apply q1800    Assessment: Pharmacy consulted to assist with dosing and monitoring of warfarin therapy in this 82 year old woman for atrial fibrillation.  Potential DDI: Levofloxacin  1/9 INR 3.22  Goal of Therapy:  INR 2-3 Monitor platelets by anticoagulation protocol: Yes   Plan:  Will hold Coumadin for today and recheck INR with am labs. Pharmacy to follow and adjust per consult.   Paulina Fusi, PharmD, BCPS 11/12/2015 3:30 PM

## 2015-11-13 LAB — PROTIME-INR
INR: 3.59
PROTHROMBIN TIME: 35 s — AB (ref 11.4–15.0)

## 2015-11-13 MED ORDER — ENSURE ENLIVE PO LIQD: 237.0000 mL | Freq: Three times a day (TID) | ORAL | Status: DC

## 2015-11-13 MED ORDER — LEVOFLOXACIN 250 MG PO TABS: 250.0000 mg | ORAL_TABLET | Freq: Every day | ORAL | Status: DC

## 2015-11-13 MED ORDER — IPRATROPIUM-ALBUTEROL 0.5-2.5 (3) MG/3ML IN SOLN
3.0000 mL | Freq: Three times a day (TID) | RESPIRATORY_TRACT | Status: DC
  Administered 2015-11-13: 3 mL via RESPIRATORY_TRACT
  Filled 2015-11-13: qty 3

## 2015-11-13 MED ORDER — LEVALBUTEROL TARTRATE 45 MCG/ACT IN AERO: 1.0000 | INHALATION_SPRAY | Freq: Three times a day (TID) | RESPIRATORY_TRACT | Status: DC

## 2015-11-13 MED ORDER — METOPROLOL SUCCINATE ER 100 MG PO TB24: 100.0000 mg | ORAL_TABLET | Freq: Every day | ORAL | Status: DC

## 2015-11-13 NOTE — Progress Notes (Signed)
Patient discharged via wheelchair and private vehicle. IV removed and catheter intact. All discharge instructions given and patient verbalizes understanding. Tele removed and returned. Prescriptions escribed to pharmacy of choice. No distress noted.

## 2015-11-13 NOTE — Clinical Documentation Improvement (Signed)
Internal Medicine  Can the diagnosis of altered mental status be further specified?   Confusion/delirium (including drug induced)  Encephalopathy - Alcoholic, Anoxic/Hypoxia, Drug Induced/Toxic (specify drug), Hepatic, Hypertensive, Hypoglycemic, Metabolic/Septic, Traumatic/post concussive, Wernicke, Other  Other  Clinically Undetermined  Supporting Information: --  On admit AMS, confusion --  POA: pneumonia, acute renal failure w/ATN, acute respiratory failure with hypoxia, demand ischemia, Staph bacteremia  Please exercise your independent, professional judgment when responding. A specific answer is not anticipated or expected.   Thank You,  Ezekiel Ina RN [place] [phone]

## 2015-11-13 NOTE — Discharge Instructions (Signed)
Community-Acquired Pneumonia, Adult Pneumonia is an infection of the lungs. One type of pneumonia can happen while a person is in a hospital. A different type can happen when a person is not in a hospital (community-acquired pneumonia). It is easy for this kind to spread from person to person. It can spread to you if you breathe near an infected person who coughs or sneezes. Some symptoms include:  A dry cough.  A wet (productive) cough.  Fever.  Sweating.  Chest pain. HOME CARE  Take over-the-counter and prescription medicines only as told by your doctor.  Only take cough medicine if you are losing sleep.  If you were prescribed an antibiotic medicine, take it as told by your doctor. Do not stop taking the antibiotic even if you start to feel better.  Sleep with your head and neck raised (elevated). You can do this by putting a few pillows under your head, or you can sleep in a recliner.  Do not use tobacco products. These include cigarettes, chewing tobacco, and e-cigarettes. If you need help quitting, ask your doctor.  Drink enough water to keep your pee (urine) clear or pale yellow. A shot (vaccine) can help prevent pneumonia. Shots are often suggested for:  People older than 80 years of age.  People older than 80 years of age:  Who are having cancer treatment.  Who have long-term (chronic) lung disease.  Who have problems with their body's defense system (immune system). You may also prevent pneumonia if you take these actions:  Get the flu (influenza) shot every year.  Go to the dentist as often as told.  Wash your hands often. If soap and water are not available, use hand sanitizer. GET HELP IF:  You have a fever.  You lose sleep because your cough medicine does not help. GET HELP RIGHT AWAY IF:  You are short of breath and it gets worse.  You have more chest pain.  Your sickness gets worse. This is very serious if:  You are an older adult.  Your  body's defense system is weak.  You cough up blood.   This information is not intended to replace advice given to you by your health care provider. Make sure you discuss any questions you have with your health care provider.   Document Released: 04/07/2008 Document Revised: 07/11/2015 Document Reviewed: 02/14/2015 Elsevier Interactive Patient Education 2016 [employer].  Near-Syncope Near-syncope (commonly known as near fainting) is sudden weakness, dizziness, or feeling like you might pass out. This can happen when getting up or while standing for a long time. It is caused by a sudden decrease in blood flow to the brain, which can occur for various reasons. Most of the reasons are not serious.  HOME CARE Watch your condition for any changes.  Have someone stay with you until you feel stable.  If you feel like you are going to pass out:  Lie down right away.  Prop your feet up if you can.  Breathe deeply and steadily.  Move only when the feeling has gone away. Most of the time, this feeling lasts only a few minutes. You may feel tired for several hours.  Drink enough fluids to keep your pee (urine) clear or pale yellow.  If you are taking blood pressure or heart medicine, stand up slowly.  Follow up with your doctor as told. GET HELP RIGHT AWAY IF:   You have a severe headache.  You have unusual pain in the chest, belly (abdomen),  or back.  You have bleeding from the mouth or butt (rectum), or you have black or tarry poop (stool).  You feel your heart beat differently than normal, or you have a very fast pulse.  You pass out, or you twitch and shake when you pass out.  You pass out when sitting or lying down.  You feel confused.  You have trouble walking.  You are weak.  You have vision problems. MAKE SURE YOU:   Understand these instructions.  Will watch your condition.  Will get help right away if you are not doing well or get worse.   This information  is not intended to replace advice given to you by your health care provider. Make sure you discuss any questions you have with your health care provider.   Document Released: 04/07/2008 Document Revised: 11/10/2014 Document Reviewed: 03/25/2013 Elsevier Interactive Patient Education 2016 Else[employer]  Weakness Weakness is a lack of strength. You may feel weak all over your body or just in one part of your body. Weakness can be serious. In some cases, you may need more medical tests. HOME [place] a well-balanced diet.  Try to exercise every day.  Only take medicines as told by your doctor. GET HELP RIGHT AWAY IF:   You cannot do your normal daily activities.  You cannot walk up and down stairs, or you feel very tired when you do so.  You have shortness of breath or chest pain.  You have trouble moving parts of your body.  You have weakness in only one body part or on only one side of the body.  You have a fever.  You have trouble speaking or swallowing.  You cannot control when you pee (urinate) or poop (bowel movement).  You have black or bloody throw up (vomit) or poop.  Your weakness gets worse or spreads to other body parts.  You have new aches or pains. MAKE SURE YOU:   Understand these instructions.  Will watch your condition.  Will get help right away if you are not doing well or get worse.   This information is not intended to replace advice given to you by your health care provider. Make sure you discuss any questions you have with your health care provider.   Document Released: 10/02/2008 Document Revised: 04/20/2012 Document Reviewed: 12/19/2011 Elsevier Interactive Patient Education [employer].

## 2015-11-13 NOTE — Progress Notes (Signed)
SATURATION QUALIFICATIONS: (This note is used to comply with regulatory documentation for home oxygen)  Patient Saturations on Room Air at Rest = 93%  Patient Saturations on Room Air while Ambulating = 91%  Patient Saturations on na Liters of oxygen while Ambulating = na %  Please briefly explain why patient needs home oxygen: patient does not need home O2 per assessment

## 2015-11-13 NOTE — Care Management Important Message (Signed)
Important Message  Patient Details  Name: Charles Tapia MRN: [id number] Date of Birth: 1918-10-01   Medicare Important Message Given:  Yes    Katrina Stack, RN 11/13/2015, 8:29 AM

## 2015-11-13 NOTE — Care Management (Signed)
For discharge home today.  Agreeable to home health and agency preference is [facility].  Referral called to [facility].  Services ordered is SN and PT

## 2015-11-13 NOTE — Progress Notes (Signed)
[place] [facility] [place] NAME: Charles Tapia    MR#:  [id number]  DATE OF BIRTH:  1918/01/03  SUBJECTIVE: Admitted for acute respiratory failure with hypoxia due to pneumonia.hypoxia resolved.pt feels better.will discharge home with Abx and home PT.  CHIEF COMPLAINT:   Chief Complaint  Patient presents with  . Altered Mental Status    REVIEW OF SYSTEMS:   ROS CONSTITUTIONAL: No fever, fatigue or weakness.  EYES: No blurred or double vision.  EARS, NOSE, AND THROAT: No tinnitus or ear pain.  RESPIRATORY: les  cough, less shortness of breath, less wheezing. CARDIOVASCULAR: No chest pain, orthopnea, edema.  GASTROINTESTINAL: No nausea, vomiting, diarrhea or abdominal pain.  GENITOURINARY: No dysuria, hematuria.  ENDOCRINE: No polyuria, nocturia,  HEMATOLOGY: No anemia, easy bruising or bleeding SKIN: No rash or lesion. MUSCULOSKELETAL: No joint pain or arthritis.   NEUROLOGIC: No tingling, numbness, weakness.  PSYCHIATRY: No anxiety or depression.   DRUG ALLERGIES:   Allergies  Allergen Reactions  . Ace Inhibitors Other (See Comments)    Reaction:  Hyperkalemia and renal insufficiency   . Penicillins Hives and Other (See Comments)    Unable to obtain enough information to answer additional questions about this medication.      VITALS:  Blood pressure 128/67, pulse 96, temperature 98 F (36.7 C), temperature source Oral, resp. rate 20, height 5\' 8"  (1.727 m), weight 66.996 kg (147 lb 11.2 oz), SpO2 94 %.  PHYSICAL EXAMINATION:  GENERAL:  82 y.o.-year-old patient lying in the bed with no acute distress.  EYES: Pupils equal, round, reactive to light and accommodation. No scleral icterus. Extraocular muscles intact.  HEENT: Head atraumatic, normocephalic. Oropharynx and nasopharynx clear.  NECK:  Supple, no jugular venous distention. No thyroid enlargement, no tenderness.  LUNGS: Clear to auscultation, no wheezing, no rales, not  using accessory muscles of respiration. CARDIOVASCULAR: S1 and S2 irregularly irregular.  ABDOMEN: Soft, nontender, nondistended. Bowel sounds present. No organomegaly or mass.  EXTREMITIES: No pedal edema, cyanosis, or clubbing.  NEUROLOGIC: Cranial nerves II through XII are intact. Muscle strength 5/5 in all extremities. Sensation intact. Gait not checked.  PSYCHIATRIC: The patient is alert and oriented x 3.  SKIN: No obvious rash, lesion, or ulcer.    LABORATORY PANEL:   CBC  Recent Labs Lab 11/12/15 0531  WBC 9.2  HGB 11.4*  HCT 34.3*  PLT 232   ------------------------------------------------------------------------------------------------------------------  Chemistries   Recent Labs Lab 11/09/15 1118 11/10/15 0415 11/11/15 0530  NA 136 137  --   K 3.6 3.4*  --   CL 101 106  --   CO2 26 25  --   GLUCOSE 213* 122*  --   BUN 27* 26*  --   CREATININE 1.30* 1.08 1.10  CALCIUM 9.1 8.4*  --   AST 25  --   --   ALT 19  --   --   ALKPHOS 91  --   --   BILITOT 2.3*  --   --    ------------------------------------------------------------------------------------------------------------------  Cardiac Enzymes  Recent Labs Lab 11/10/15 0415  TROPONINI 0.08*   ------------------------------------------------------------------------------------------------------------------  RADIOLOGY:  No results found.  EKG:   Orders placed or performed during the hospital encounter of 11/09/15  . EKG 12-Lead  . EKG 12-Lead    ASSESSMENT AND PLAN:   #1 acute hypoxia secondary to pneumonia:discharged home with levaquin. .  Acute renal failure with ATN due to dehydration: Improved with IV hydration. . #3 elevated  troponins secondary to demand ischemia. Troponins did not go up.. Patient already on Coumadin and continue that. #4. Atrial fibrillation: slightly  Tachycardic, continue Coumadin,  Increased metoprolol dose, 5 BPH continue Flomax. .  #6 hypertension ;  controlled continue amlodipine, metoprolol. 7. Deconditioning: Physical therapy  Recommended home PT,discharged home  Today with home PT.  All the records are reviewed and case discussed with Care Management/Social Workerr. Management plans discussed with the patient, family and they are in agreement.  CODE STATUS: full  TOTAL TIME TAKING CARE OF THIS PATIENT: 35inutes.   POSSIBLE D/C IN 1-2DAYS, DEPENDING ON CLINICAL CONDITION.   Epifanio Lesches M.D on 11/13/2015 at 8:44 PM  Between 7am to 6pm - Pager - [phone]  After 6pm go to [email or web address] - password [email or web address] [facility]  Office  [phone]  CC: Primary care physician; [place] Pink, MD   Note: This dictation was prepared with Dragon dictation along with smaller phrase technology. Any transcriptional errors that result from this process are unintentional.

## 2015-11-14 DIAGNOSIS — N179 Acute kidney failure, unspecified: Secondary | ICD-10-CM | POA: Diagnosis not present

## 2015-11-14 DIAGNOSIS — I2510 Atherosclerotic heart disease of native coronary artery without angina pectoris: Secondary | ICD-10-CM | POA: Diagnosis not present

## 2015-11-14 DIAGNOSIS — E785 Hyperlipidemia, unspecified: Secondary | ICD-10-CM | POA: Diagnosis not present

## 2015-11-14 DIAGNOSIS — I429 Cardiomyopathy, unspecified: Secondary | ICD-10-CM | POA: Diagnosis not present

## 2015-11-14 DIAGNOSIS — I4891 Unspecified atrial fibrillation: Secondary | ICD-10-CM | POA: Diagnosis not present

## 2015-11-14 DIAGNOSIS — J9601 Acute respiratory failure with hypoxia: Secondary | ICD-10-CM | POA: Diagnosis not present

## 2015-11-14 DIAGNOSIS — I340 Nonrheumatic mitral (valve) insufficiency: Secondary | ICD-10-CM | POA: Diagnosis not present

## 2015-11-14 DIAGNOSIS — M109 Gout, unspecified: Secondary | ICD-10-CM | POA: Diagnosis not present

## 2015-11-14 DIAGNOSIS — J189 Pneumonia, unspecified organism: Secondary | ICD-10-CM | POA: Diagnosis not present

## 2015-11-14 LAB — CULTURE, BLOOD (ROUTINE X 2)

## 2015-11-14 NOTE — Discharge Summary (Signed)
Charles Tapia, is a 82 y.o. male  DOB 05/21/1918  MRN [id number].  Admission date:  11/09/2015  Admitting Physician  Baxter Hire, MD  Discharge Date:  11/14/2015   Primary MD  Maryland Pink, MD  Recommendations for primary care physician for things to follow:  Follow-up with primary doctor in 1 week.   Admission Diagnosis  Bronchopneumonia [J18.0] General weakness [R53.1] Troponin level elevated [R79.89]   Discharge Diagnosis  Bronchopneumonia [J18.0] General weakness [R53.1] Troponin level elevated [R79.89]    Active Problems:   Pneumonia      Past Medical History  Diagnosis Date  . Hypertension   . Cardiomyopathy   . CAD (coronary artery disease)   . Gout   . Hyperlipidemia   . Mitral regurgitation   . Allergy   . Stroke [facility])     Right putaminal hemorrhage    Past Surgical History  Procedure Laterality Date  . Coronary bypass graft stenosis    . Coronary artery bypass graft    . Appendectomy    . Back surgery    . Spine surgery         History of present illness and  Hospital Course:     Kindly see H&P for history of present illness and admission details, please review complete Labs, Consult reports and Test reports for all details in brief  HPI  from the history and physical done on the day of admission  82 year old male patient admitted because of confusion, found to have pneumonia. Treated for pneumonia, acute renal failure, confusion.  Hospital Course  #1: Bilateral pneumonia causing confusion, decreased by mouth intake: Patient is given meropenem on admission, change it to him, aching, Levaquin. And discharge home with Levaquin. #2 acute hypoxia secondary to pneumonia: Patient did have some wheezing so we got him on nebulizers, oxygen. Symptoms improved nicely and he came off the  oxygen. And saturations were 89% on room air when he came. Improved to 94% at discharge on room air. In between he needed oxygen 2 L which weaned off gradually. #3. acute renal failure: BUN 27 creatinine 1.30 on admission improved with IV hydration. Creatinine 1.08 on JAn 7th. #4 slightly elevated troponins of 0.08: Intact secondary to demand ischemia from pneumonia and acute renal failure, never had chest pain. Did not have any further workup. #5. Deconditioning: Physical therapy did recommend any physical therapy at home. Discharge home with home physical therapy. #6 hypertension: Controlled with amlodipine, metoprolol. Atrial fibrillation: ; tachycardic during hospital stay, so we increased the metoprolol, continue the Coumadin.  Discharge Condition: stable   Follow UP  Follow-up Information    Follow up with Maryland Pink, MD. Call in 1 week.   Specialty:  Family Medicine   Why:  Office is closed on 11/12/14. Please call to make this appointment.   Contact information:   [address] [place]  [address] [phone]         Discharge Instructions  and  Discharge Medications     Discharge Instructions    Face-to-face encounter (required for Medicare/Medicaid patients)    Complete by:  As directed   I [facility] certify that this patient is under my care and that I, or a nurse practitioner or physician's assistant working with me, had a face-to-face encounter that meets the physician face-to-face encounter requirements with this patient on 11/13/2015. The encounter with the patient was in whole, or in part for the following medical condition(s)  Hypoxia Pneumonia  HTN Chronic afib deconditioning  The encounter with the patient was in whole, or in part, for the following medical condition, which is the primary reason for home health care:  whole  I certify that, based on my findings, the following services are medically necessary home health  services:   Nursing Physical therapy    Reason for Medically Necessary Home Health Services:  Therapy- Personnel officer, Public librarian  My clinical findings support the need for the above services:  Unable to leave home safely without assistance and/or assistive device  Further, I certify that my clinical findings support that this patient is homebound due to:  Unsafe ambulation due to balance issues     Home Health    Complete by:  As directed   To provide the following care/treatments:   RN PT              Medication List    TAKE these medications        acetaminophen 500 MG tablet  Commonly known as:  TYLENOL  Take 1,000 mg by mouth every 6 (six) hours as needed for mild pain.     allopurinol 300 MG tablet  Commonly known as:  ZYLOPRIM  Take 300 mg by mouth daily.     amLODipine 5 MG tablet  Commonly known as:  NORVASC  Take 5 mg by mouth daily.     calcium carbonate 600 MG Tabs tablet  Commonly known as:  OS-CAL  Take 600 mg by mouth daily.     feeding supplement (ENSURE ENLIVE) Liqd  Take 237 mLs by mouth 4 (four) times daily -  with meals and at bedtime.     levalbuterol 45 MCG/ACT inhaler  Commonly known as:  XOPENEX HFA  Inhale 1 puff into the lungs 3 (three) times daily.     levofloxacin 250 MG tablet  Commonly known as:  LEVAQUIN  Take 1 tablet (250 mg total) by mouth daily.     metoprolol succinate 100 MG 24 hr tablet  Commonly known as:  TOPROL XL  Take 1 tablet (100 mg total) by mouth daily. Take with or immediately following a meal.     polyethylene glycol packet  Commonly known as:  MIRALAX / GLYCOLAX  Take 17 g by mouth daily as needed for mild constipation.     simvastatin 20 MG tablet  Commonly known as:  ZOCOR  Take 20 mg by mouth at bedtime.     tamsulosin 0.4 MG Caps capsule  Commonly known as:  FLOMAX  Take 0.4 mg by mouth daily.     warfarin 4 MG tablet  Commonly known as:  COUMADIN  Take 4 mg by mouth daily.           Diet and Activity recommendation: See Discharge Instructions above   Consults obtained - physical therapy Major procedures and Radiology Reports - PLEASE review detailed and final reports for all details, in brief -     Dg Chest 1 View  11/09/2015  CLINICAL DATA:  Cough, 1 week duration. EXAM: CHEST 1 VIEW COMPARISON:  10/08/2012 FINDINGS: Previous median sternotomy and CABG. Heart size is normal. Atherosclerosis of the aorta. There are extensive infiltrates in both lower lobes consistent with bronchopneumonia. Upper lungs are clear. No effusions. Chronic elevation of the right hemidiaphragm. IMPRESSION: Bilateral lower lobe bronchopneumonia. Electronically Signed   By: Nelson Chimes M.D.   On: 11/09/2015 15:06   Ct Head Wo Contrast  11/09/2015  CLINICAL DATA:  Patient with cough and altered mental status. EXAM: CT HEAD WITHOUT CONTRAST TECHNIQUE: Contiguous axial images were obtained from the base of the skull through the vertex without intravenous contrast. COMPARISON:  Brain CT 10/09/2012. FINDINGS: Ventricles and sulci are prominent compatible with atrophy. No evidence for acute cortically based infarct, intracranial hemorrhage, mass lesion or mass-effect. Orbits are unremarkable. Air-fluid levels demonstrated within frontal sinus, ethmoid air cells bilateral maxillary sinuses. Mastoid air cells are unremarkable. Calvarium is intact. IMPRESSION: No acute intracranial process. Cortical atrophy. Air-fluid levels within the paranasal sinuses most compatible with acute sinusitis. Electronically Signed   By: Lovey Newcomer M.D.   On: 11/09/2015 15:29    Micro Results    Recent Results (from the past 240 hour(s))  Culture, blood (routine x 2)     Status: None   Collection Time: 11/09/15  3:01 PM  Result Value Ref Range Status   Specimen Description BLOOD LEFT ARM  Final   Special Requests BOTTLES DRAWN AEROBIC AND ANAEROBIC 4CCAERO,3CCANA  Final   Culture  Setup Time   Final    GRAM  POSITIVE COCCI IN BOTH AEROBIC AND ANAEROBIC BOTTLES CRITICAL VALUE NOTED.  VALUE IS CONSISTENT WITH PREVIOUSLY REPORTED AND CALLED VALUE.    Culture   Final    Two different species of coagulase negative Staphylococcus isolated. IN BOTH AEROBIC AND ANAEROBIC BOTTLES Results consistent with contamination.    Report Status 11/14/2015 FINAL  Final  Culture, blood (routine x 2)     Status: None   Collection Time: 11/09/15  3:30 PM  Result Value Ref Range Status   Specimen Description BLOOD LEFT ASSIST CONTROL  Final   Special Requests BOTTLES DRAWN AEROBIC AND ANAEROBIC 3CCAERO,2CCANA  Final   Culture  Setup Time   Final    GRAM POSITIVE COCCI IN BOTH AEROBIC AND ANAEROBIC BOTTLES CRITICAL RESULT CALLED TO, READ BACK BY AND VERIFIED WITH: SHEEMA HALLAJI AT [id number] 11/10/15 DV    Culture   Final    STAPHYLOCOCCUS HOMINIS STAPHYLOCOCCUS EPIDERMIDIS IN BOTH AEROBIC AND ANAEROBIC BOTTLES Results consistent with contamination.    Report Status 11/14/2015 FINAL  Final   Organism ID, Bacteria STAPHYLOCOCCUS HOMINIS  Final      Susceptibility   Staphylococcus hominis - MIC*    CIPROFLOXACIN Value in next row Sensitive      SENSITIVE<=0.5    ERYTHROMYCIN Value in next row Resistant      RESISTANT>=8    CLINDAMYCIN Value in next row Sensitive      SENSITIVE<=0.25    GENTAMICIN Value in next row Sensitive      SENSITIVE<=0.5    NITROFURANTOIN Value in next row Sensitive      SENSITIVE32    OXACILLIN Value in next row Sensitive      SENSITIVE<=0.25 WARNING: For oxacillin-resistant S.aureus and coagulase-negative staphylococci (MRS), other beta-lactam agents, ie, penicillins, beta-lactam/beta-lactamase inhibitor combinations, cephems (with the exception of the cephalosporins with anti-MRSA activity), and carbapenems, may appear active in vitro, but are not effective clinically.  --CLSI, Vol.32 No.3, January 2012, pg 70.    TETRACYCLINE Value in next row Sensitive      SENSITIVE<=1     VANCOMYCIN Value in next row Sensitive      SENSITIVE<=0.5    TRIMETH/SULFA Value in next row Sensitive      SENSITIVE<=10    RIFAMPIN Value in next row Sensitive      SENSITIVE<=0.5    LINEZOLID Value in next row Sensitive      SENSITIVE4    *  STAPHYLOCOCCUS HOMINIS  Blood Culture ID Panel (Reflexed)     Status: Abnormal   Collection Time: 11/09/15  3:30 PM  Result Value Ref Range Status   Enterococcus species NOT DETECTED NOT DETECTED Final   Listeria monocytogenes NOT DETECTED NOT DETECTED Final   Staphylococcus species DETECTED (A) NOT DETECTED Final    Comment: CRITICAL RESULT CALLED TO, READ BACK BY AND VERIFIED WITH: SHEEMA HALLAJI AT 1142 11/10/15 DV REPORTED STAPH SPECIES AND MECA    Staphylococcus aureus NOT DETECTED NOT DETECTED Final   Streptococcus species NOT DETECTED NOT DETECTED Final   Streptococcus agalactiae NOT DETECTED NOT DETECTED Final   Streptococcus pneumoniae NOT DETECTED NOT DETECTED Final   Streptococcus pyogenes NOT DETECTED NOT DETECTED Final   Acinetobacter baumannii NOT DETECTED NOT DETECTED Final   Enterobacteriaceae species NOT DETECTED NOT DETECTED Final   Enterobacter cloacae complex NOT DETECTED NOT DETECTED Final   Escherichia coli NOT DETECTED NOT DETECTED Final   Klebsiella oxytoca NOT DETECTED NOT DETECTED Final   Klebsiella pneumoniae NOT DETECTED NOT DETECTED Final   Proteus species NOT DETECTED NOT DETECTED Final   Serratia marcescens NOT DETECTED NOT DETECTED Final   Haemophilus influenzae NOT DETECTED NOT DETECTED Final   Neisseria meningitidis NOT DETECTED NOT DETECTED Final   Pseudomonas aeruginosa NOT DETECTED NOT DETECTED Final   Candida albicans NOT DETECTED NOT DETECTED Final   Candida glabrata NOT DETECTED NOT DETECTED Final   Candida krusei NOT DETECTED NOT DETECTED Final   Candida parapsilosis NOT DETECTED NOT DETECTED Final   Candida tropicalis NOT DETECTED NOT DETECTED Final   Carbapenem resistance NOT DETECTED NOT DETECTED  Final   Methicillin resistance DETECTED (A) NOT DETECTED Final   Vancomycin resistance NOT DETECTED NOT DETECTED Final       Today   Subjective:   Charles Tapia today has no headache,no chest abdominal pain,no new weakness tingling or numbness, feels much better wants to go home today.   Objective:   Blood pressure 128/67, pulse 96, temperature 98 F (36.7 C), temperature source Oral, resp. rate 20, height 5\' 8"  (1.727 m), weight 66.996 kg (147 lb 11.2 oz), SpO2 94 %.  No intake or output data in the 24 hours ending 11/14/15 1326  Exam Awake Alert, Oriented x 3, No new F.N deficits, Normal affect [place].AT,PERRAL Supple Neck,No JVD, No cervical lymphadenopathy appriciated.  Symmetrical Chest wall movement, Good air movement bilaterally, CTAB RRR,No Gallops,Rubs or new Murmurs, No Parasternal Heave +ve B.Sounds, Abd Soft, Non tender, No organomegaly appriciated, No rebound -guarding or rigidity. No Cyanosis, Clubbing or edema, No new Rash or bruise  Data Review   CBC w Diff:  Lab Results  Component Value Date   WBC 9.2 11/12/2015   WBC 6.5 10/08/2012   HGB 11.4* 11/12/2015   HGB 11.9* 10/08/2012   HCT 34.3* 11/12/2015   HCT 35.0* 10/08/2012   PLT 232 11/12/2015   PLT 185 10/08/2012   LYMPHOPCT 30 11/28/2011   MONOPCT 9 11/28/2011   EOSPCT 3 11/28/2011   BASOPCT 0 11/28/2011    CMP:  Lab Results  Component Value Date   NA 137 11/10/2015   NA 139 10/08/2012   K 3.4* 11/10/2015   K 3.9 10/08/2012   CL 106 11/10/2015   CL 105 10/08/2012   CO2 25 11/10/2015   CO2 27 10/08/2012   BUN 26* 11/10/2015   BUN 25* 10/08/2012   CREATININE 1.10 11/11/2015   CREATININE 1.10 10/08/2012   PROT 7.2 11/09/2015   PROT 6.6  10/08/2012   ALBUMIN 3.8 11/09/2015   ALBUMIN 3.6 10/08/2012   BILITOT 2.3* 11/09/2015   BILITOT 0.6 10/08/2012   ALKPHOS 91 11/09/2015   ALKPHOS 114 10/08/2012   AST 25 11/09/2015   AST 30 10/08/2012   ALT 19 11/09/2015   ALT 23 10/08/2012   .   Total Time in preparing paper work, data evaluation and todays exam - 17 minutes  Luster Hechler M.D on 11/13/2015 at 1:26 PM    Note: This dictation was prepared with Dragon dictation along with smaller phrase technology. Any transcriptional errors that result from this process are unintentional.

## 2015-11-15 DIAGNOSIS — J9601 Acute respiratory failure with hypoxia: Secondary | ICD-10-CM | POA: Diagnosis not present

## 2015-11-15 DIAGNOSIS — I340 Nonrheumatic mitral (valve) insufficiency: Secondary | ICD-10-CM | POA: Diagnosis not present

## 2015-11-15 DIAGNOSIS — I4891 Unspecified atrial fibrillation: Secondary | ICD-10-CM | POA: Diagnosis not present

## 2015-11-15 DIAGNOSIS — I429 Cardiomyopathy, unspecified: Secondary | ICD-10-CM | POA: Diagnosis not present

## 2015-11-15 DIAGNOSIS — I2510 Atherosclerotic heart disease of native coronary artery without angina pectoris: Secondary | ICD-10-CM | POA: Diagnosis not present

## 2015-11-15 DIAGNOSIS — N179 Acute kidney failure, unspecified: Secondary | ICD-10-CM | POA: Diagnosis not present

## 2015-11-15 DIAGNOSIS — E785 Hyperlipidemia, unspecified: Secondary | ICD-10-CM | POA: Diagnosis not present

## 2015-11-15 DIAGNOSIS — J189 Pneumonia, unspecified organism: Secondary | ICD-10-CM | POA: Diagnosis not present

## 2015-11-15 DIAGNOSIS — M109 Gout, unspecified: Secondary | ICD-10-CM | POA: Diagnosis not present

## 2015-11-16 DIAGNOSIS — E785 Hyperlipidemia, unspecified: Secondary | ICD-10-CM | POA: Diagnosis not present

## 2015-11-16 DIAGNOSIS — J189 Pneumonia, unspecified organism: Secondary | ICD-10-CM | POA: Diagnosis not present

## 2015-11-16 DIAGNOSIS — N179 Acute kidney failure, unspecified: Secondary | ICD-10-CM | POA: Diagnosis not present

## 2015-11-16 DIAGNOSIS — I2510 Atherosclerotic heart disease of native coronary artery without angina pectoris: Secondary | ICD-10-CM | POA: Diagnosis not present

## 2015-11-16 DIAGNOSIS — J9601 Acute respiratory failure with hypoxia: Secondary | ICD-10-CM | POA: Diagnosis not present

## 2015-11-16 DIAGNOSIS — I429 Cardiomyopathy, unspecified: Secondary | ICD-10-CM | POA: Diagnosis not present

## 2015-11-16 DIAGNOSIS — I340 Nonrheumatic mitral (valve) insufficiency: Secondary | ICD-10-CM | POA: Diagnosis not present

## 2015-11-16 DIAGNOSIS — M109 Gout, unspecified: Secondary | ICD-10-CM | POA: Diagnosis not present

## 2015-11-16 DIAGNOSIS — I4891 Unspecified atrial fibrillation: Secondary | ICD-10-CM | POA: Diagnosis not present

## 2015-11-19 DIAGNOSIS — I429 Cardiomyopathy, unspecified: Secondary | ICD-10-CM | POA: Diagnosis not present

## 2015-11-19 DIAGNOSIS — I2510 Atherosclerotic heart disease of native coronary artery without angina pectoris: Secondary | ICD-10-CM | POA: Diagnosis not present

## 2015-11-19 DIAGNOSIS — M109 Gout, unspecified: Secondary | ICD-10-CM | POA: Diagnosis not present

## 2015-11-19 DIAGNOSIS — J189 Pneumonia, unspecified organism: Secondary | ICD-10-CM | POA: Diagnosis not present

## 2015-11-19 DIAGNOSIS — I4891 Unspecified atrial fibrillation: Secondary | ICD-10-CM | POA: Diagnosis not present

## 2015-11-19 DIAGNOSIS — E785 Hyperlipidemia, unspecified: Secondary | ICD-10-CM | POA: Diagnosis not present

## 2015-11-19 DIAGNOSIS — J9601 Acute respiratory failure with hypoxia: Secondary | ICD-10-CM | POA: Diagnosis not present

## 2015-11-19 DIAGNOSIS — I340 Nonrheumatic mitral (valve) insufficiency: Secondary | ICD-10-CM | POA: Diagnosis not present

## 2015-11-19 DIAGNOSIS — N179 Acute kidney failure, unspecified: Secondary | ICD-10-CM | POA: Diagnosis not present

## 2015-11-20 DIAGNOSIS — I429 Cardiomyopathy, unspecified: Secondary | ICD-10-CM | POA: Diagnosis not present

## 2015-11-20 DIAGNOSIS — J9601 Acute respiratory failure with hypoxia: Secondary | ICD-10-CM | POA: Diagnosis not present

## 2015-11-20 DIAGNOSIS — M109 Gout, unspecified: Secondary | ICD-10-CM | POA: Diagnosis not present

## 2015-11-20 DIAGNOSIS — E785 Hyperlipidemia, unspecified: Secondary | ICD-10-CM | POA: Diagnosis not present

## 2015-11-20 DIAGNOSIS — I340 Nonrheumatic mitral (valve) insufficiency: Secondary | ICD-10-CM | POA: Diagnosis not present

## 2015-11-20 DIAGNOSIS — I4891 Unspecified atrial fibrillation: Secondary | ICD-10-CM | POA: Diagnosis not present

## 2015-11-20 DIAGNOSIS — I2510 Atherosclerotic heart disease of native coronary artery without angina pectoris: Secondary | ICD-10-CM | POA: Diagnosis not present

## 2015-11-20 DIAGNOSIS — N179 Acute kidney failure, unspecified: Secondary | ICD-10-CM | POA: Diagnosis not present

## 2015-11-20 DIAGNOSIS — J189 Pneumonia, unspecified organism: Secondary | ICD-10-CM | POA: Diagnosis not present

## 2015-11-21 DIAGNOSIS — I10 Essential (primary) hypertension: Secondary | ICD-10-CM | POA: Diagnosis not present

## 2015-11-21 DIAGNOSIS — J15211 Pneumonia due to Methicillin susceptible Staphylococcus aureus: Secondary | ICD-10-CM | POA: Diagnosis not present

## 2015-11-22 DIAGNOSIS — M109 Gout, unspecified: Secondary | ICD-10-CM | POA: Diagnosis not present

## 2015-11-22 DIAGNOSIS — N179 Acute kidney failure, unspecified: Secondary | ICD-10-CM | POA: Diagnosis not present

## 2015-11-22 DIAGNOSIS — J9601 Acute respiratory failure with hypoxia: Secondary | ICD-10-CM | POA: Diagnosis not present

## 2015-11-22 DIAGNOSIS — E785 Hyperlipidemia, unspecified: Secondary | ICD-10-CM | POA: Diagnosis not present

## 2015-11-22 DIAGNOSIS — J189 Pneumonia, unspecified organism: Secondary | ICD-10-CM | POA: Diagnosis not present

## 2015-11-22 DIAGNOSIS — I2510 Atherosclerotic heart disease of native coronary artery without angina pectoris: Secondary | ICD-10-CM | POA: Diagnosis not present

## 2015-11-22 DIAGNOSIS — I4891 Unspecified atrial fibrillation: Secondary | ICD-10-CM | POA: Diagnosis not present

## 2015-11-22 DIAGNOSIS — I429 Cardiomyopathy, unspecified: Secondary | ICD-10-CM | POA: Diagnosis not present

## 2015-11-22 DIAGNOSIS — I340 Nonrheumatic mitral (valve) insufficiency: Secondary | ICD-10-CM | POA: Diagnosis not present

## 2015-11-26 DIAGNOSIS — E785 Hyperlipidemia, unspecified: Secondary | ICD-10-CM | POA: Diagnosis not present

## 2015-11-26 DIAGNOSIS — I429 Cardiomyopathy, unspecified: Secondary | ICD-10-CM | POA: Diagnosis not present

## 2015-11-26 DIAGNOSIS — I2510 Atherosclerotic heart disease of native coronary artery without angina pectoris: Secondary | ICD-10-CM | POA: Diagnosis not present

## 2015-11-26 DIAGNOSIS — J189 Pneumonia, unspecified organism: Secondary | ICD-10-CM | POA: Diagnosis not present

## 2015-11-26 DIAGNOSIS — N179 Acute kidney failure, unspecified: Secondary | ICD-10-CM | POA: Diagnosis not present

## 2015-11-26 DIAGNOSIS — M109 Gout, unspecified: Secondary | ICD-10-CM | POA: Diagnosis not present

## 2015-11-26 DIAGNOSIS — I340 Nonrheumatic mitral (valve) insufficiency: Secondary | ICD-10-CM | POA: Diagnosis not present

## 2015-11-26 DIAGNOSIS — J9601 Acute respiratory failure with hypoxia: Secondary | ICD-10-CM | POA: Diagnosis not present

## 2015-11-26 DIAGNOSIS — I4891 Unspecified atrial fibrillation: Secondary | ICD-10-CM | POA: Diagnosis not present

## 2015-11-27 DIAGNOSIS — I340 Nonrheumatic mitral (valve) insufficiency: Secondary | ICD-10-CM | POA: Diagnosis not present

## 2015-11-27 DIAGNOSIS — I4891 Unspecified atrial fibrillation: Secondary | ICD-10-CM | POA: Diagnosis not present

## 2015-11-27 DIAGNOSIS — J189 Pneumonia, unspecified organism: Secondary | ICD-10-CM | POA: Diagnosis not present

## 2015-11-27 DIAGNOSIS — I429 Cardiomyopathy, unspecified: Secondary | ICD-10-CM | POA: Diagnosis not present

## 2015-11-27 DIAGNOSIS — N179 Acute kidney failure, unspecified: Secondary | ICD-10-CM | POA: Diagnosis not present

## 2015-11-27 DIAGNOSIS — I2510 Atherosclerotic heart disease of native coronary artery without angina pectoris: Secondary | ICD-10-CM | POA: Diagnosis not present

## 2015-11-27 DIAGNOSIS — M109 Gout, unspecified: Secondary | ICD-10-CM | POA: Diagnosis not present

## 2015-11-27 DIAGNOSIS — J9601 Acute respiratory failure with hypoxia: Secondary | ICD-10-CM | POA: Diagnosis not present

## 2015-11-27 DIAGNOSIS — E785 Hyperlipidemia, unspecified: Secondary | ICD-10-CM | POA: Diagnosis not present

## 2015-11-28 DIAGNOSIS — E785 Hyperlipidemia, unspecified: Secondary | ICD-10-CM | POA: Diagnosis not present

## 2015-11-28 DIAGNOSIS — J189 Pneumonia, unspecified organism: Secondary | ICD-10-CM | POA: Diagnosis not present

## 2015-11-28 DIAGNOSIS — N179 Acute kidney failure, unspecified: Secondary | ICD-10-CM | POA: Diagnosis not present

## 2015-11-28 DIAGNOSIS — J9601 Acute respiratory failure with hypoxia: Secondary | ICD-10-CM | POA: Diagnosis not present

## 2015-11-28 DIAGNOSIS — M109 Gout, unspecified: Secondary | ICD-10-CM | POA: Diagnosis not present

## 2015-11-28 DIAGNOSIS — I2510 Atherosclerotic heart disease of native coronary artery without angina pectoris: Secondary | ICD-10-CM | POA: Diagnosis not present

## 2015-11-28 DIAGNOSIS — I4891 Unspecified atrial fibrillation: Secondary | ICD-10-CM | POA: Diagnosis not present

## 2015-11-28 DIAGNOSIS — I429 Cardiomyopathy, unspecified: Secondary | ICD-10-CM | POA: Diagnosis not present

## 2015-11-28 DIAGNOSIS — I340 Nonrheumatic mitral (valve) insufficiency: Secondary | ICD-10-CM | POA: Diagnosis not present

## 2015-12-03 DIAGNOSIS — I4891 Unspecified atrial fibrillation: Secondary | ICD-10-CM | POA: Diagnosis not present

## 2015-12-03 DIAGNOSIS — I429 Cardiomyopathy, unspecified: Secondary | ICD-10-CM | POA: Diagnosis not present

## 2015-12-03 DIAGNOSIS — I2510 Atherosclerotic heart disease of native coronary artery without angina pectoris: Secondary | ICD-10-CM | POA: Diagnosis not present

## 2015-12-03 DIAGNOSIS — N179 Acute kidney failure, unspecified: Secondary | ICD-10-CM | POA: Diagnosis not present

## 2015-12-03 DIAGNOSIS — M109 Gout, unspecified: Secondary | ICD-10-CM | POA: Diagnosis not present

## 2015-12-03 DIAGNOSIS — I340 Nonrheumatic mitral (valve) insufficiency: Secondary | ICD-10-CM | POA: Diagnosis not present

## 2015-12-03 DIAGNOSIS — J189 Pneumonia, unspecified organism: Secondary | ICD-10-CM | POA: Diagnosis not present

## 2015-12-03 DIAGNOSIS — E785 Hyperlipidemia, unspecified: Secondary | ICD-10-CM | POA: Diagnosis not present

## 2015-12-03 DIAGNOSIS — J9601 Acute respiratory failure with hypoxia: Secondary | ICD-10-CM | POA: Diagnosis not present

## 2015-12-04 DIAGNOSIS — I2510 Atherosclerotic heart disease of native coronary artery without angina pectoris: Secondary | ICD-10-CM | POA: Diagnosis not present

## 2015-12-04 DIAGNOSIS — M109 Gout, unspecified: Secondary | ICD-10-CM | POA: Diagnosis not present

## 2015-12-04 DIAGNOSIS — E785 Hyperlipidemia, unspecified: Secondary | ICD-10-CM | POA: Diagnosis not present

## 2015-12-04 DIAGNOSIS — I4891 Unspecified atrial fibrillation: Secondary | ICD-10-CM | POA: Diagnosis not present

## 2015-12-04 DIAGNOSIS — J189 Pneumonia, unspecified organism: Secondary | ICD-10-CM | POA: Diagnosis not present

## 2015-12-04 DIAGNOSIS — I429 Cardiomyopathy, unspecified: Secondary | ICD-10-CM | POA: Diagnosis not present

## 2015-12-04 DIAGNOSIS — I340 Nonrheumatic mitral (valve) insufficiency: Secondary | ICD-10-CM | POA: Diagnosis not present

## 2015-12-04 DIAGNOSIS — N179 Acute kidney failure, unspecified: Secondary | ICD-10-CM | POA: Diagnosis not present

## 2015-12-04 DIAGNOSIS — J9601 Acute respiratory failure with hypoxia: Secondary | ICD-10-CM | POA: Diagnosis not present

## 2015-12-05 DIAGNOSIS — E785 Hyperlipidemia, unspecified: Secondary | ICD-10-CM | POA: Diagnosis not present

## 2015-12-05 DIAGNOSIS — M109 Gout, unspecified: Secondary | ICD-10-CM | POA: Diagnosis not present

## 2015-12-05 DIAGNOSIS — R42 Dizziness and giddiness: Secondary | ICD-10-CM | POA: Diagnosis not present

## 2015-12-05 DIAGNOSIS — N179 Acute kidney failure, unspecified: Secondary | ICD-10-CM | POA: Diagnosis not present

## 2015-12-05 DIAGNOSIS — I2510 Atherosclerotic heart disease of native coronary artery without angina pectoris: Secondary | ICD-10-CM | POA: Diagnosis not present

## 2015-12-05 DIAGNOSIS — J189 Pneumonia, unspecified organism: Secondary | ICD-10-CM | POA: Diagnosis not present

## 2015-12-05 DIAGNOSIS — G459 Transient cerebral ischemic attack, unspecified: Secondary | ICD-10-CM | POA: Diagnosis not present

## 2015-12-05 DIAGNOSIS — R55 Syncope and collapse: Secondary | ICD-10-CM | POA: Diagnosis not present

## 2015-12-05 DIAGNOSIS — I429 Cardiomyopathy, unspecified: Secondary | ICD-10-CM | POA: Diagnosis not present

## 2015-12-05 DIAGNOSIS — I340 Nonrheumatic mitral (valve) insufficiency: Secondary | ICD-10-CM | POA: Diagnosis not present

## 2015-12-05 DIAGNOSIS — I4891 Unspecified atrial fibrillation: Secondary | ICD-10-CM | POA: Diagnosis not present

## 2015-12-05 DIAGNOSIS — J9601 Acute respiratory failure with hypoxia: Secondary | ICD-10-CM | POA: Diagnosis not present

## 2015-12-10 DIAGNOSIS — N179 Acute kidney failure, unspecified: Secondary | ICD-10-CM | POA: Diagnosis not present

## 2015-12-10 DIAGNOSIS — E785 Hyperlipidemia, unspecified: Secondary | ICD-10-CM | POA: Diagnosis not present

## 2015-12-10 DIAGNOSIS — J189 Pneumonia, unspecified organism: Secondary | ICD-10-CM | POA: Diagnosis not present

## 2015-12-10 DIAGNOSIS — M109 Gout, unspecified: Secondary | ICD-10-CM | POA: Diagnosis not present

## 2015-12-10 DIAGNOSIS — I4891 Unspecified atrial fibrillation: Secondary | ICD-10-CM | POA: Diagnosis not present

## 2015-12-10 DIAGNOSIS — I2510 Atherosclerotic heart disease of native coronary artery without angina pectoris: Secondary | ICD-10-CM | POA: Diagnosis not present

## 2015-12-10 DIAGNOSIS — I340 Nonrheumatic mitral (valve) insufficiency: Secondary | ICD-10-CM | POA: Diagnosis not present

## 2015-12-10 DIAGNOSIS — J9601 Acute respiratory failure with hypoxia: Secondary | ICD-10-CM | POA: Diagnosis not present

## 2015-12-10 DIAGNOSIS — I429 Cardiomyopathy, unspecified: Secondary | ICD-10-CM | POA: Diagnosis not present

## 2015-12-11 DIAGNOSIS — M109 Gout, unspecified: Secondary | ICD-10-CM | POA: Diagnosis not present

## 2015-12-11 DIAGNOSIS — I429 Cardiomyopathy, unspecified: Secondary | ICD-10-CM | POA: Diagnosis not present

## 2015-12-11 DIAGNOSIS — I2510 Atherosclerotic heart disease of native coronary artery without angina pectoris: Secondary | ICD-10-CM | POA: Diagnosis not present

## 2015-12-11 DIAGNOSIS — I340 Nonrheumatic mitral (valve) insufficiency: Secondary | ICD-10-CM | POA: Diagnosis not present

## 2015-12-11 DIAGNOSIS — I4891 Unspecified atrial fibrillation: Secondary | ICD-10-CM | POA: Diagnosis not present

## 2015-12-11 DIAGNOSIS — J9601 Acute respiratory failure with hypoxia: Secondary | ICD-10-CM | POA: Diagnosis not present

## 2015-12-11 DIAGNOSIS — N179 Acute kidney failure, unspecified: Secondary | ICD-10-CM | POA: Diagnosis not present

## 2015-12-11 DIAGNOSIS — E785 Hyperlipidemia, unspecified: Secondary | ICD-10-CM | POA: Diagnosis not present

## 2015-12-11 DIAGNOSIS — J189 Pneumonia, unspecified organism: Secondary | ICD-10-CM | POA: Diagnosis not present

## 2015-12-19 DIAGNOSIS — I2510 Atherosclerotic heart disease of native coronary artery without angina pectoris: Secondary | ICD-10-CM | POA: Diagnosis not present

## 2015-12-19 DIAGNOSIS — N179 Acute kidney failure, unspecified: Secondary | ICD-10-CM | POA: Diagnosis not present

## 2015-12-19 DIAGNOSIS — J9601 Acute respiratory failure with hypoxia: Secondary | ICD-10-CM | POA: Diagnosis not present

## 2015-12-19 DIAGNOSIS — I4891 Unspecified atrial fibrillation: Secondary | ICD-10-CM | POA: Diagnosis not present

## 2015-12-19 DIAGNOSIS — J189 Pneumonia, unspecified organism: Secondary | ICD-10-CM | POA: Diagnosis not present

## 2015-12-19 DIAGNOSIS — M109 Gout, unspecified: Secondary | ICD-10-CM | POA: Diagnosis not present

## 2015-12-19 DIAGNOSIS — E785 Hyperlipidemia, unspecified: Secondary | ICD-10-CM | POA: Diagnosis not present

## 2015-12-19 DIAGNOSIS — I340 Nonrheumatic mitral (valve) insufficiency: Secondary | ICD-10-CM | POA: Diagnosis not present

## 2015-12-19 DIAGNOSIS — I429 Cardiomyopathy, unspecified: Secondary | ICD-10-CM | POA: Diagnosis not present

## 2015-12-26 DIAGNOSIS — M109 Gout, unspecified: Secondary | ICD-10-CM | POA: Diagnosis not present

## 2015-12-26 DIAGNOSIS — E785 Hyperlipidemia, unspecified: Secondary | ICD-10-CM | POA: Diagnosis not present

## 2015-12-26 DIAGNOSIS — I429 Cardiomyopathy, unspecified: Secondary | ICD-10-CM | POA: Diagnosis not present

## 2015-12-26 DIAGNOSIS — I4891 Unspecified atrial fibrillation: Secondary | ICD-10-CM | POA: Diagnosis not present

## 2015-12-26 DIAGNOSIS — I2510 Atherosclerotic heart disease of native coronary artery without angina pectoris: Secondary | ICD-10-CM | POA: Diagnosis not present

## 2015-12-26 DIAGNOSIS — J189 Pneumonia, unspecified organism: Secondary | ICD-10-CM | POA: Diagnosis not present

## 2015-12-26 DIAGNOSIS — J9601 Acute respiratory failure with hypoxia: Secondary | ICD-10-CM | POA: Diagnosis not present

## 2015-12-26 DIAGNOSIS — I340 Nonrheumatic mitral (valve) insufficiency: Secondary | ICD-10-CM | POA: Diagnosis not present

## 2015-12-26 DIAGNOSIS — N179 Acute kidney failure, unspecified: Secondary | ICD-10-CM | POA: Diagnosis not present

## 2016-01-02 DIAGNOSIS — E785 Hyperlipidemia, unspecified: Secondary | ICD-10-CM | POA: Diagnosis not present

## 2016-01-02 DIAGNOSIS — I340 Nonrheumatic mitral (valve) insufficiency: Secondary | ICD-10-CM | POA: Diagnosis not present

## 2016-01-02 DIAGNOSIS — J9601 Acute respiratory failure with hypoxia: Secondary | ICD-10-CM | POA: Diagnosis not present

## 2016-01-02 DIAGNOSIS — I429 Cardiomyopathy, unspecified: Secondary | ICD-10-CM | POA: Diagnosis not present

## 2016-01-02 DIAGNOSIS — I4891 Unspecified atrial fibrillation: Secondary | ICD-10-CM | POA: Diagnosis not present

## 2016-01-02 DIAGNOSIS — J189 Pneumonia, unspecified organism: Secondary | ICD-10-CM | POA: Diagnosis not present

## 2016-01-02 DIAGNOSIS — M4806 Spinal stenosis, lumbar region: Secondary | ICD-10-CM | POA: Diagnosis not present

## 2016-01-02 DIAGNOSIS — M109 Gout, unspecified: Secondary | ICD-10-CM | POA: Diagnosis not present

## 2016-01-02 DIAGNOSIS — J440 Chronic obstructive pulmonary disease with acute lower respiratory infection: Secondary | ICD-10-CM | POA: Diagnosis not present

## 2016-01-02 DIAGNOSIS — I2510 Atherosclerotic heart disease of native coronary artery without angina pectoris: Secondary | ICD-10-CM | POA: Diagnosis not present

## 2016-01-02 DIAGNOSIS — N179 Acute kidney failure, unspecified: Secondary | ICD-10-CM | POA: Diagnosis not present

## 2016-01-11 DIAGNOSIS — J189 Pneumonia, unspecified organism: Secondary | ICD-10-CM | POA: Diagnosis not present

## 2016-01-11 DIAGNOSIS — M109 Gout, unspecified: Secondary | ICD-10-CM | POA: Diagnosis not present

## 2016-01-11 DIAGNOSIS — I4891 Unspecified atrial fibrillation: Secondary | ICD-10-CM | POA: Diagnosis not present

## 2016-01-11 DIAGNOSIS — J9601 Acute respiratory failure with hypoxia: Secondary | ICD-10-CM | POA: Diagnosis not present

## 2016-01-11 DIAGNOSIS — N179 Acute kidney failure, unspecified: Secondary | ICD-10-CM | POA: Diagnosis not present

## 2016-01-11 DIAGNOSIS — I340 Nonrheumatic mitral (valve) insufficiency: Secondary | ICD-10-CM | POA: Diagnosis not present

## 2016-01-11 DIAGNOSIS — I429 Cardiomyopathy, unspecified: Secondary | ICD-10-CM | POA: Diagnosis not present

## 2016-01-11 DIAGNOSIS — E785 Hyperlipidemia, unspecified: Secondary | ICD-10-CM | POA: Diagnosis not present

## 2016-01-11 DIAGNOSIS — I2510 Atherosclerotic heart disease of native coronary artery without angina pectoris: Secondary | ICD-10-CM | POA: Diagnosis not present

## 2016-02-01 DIAGNOSIS — G459 Transient cerebral ischemic attack, unspecified: Secondary | ICD-10-CM | POA: Diagnosis not present

## 2016-02-01 DIAGNOSIS — I4891 Unspecified atrial fibrillation: Secondary | ICD-10-CM | POA: Diagnosis not present

## 2016-02-01 DIAGNOSIS — M4806 Spinal stenosis, lumbar region: Secondary | ICD-10-CM | POA: Diagnosis not present

## 2016-02-01 DIAGNOSIS — T360X5S Adverse effect of penicillins, sequela: Secondary | ICD-10-CM | POA: Diagnosis not present

## 2016-02-18 DIAGNOSIS — D649 Anemia, unspecified: Secondary | ICD-10-CM | POA: Diagnosis not present

## 2016-02-18 DIAGNOSIS — I10 Essential (primary) hypertension: Secondary | ICD-10-CM | POA: Diagnosis not present

## 2016-02-18 DIAGNOSIS — I482 Chronic atrial fibrillation: Secondary | ICD-10-CM | POA: Diagnosis not present

## 2016-02-18 DIAGNOSIS — E785 Hyperlipidemia, unspecified: Secondary | ICD-10-CM | POA: Diagnosis not present

## 2016-02-18 DIAGNOSIS — E538 Deficiency of other specified B group vitamins: Secondary | ICD-10-CM | POA: Diagnosis not present

## 2016-02-18 DIAGNOSIS — M1000 Idiopathic gout, unspecified site: Secondary | ICD-10-CM | POA: Diagnosis not present

## 2016-02-22 DIAGNOSIS — E538 Deficiency of other specified B group vitamins: Secondary | ICD-10-CM | POA: Diagnosis not present

## 2016-02-29 DIAGNOSIS — E538 Deficiency of other specified B group vitamins: Secondary | ICD-10-CM | POA: Diagnosis not present

## 2016-03-03 DIAGNOSIS — G459 Transient cerebral ischemic attack, unspecified: Secondary | ICD-10-CM | POA: Diagnosis not present

## 2016-03-03 DIAGNOSIS — I4891 Unspecified atrial fibrillation: Secondary | ICD-10-CM | POA: Diagnosis not present

## 2016-03-03 DIAGNOSIS — M4806 Spinal stenosis, lumbar region: Secondary | ICD-10-CM | POA: Diagnosis not present

## 2016-03-08 IMAGING — CT CT HEAD W/O CM
1 series · 16 of 30 positions shown, 20 images · non-contrast
Comparison: Brain CT 10/09/2012.

CLINICAL DATA: Patient with cough and altered mental status.

EXAM:
CT HEAD WITHOUT CONTRAST
TECHNIQUE: Contiguous axial images were obtained from the base of the skull
through the vertex without intravenous contrast.

[Series 2: head wo · axial · 0.47mm/px · z∈[-106,+38]mm · 16 of 36 slices shown, 20 images]
[im 2/36  brain]
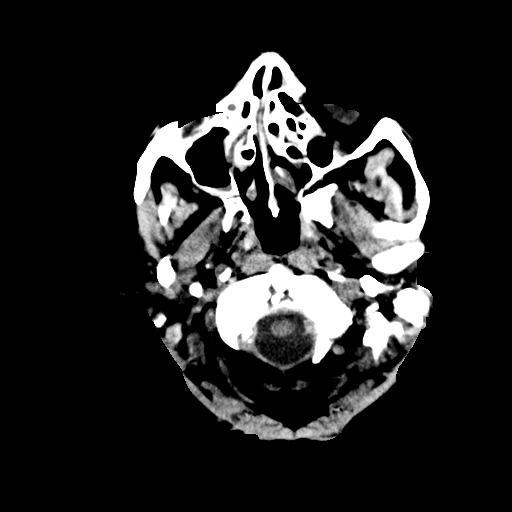
[im 2/36  bone]
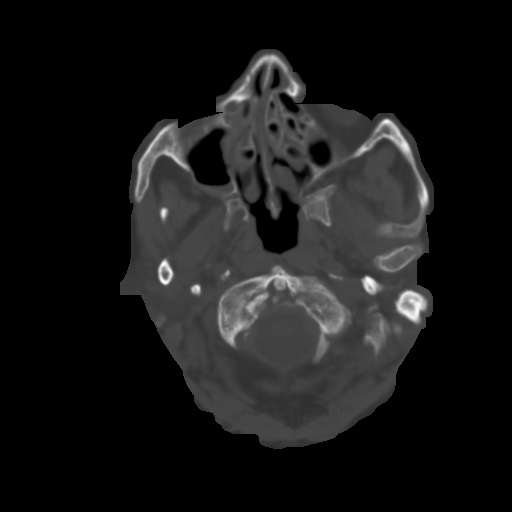
[im 4/36  brain]
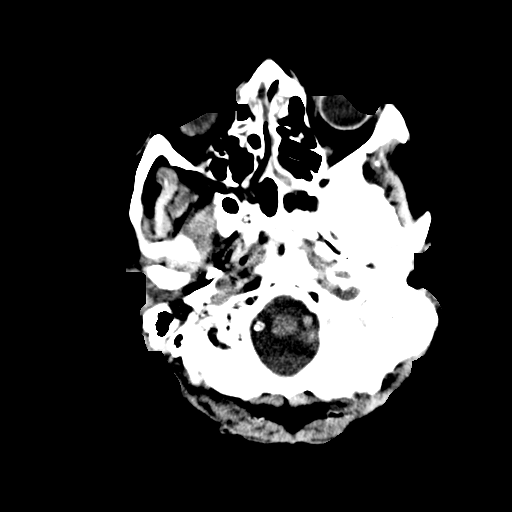
[im 7/36  brain]
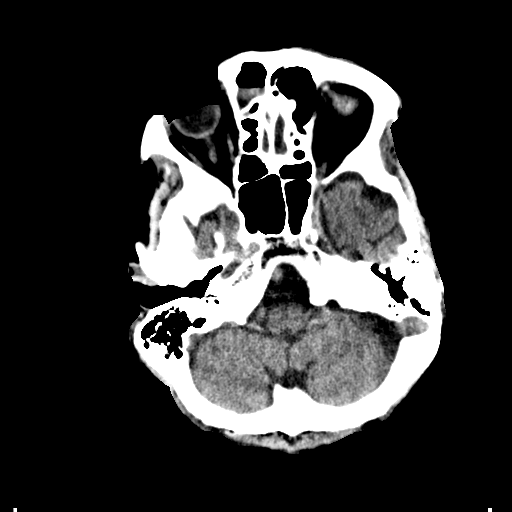
[im 9/36  brain]
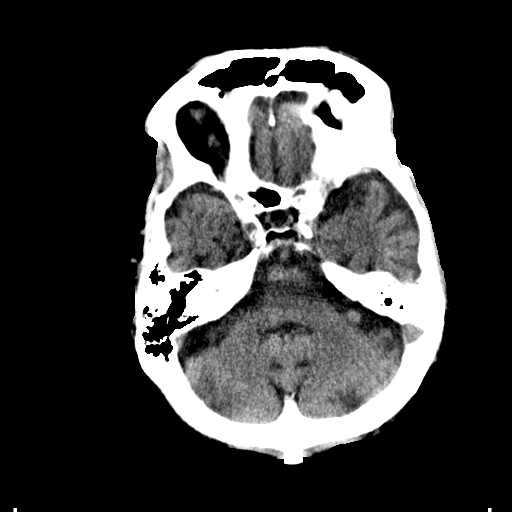
[im 10/36  brain]
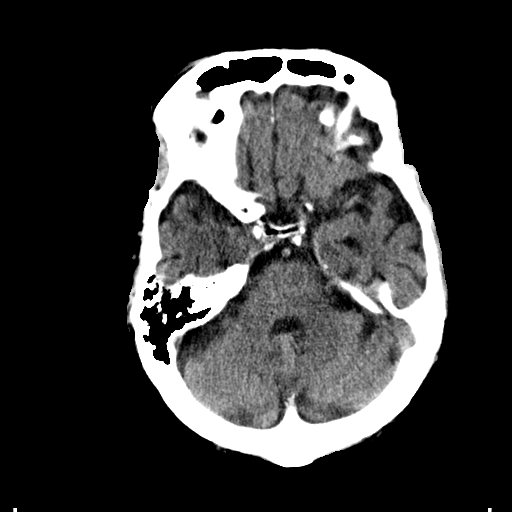
[im 10/36  bone]
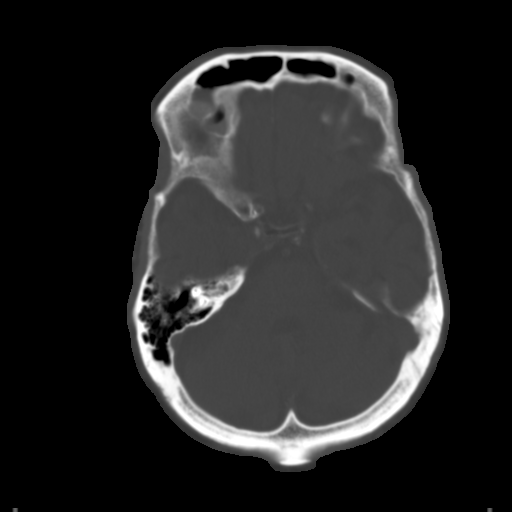
[im 13/36  brain]
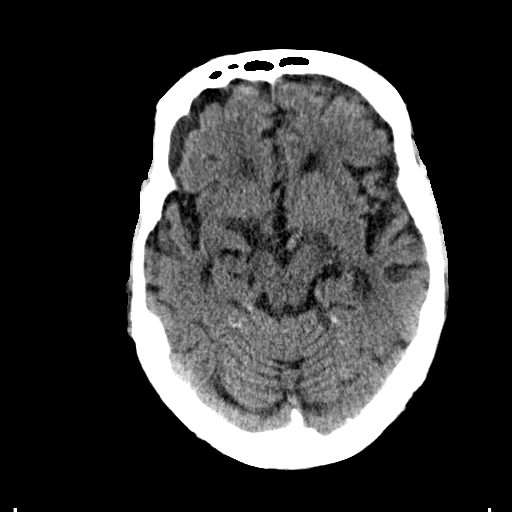
[im 15/36  brain]
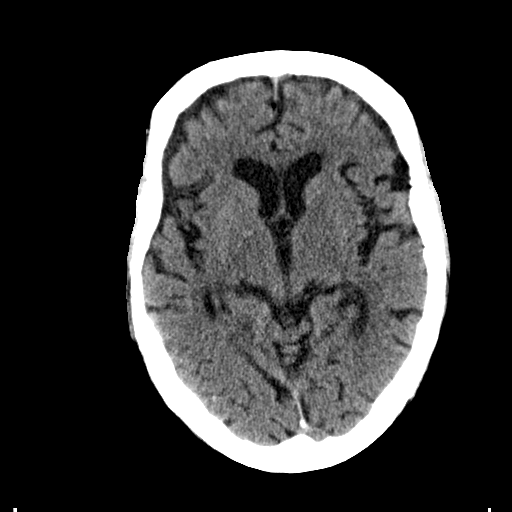
[im 17/36  brain]
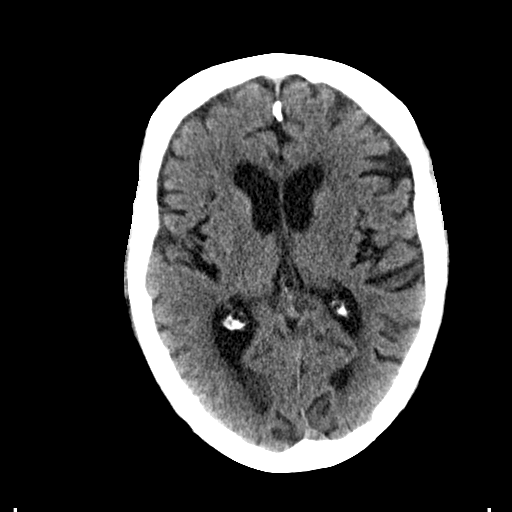
[im 19/36  brain]
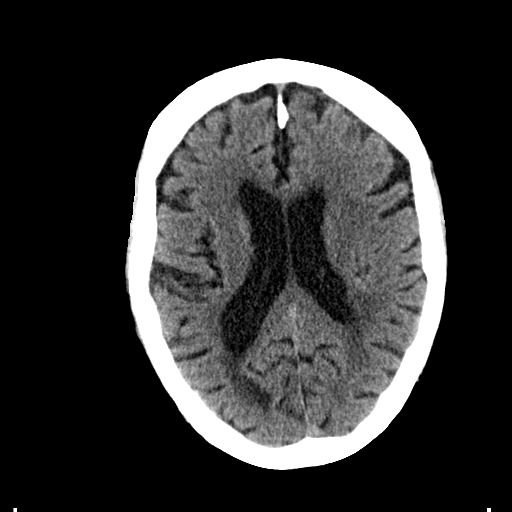
[im 19/36  bone]
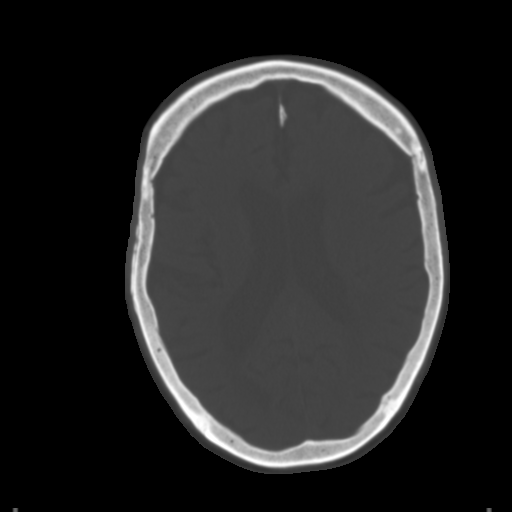
[im 21/36  brain]
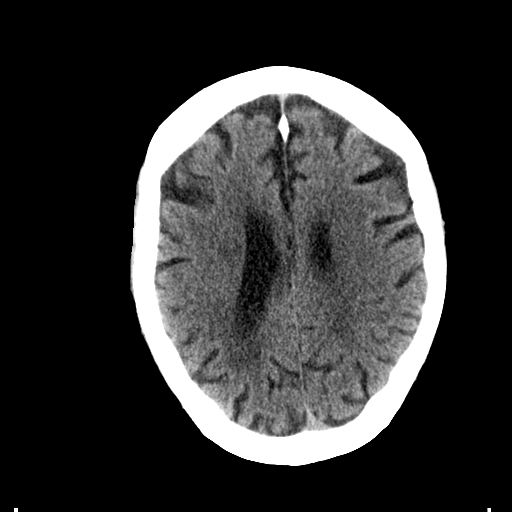
[im 23/36  brain]
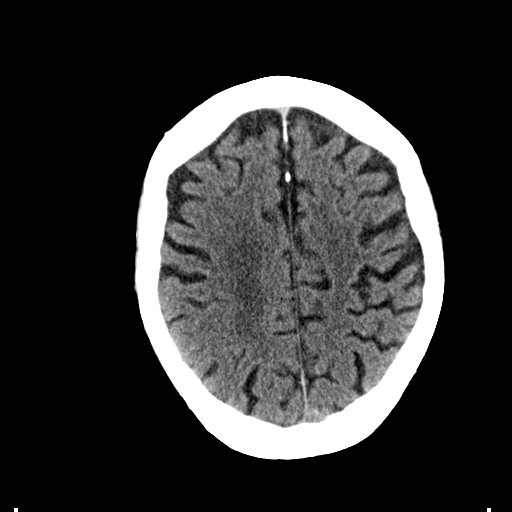
[im 26/36  brain]
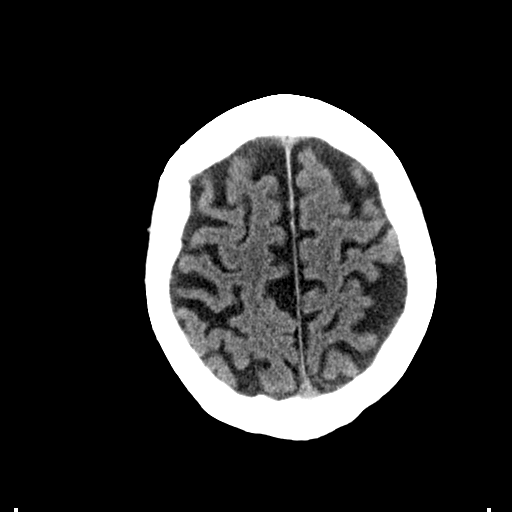
[im 27/36  brain]
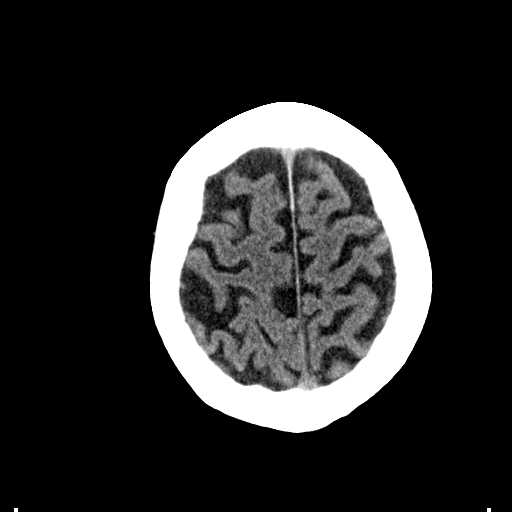
[im 27/36  bone]
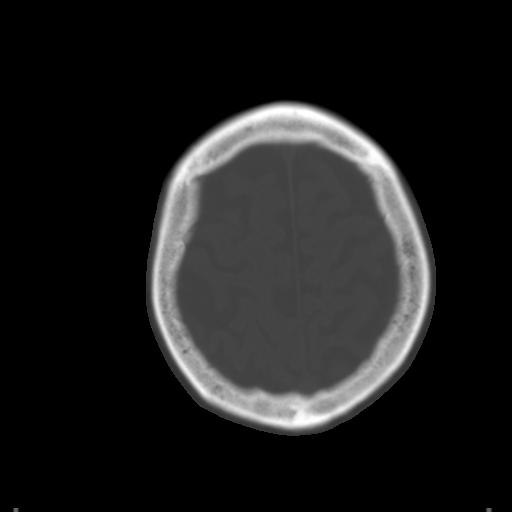
[im 29/36  brain]
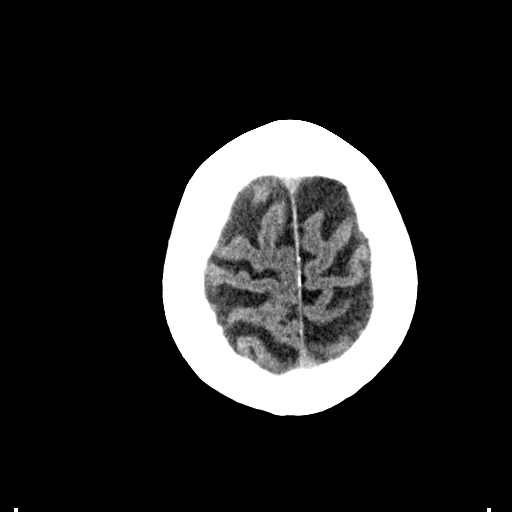
[im 32/36  brain]
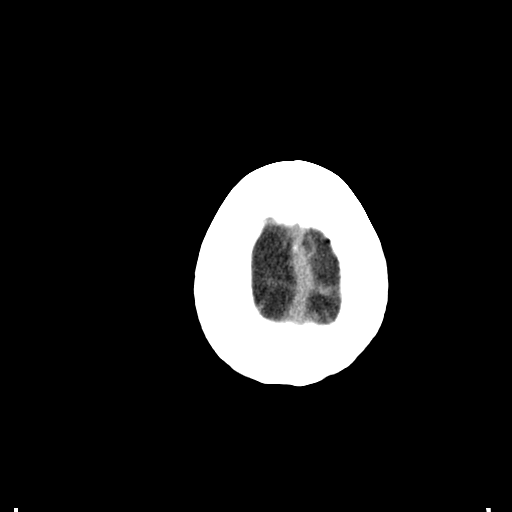
[im 34/36  brain]
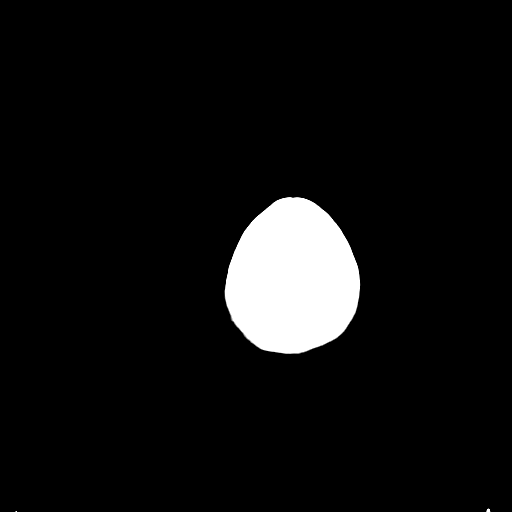

[16 of 30 positions shown; findings below may reference images not displayed]

FINDINGS: Ventricles and sulci are prominent compatible with atrophy. No
evidence for acute cortically based infarct, intracranial
hemorrhage, mass lesion or mass-effect. Orbits are unremarkable.
Air-fluid levels demonstrated within frontal sinus, ethmoid air
cells bilateral maxillary sinuses. Mastoid air cells are
unremarkable. Calvarium is intact.
IMPRESSION: No acute intracranial process.

Cortical atrophy.

Air-fluid levels within the paranasal sinuses most compatible with
acute sinusitis.

## 2016-03-08 IMAGING — CR DG CHEST 1V
1 series · 1 of 1 positions shown · non-contrast
Comparison: 10/08/2012

CLINICAL DATA: Cough, 1 week duration.

EXAM:
CHEST 1 VIEW

[ap]
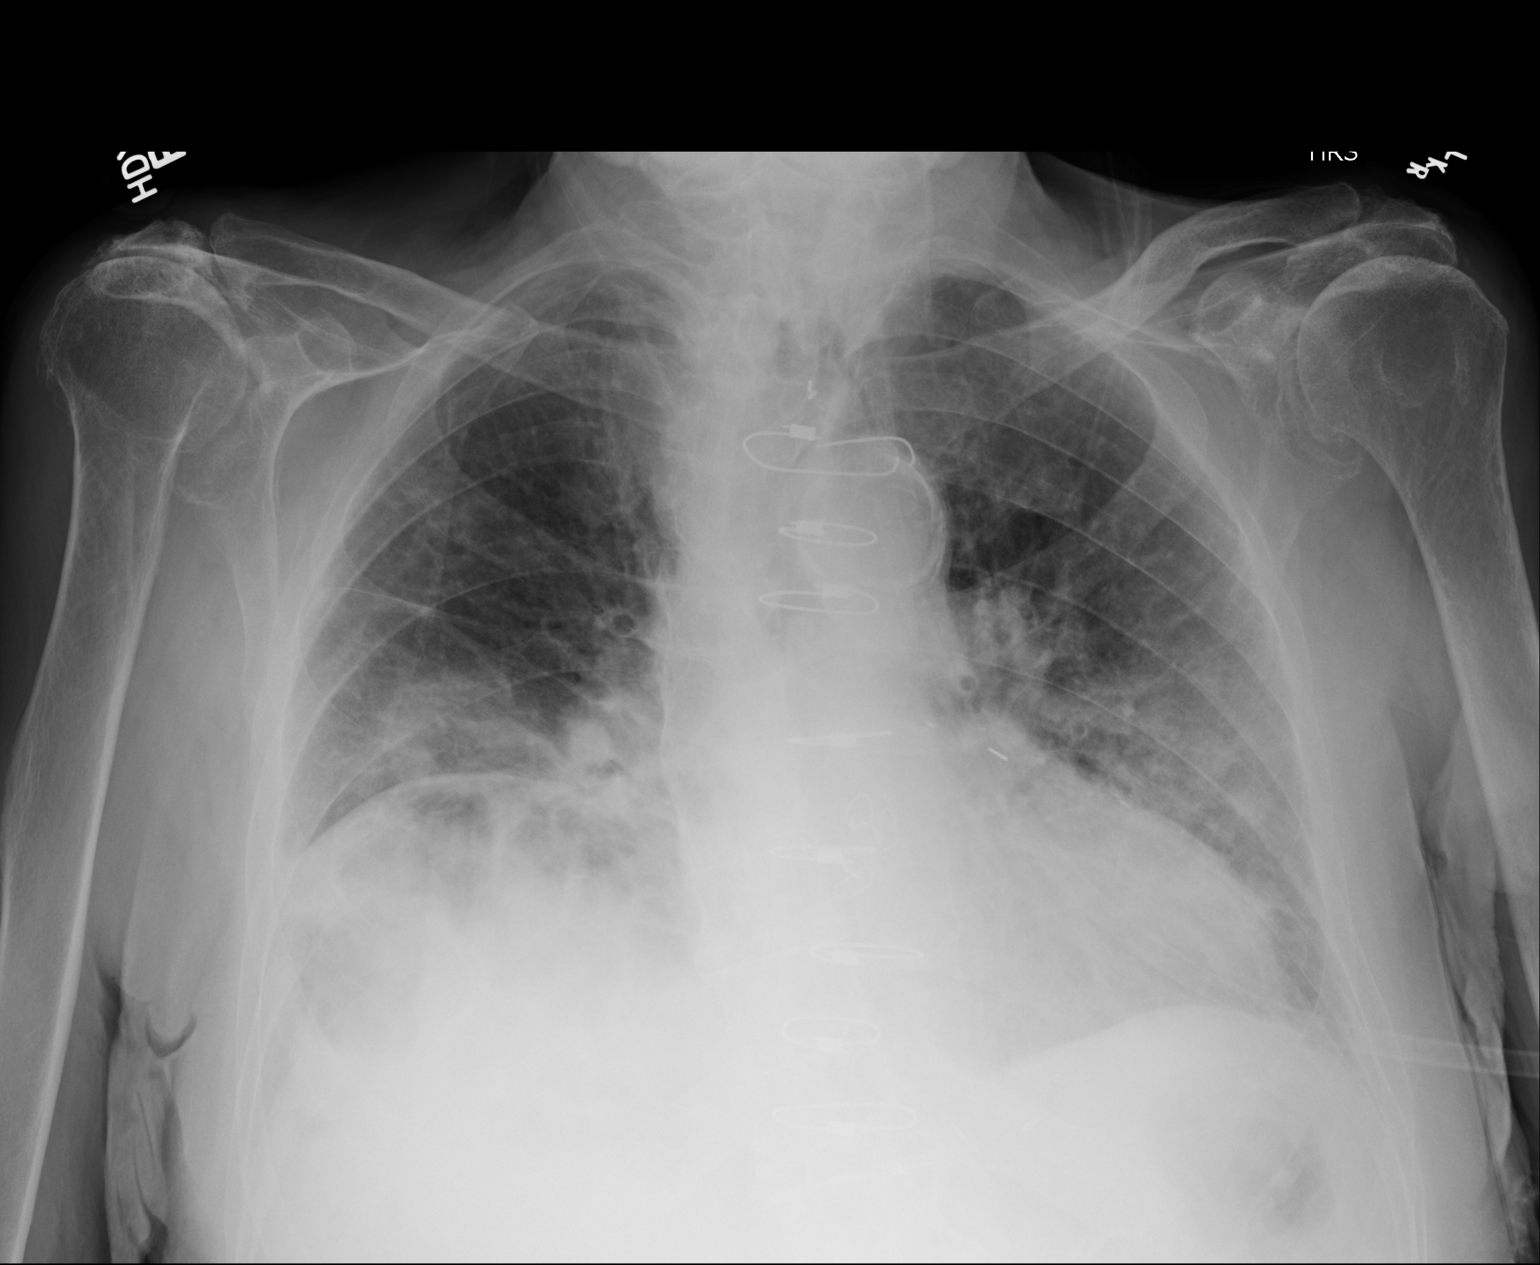

[1 of 1 positions shown; findings below may reference images not displayed]

FINDINGS: Previous median sternotomy and CABG. Heart size is normal.
Atherosclerosis of the aorta. There are extensive infiltrates in
both lower lobes consistent with bronchopneumonia. Upper lungs are
clear. No effusions. Chronic elevation of the right hemidiaphragm.
IMPRESSION: Bilateral lower lobe bronchopneumonia.

## 2016-03-14 DIAGNOSIS — E538 Deficiency of other specified B group vitamins: Secondary | ICD-10-CM | POA: Diagnosis not present

## 2016-04-04 DIAGNOSIS — I4891 Unspecified atrial fibrillation: Secondary | ICD-10-CM | POA: Diagnosis not present

## 2016-05-19 DIAGNOSIS — I4891 Unspecified atrial fibrillation: Secondary | ICD-10-CM | POA: Diagnosis not present

## 2016-05-19 DIAGNOSIS — M4806 Spinal stenosis, lumbar region: Secondary | ICD-10-CM | POA: Diagnosis not present

## 2016-05-19 DIAGNOSIS — G459 Transient cerebral ischemic attack, unspecified: Secondary | ICD-10-CM | POA: Diagnosis not present

## 2016-05-19 DIAGNOSIS — T360X5S Adverse effect of penicillins, sequela: Secondary | ICD-10-CM | POA: Diagnosis not present

## 2016-06-02 DIAGNOSIS — L570 Actinic keratosis: Secondary | ICD-10-CM | POA: Diagnosis not present

## 2016-06-02 DIAGNOSIS — L578 Other skin changes due to chronic exposure to nonionizing radiation: Secondary | ICD-10-CM | POA: Diagnosis not present

## 2016-06-02 DIAGNOSIS — D692 Other nonthrombocytopenic purpura: Secondary | ICD-10-CM | POA: Diagnosis not present

## 2016-06-02 DIAGNOSIS — L821 Other seborrheic keratosis: Secondary | ICD-10-CM | POA: Diagnosis not present

## 2016-06-18 DIAGNOSIS — I4891 Unspecified atrial fibrillation: Secondary | ICD-10-CM | POA: Diagnosis not present

## 2016-06-18 DIAGNOSIS — R55 Syncope and collapse: Secondary | ICD-10-CM | POA: Diagnosis not present

## 2016-07-16 DIAGNOSIS — G459 Transient cerebral ischemic attack, unspecified: Secondary | ICD-10-CM | POA: Diagnosis not present

## 2016-07-16 DIAGNOSIS — T360X5S Adverse effect of penicillins, sequela: Secondary | ICD-10-CM | POA: Diagnosis not present

## 2016-07-16 DIAGNOSIS — M4806 Spinal stenosis, lumbar region: Secondary | ICD-10-CM | POA: Diagnosis not present

## 2016-07-16 DIAGNOSIS — I4891 Unspecified atrial fibrillation: Secondary | ICD-10-CM | POA: Diagnosis not present

## 2016-08-19 DIAGNOSIS — I10 Essential (primary) hypertension: Secondary | ICD-10-CM | POA: Diagnosis not present

## 2016-08-19 DIAGNOSIS — D649 Anemia, unspecified: Secondary | ICD-10-CM | POA: Diagnosis not present

## 2016-08-19 DIAGNOSIS — E785 Hyperlipidemia, unspecified: Secondary | ICD-10-CM | POA: Diagnosis not present

## 2016-08-19 DIAGNOSIS — E538 Deficiency of other specified B group vitamins: Secondary | ICD-10-CM | POA: Diagnosis not present

## 2016-08-21 DIAGNOSIS — Z7901 Long term (current) use of anticoagulants: Secondary | ICD-10-CM | POA: Diagnosis not present

## 2016-08-21 DIAGNOSIS — Z23 Encounter for immunization: Secondary | ICD-10-CM | POA: Diagnosis not present

## 2016-11-05 DIAGNOSIS — J019 Acute sinusitis, unspecified: Secondary | ICD-10-CM | POA: Diagnosis not present

## 2016-11-05 DIAGNOSIS — B9689 Other specified bacterial agents as the cause of diseases classified elsewhere: Secondary | ICD-10-CM | POA: Diagnosis not present

## 2017-02-17 DIAGNOSIS — R600 Localized edema: Secondary | ICD-10-CM | POA: Diagnosis not present

## 2017-02-17 DIAGNOSIS — D649 Anemia, unspecified: Secondary | ICD-10-CM | POA: Diagnosis not present

## 2017-02-17 DIAGNOSIS — I10 Essential (primary) hypertension: Secondary | ICD-10-CM | POA: Diagnosis not present

## 2017-02-17 DIAGNOSIS — L989 Disorder of the skin and subcutaneous tissue, unspecified: Secondary | ICD-10-CM | POA: Diagnosis not present

## 2017-02-23 DIAGNOSIS — M1990 Unspecified osteoarthritis, unspecified site: Secondary | ICD-10-CM | POA: Diagnosis not present

## 2017-02-23 DIAGNOSIS — I10 Essential (primary) hypertension: Secondary | ICD-10-CM | POA: Diagnosis not present

## 2017-02-23 DIAGNOSIS — I2510 Atherosclerotic heart disease of native coronary artery without angina pectoris: Secondary | ICD-10-CM | POA: Diagnosis not present

## 2017-02-23 DIAGNOSIS — M810 Age-related osteoporosis without current pathological fracture: Secondary | ICD-10-CM | POA: Diagnosis not present

## 2017-02-23 DIAGNOSIS — M109 Gout, unspecified: Secondary | ICD-10-CM | POA: Diagnosis not present

## 2017-02-23 DIAGNOSIS — R32 Unspecified urinary incontinence: Secondary | ICD-10-CM | POA: Diagnosis not present

## 2017-02-23 DIAGNOSIS — M4800 Spinal stenosis, site unspecified: Secondary | ICD-10-CM | POA: Diagnosis not present

## 2017-02-23 DIAGNOSIS — I252 Old myocardial infarction: Secondary | ICD-10-CM | POA: Diagnosis not present

## 2017-02-23 DIAGNOSIS — R600 Localized edema: Secondary | ICD-10-CM | POA: Diagnosis not present

## 2017-03-02 DIAGNOSIS — R32 Unspecified urinary incontinence: Secondary | ICD-10-CM | POA: Diagnosis not present

## 2017-03-02 DIAGNOSIS — M1990 Unspecified osteoarthritis, unspecified site: Secondary | ICD-10-CM | POA: Diagnosis not present

## 2017-03-02 DIAGNOSIS — R600 Localized edema: Secondary | ICD-10-CM | POA: Diagnosis not present

## 2017-03-02 DIAGNOSIS — I2510 Atherosclerotic heart disease of native coronary artery without angina pectoris: Secondary | ICD-10-CM | POA: Diagnosis not present

## 2017-03-02 DIAGNOSIS — I10 Essential (primary) hypertension: Secondary | ICD-10-CM | POA: Diagnosis not present

## 2017-03-02 DIAGNOSIS — M4800 Spinal stenosis, site unspecified: Secondary | ICD-10-CM | POA: Diagnosis not present

## 2017-03-02 DIAGNOSIS — I252 Old myocardial infarction: Secondary | ICD-10-CM | POA: Diagnosis not present

## 2017-03-02 DIAGNOSIS — M810 Age-related osteoporosis without current pathological fracture: Secondary | ICD-10-CM | POA: Diagnosis not present

## 2017-03-02 DIAGNOSIS — M109 Gout, unspecified: Secondary | ICD-10-CM | POA: Diagnosis not present

## 2017-03-05 DIAGNOSIS — R32 Unspecified urinary incontinence: Secondary | ICD-10-CM | POA: Diagnosis not present

## 2017-03-05 DIAGNOSIS — M109 Gout, unspecified: Secondary | ICD-10-CM | POA: Diagnosis not present

## 2017-03-05 DIAGNOSIS — M4800 Spinal stenosis, site unspecified: Secondary | ICD-10-CM | POA: Diagnosis not present

## 2017-03-05 DIAGNOSIS — M1990 Unspecified osteoarthritis, unspecified site: Secondary | ICD-10-CM | POA: Diagnosis not present

## 2017-03-05 DIAGNOSIS — I252 Old myocardial infarction: Secondary | ICD-10-CM | POA: Diagnosis not present

## 2017-03-05 DIAGNOSIS — M810 Age-related osteoporosis without current pathological fracture: Secondary | ICD-10-CM | POA: Diagnosis not present

## 2017-03-05 DIAGNOSIS — I2510 Atherosclerotic heart disease of native coronary artery without angina pectoris: Secondary | ICD-10-CM | POA: Diagnosis not present

## 2017-03-05 DIAGNOSIS — R600 Localized edema: Secondary | ICD-10-CM | POA: Diagnosis not present

## 2017-03-05 DIAGNOSIS — I10 Essential (primary) hypertension: Secondary | ICD-10-CM | POA: Diagnosis not present

## 2017-03-09 DIAGNOSIS — M810 Age-related osteoporosis without current pathological fracture: Secondary | ICD-10-CM | POA: Diagnosis not present

## 2017-03-09 DIAGNOSIS — I2510 Atherosclerotic heart disease of native coronary artery without angina pectoris: Secondary | ICD-10-CM | POA: Diagnosis not present

## 2017-03-09 DIAGNOSIS — R600 Localized edema: Secondary | ICD-10-CM | POA: Diagnosis not present

## 2017-03-09 DIAGNOSIS — R32 Unspecified urinary incontinence: Secondary | ICD-10-CM | POA: Diagnosis not present

## 2017-03-09 DIAGNOSIS — M4800 Spinal stenosis, site unspecified: Secondary | ICD-10-CM | POA: Diagnosis not present

## 2017-03-09 DIAGNOSIS — M1990 Unspecified osteoarthritis, unspecified site: Secondary | ICD-10-CM | POA: Diagnosis not present

## 2017-03-09 DIAGNOSIS — M109 Gout, unspecified: Secondary | ICD-10-CM | POA: Diagnosis not present

## 2017-03-09 DIAGNOSIS — I252 Old myocardial infarction: Secondary | ICD-10-CM | POA: Diagnosis not present

## 2017-03-09 DIAGNOSIS — I10 Essential (primary) hypertension: Secondary | ICD-10-CM | POA: Diagnosis not present

## 2017-03-13 DIAGNOSIS — M1990 Unspecified osteoarthritis, unspecified site: Secondary | ICD-10-CM | POA: Diagnosis not present

## 2017-03-13 DIAGNOSIS — M109 Gout, unspecified: Secondary | ICD-10-CM | POA: Diagnosis not present

## 2017-03-13 DIAGNOSIS — M810 Age-related osteoporosis without current pathological fracture: Secondary | ICD-10-CM | POA: Diagnosis not present

## 2017-03-13 DIAGNOSIS — R600 Localized edema: Secondary | ICD-10-CM | POA: Diagnosis not present

## 2017-03-13 DIAGNOSIS — I252 Old myocardial infarction: Secondary | ICD-10-CM | POA: Diagnosis not present

## 2017-03-13 DIAGNOSIS — M4800 Spinal stenosis, site unspecified: Secondary | ICD-10-CM | POA: Diagnosis not present

## 2017-03-13 DIAGNOSIS — R32 Unspecified urinary incontinence: Secondary | ICD-10-CM | POA: Diagnosis not present

## 2017-03-13 DIAGNOSIS — I10 Essential (primary) hypertension: Secondary | ICD-10-CM | POA: Diagnosis not present

## 2017-03-13 DIAGNOSIS — I2510 Atherosclerotic heart disease of native coronary artery without angina pectoris: Secondary | ICD-10-CM | POA: Diagnosis not present

## 2017-06-22 DIAGNOSIS — D649 Anemia, unspecified: Secondary | ICD-10-CM | POA: Diagnosis not present

## 2017-06-22 DIAGNOSIS — E785 Hyperlipidemia, unspecified: Secondary | ICD-10-CM | POA: Diagnosis not present

## 2017-06-22 DIAGNOSIS — E538 Deficiency of other specified B group vitamins: Secondary | ICD-10-CM | POA: Diagnosis not present

## 2017-06-22 DIAGNOSIS — I10 Essential (primary) hypertension: Secondary | ICD-10-CM | POA: Diagnosis not present

## 2017-06-22 DIAGNOSIS — R238 Other skin changes: Secondary | ICD-10-CM | POA: Diagnosis not present

## 2017-09-04 DIAGNOSIS — Z23 Encounter for immunization: Secondary | ICD-10-CM | POA: Diagnosis not present

## 2017-09-24 ENCOUNTER — Inpatient Hospital Stay
Admission: EM | Admit: 2017-09-24 | Discharge: 2017-09-25 | DRG: 291 | Disposition: A | Discharge status: Hospice | Payer: Medicare HMO | Attending: Internal Medicine | Admitting: Internal Medicine

## 2017-09-24 ENCOUNTER — Inpatient Hospital Stay
Admit: 2017-09-24 | Discharge: 2017-09-24 | Disposition: A | Discharge status: Home / Self Care | Payer: Medicare HMO | Attending: Internal Medicine | Admitting: Internal Medicine

## 2017-09-24 ENCOUNTER — Encounter: Payer: Self-pay | Admitting: Emergency Medicine

## 2017-09-24 ENCOUNTER — Other Ambulatory Visit: Payer: Self-pay

## 2017-09-24 ENCOUNTER — Emergency Department: Payer: Medicare HMO

## 2017-09-24 DIAGNOSIS — I340 Nonrheumatic mitral (valve) insufficiency: Secondary | ICD-10-CM | POA: Diagnosis present

## 2017-09-24 DIAGNOSIS — E872 Acidosis: Secondary | ICD-10-CM | POA: Diagnosis not present

## 2017-09-24 DIAGNOSIS — I429 Cardiomyopathy, unspecified: Secondary | ICD-10-CM | POA: Diagnosis present

## 2017-09-24 DIAGNOSIS — I5033 Acute on chronic diastolic (congestive) heart failure: Secondary | ICD-10-CM | POA: Diagnosis not present

## 2017-09-24 DIAGNOSIS — R0600 Dyspnea, unspecified: Secondary | ICD-10-CM | POA: Diagnosis not present

## 2017-09-24 DIAGNOSIS — I110 Hypertensive heart disease with heart failure: Principal | ICD-10-CM | POA: Diagnosis present

## 2017-09-24 DIAGNOSIS — Z7401 Bed confinement status: Secondary | ICD-10-CM | POA: Diagnosis not present

## 2017-09-24 DIAGNOSIS — I509 Heart failure, unspecified: Secondary | ICD-10-CM | POA: Diagnosis not present

## 2017-09-24 DIAGNOSIS — Z951 Presence of aortocoronary bypass graft: Secondary | ICD-10-CM

## 2017-09-24 DIAGNOSIS — Z7901 Long term (current) use of anticoagulants: Secondary | ICD-10-CM | POA: Diagnosis not present

## 2017-09-24 DIAGNOSIS — Z66 Do not resuscitate: Secondary | ICD-10-CM | POA: Diagnosis not present

## 2017-09-24 DIAGNOSIS — Z79899 Other long term (current) drug therapy: Secondary | ICD-10-CM

## 2017-09-24 DIAGNOSIS — R627 Adult failure to thrive: Secondary | ICD-10-CM | POA: Diagnosis not present

## 2017-09-24 DIAGNOSIS — J810 Acute pulmonary edema: Secondary | ICD-10-CM

## 2017-09-24 DIAGNOSIS — E785 Hyperlipidemia, unspecified: Secondary | ICD-10-CM | POA: Diagnosis present

## 2017-09-24 DIAGNOSIS — Z7189 Other specified counseling: Secondary | ICD-10-CM | POA: Diagnosis not present

## 2017-09-24 DIAGNOSIS — Z880 Allergy status to penicillin: Secondary | ICD-10-CM

## 2017-09-24 DIAGNOSIS — Z888 Allergy status to other drugs, medicaments and biological substances status: Secondary | ICD-10-CM

## 2017-09-24 DIAGNOSIS — Z8673 Personal history of transient ischemic attack (TIA), and cerebral infarction without residual deficits: Secondary | ICD-10-CM | POA: Diagnosis not present

## 2017-09-24 DIAGNOSIS — M109 Gout, unspecified: Secondary | ICD-10-CM | POA: Diagnosis present

## 2017-09-24 DIAGNOSIS — I2510 Atherosclerotic heart disease of native coronary artery without angina pectoris: Secondary | ICD-10-CM | POA: Diagnosis not present

## 2017-09-24 DIAGNOSIS — J9601 Acute respiratory failure with hypoxia: Secondary | ICD-10-CM

## 2017-09-24 DIAGNOSIS — Z515 Encounter for palliative care: Secondary | ICD-10-CM | POA: Diagnosis not present

## 2017-09-24 DIAGNOSIS — E860 Dehydration: Secondary | ICD-10-CM | POA: Diagnosis not present

## 2017-09-24 DIAGNOSIS — R0602 Shortness of breath: Secondary | ICD-10-CM | POA: Diagnosis not present

## 2017-09-24 DIAGNOSIS — I10 Essential (primary) hypertension: Secondary | ICD-10-CM | POA: Diagnosis not present

## 2017-09-24 DIAGNOSIS — Z9911 Dependence on respirator [ventilator] status: Secondary | ICD-10-CM | POA: Diagnosis not present

## 2017-09-24 LAB — CBC WITH DIFFERENTIAL/PLATELET
BASOS ABS: 0 10*3/uL (ref 0–0.1)
Basophils Relative: 1 %
Eosinophils Absolute: 0 10*3/uL (ref 0–0.7)
Eosinophils Relative: 0 %
HEMATOCRIT: 33.9 % — AB (ref 40.0–52.0)
HEMOGLOBIN: 11.4 g/dL — AB (ref 13.0–18.0)
LYMPHS PCT: 15 %
Lymphs Abs: 1 10*3/uL (ref 1.0–3.6)
MCH: 37.2 pg — ABNORMAL HIGH (ref 26.0–34.0)
MCHC: 33.7 g/dL (ref 32.0–36.0)
MCV: 110.4 fL — AB (ref 80.0–100.0)
Monocytes Absolute: 0.6 10*3/uL (ref 0.2–1.0)
Monocytes Relative: 10 %
NEUTROS ABS: 5.1 10*3/uL (ref 1.4–6.5)
NEUTROS PCT: 74 %
Platelets: 159 10*3/uL (ref 150–440)
RBC: 3.07 MIL/uL — AB (ref 4.40–5.90)
RDW: 15 % — ABNORMAL HIGH (ref 11.5–14.5)
WBC: 6.9 10*3/uL (ref 3.8–10.6)

## 2017-09-24 LAB — CBC
HEMATOCRIT: 34.2 % — AB (ref 40.0–52.0)
HEMOGLOBIN: 11.2 g/dL — AB (ref 13.0–18.0)
MCH: 36.1 pg — ABNORMAL HIGH (ref 26.0–34.0)
MCHC: 32.8 g/dL (ref 32.0–36.0)
MCV: 110.1 fL — ABNORMAL HIGH (ref 80.0–100.0)
Platelets: 161 10*3/uL (ref 150–440)
RBC: 3.11 MIL/uL — AB (ref 4.40–5.90)
RDW: 14.9 % — AB (ref 11.5–14.5)
WBC: 6.9 10*3/uL (ref 3.8–10.6)

## 2017-09-24 LAB — COMPREHENSIVE METABOLIC PANEL
ALBUMIN: 3.8 g/dL (ref 3.5–5.0)
ALT: 14 U/L — AB (ref 17–63)
AST: 26 U/L (ref 15–41)
Alkaline Phosphatase: 67 U/L (ref 38–126)
Anion gap: 12 (ref 5–15)
BUN: 41 mg/dL — AB (ref 6–20)
CHLORIDE: 103 mmol/L (ref 101–111)
CO2: 24 mmol/L (ref 22–32)
CREATININE: 1.45 mg/dL — AB (ref 0.61–1.24)
Calcium: 9 mg/dL (ref 8.9–10.3)
GFR calc Af Amer: 45 mL/min — ABNORMAL LOW (ref >60)
GFR, EST NON AFRICAN AMERICAN: 39 mL/min — AB (ref >60)
GLUCOSE: 163 mg/dL — AB (ref 65–99)
Potassium: 4.3 mmol/L (ref 3.5–5.1)
Sodium: 139 mmol/L (ref 135–145)
Total Bilirubin: 1.6 mg/dL — ABNORMAL HIGH (ref 0.3–1.2)
Total Protein: 6.8 g/dL (ref 6.5–8.1)

## 2017-09-24 LAB — TROPONIN I: Troponin I: <0.03 ng/mL (ref <0.03)

## 2017-09-24 LAB — CREATININE, SERUM
Creatinine, Ser: 1.58 mg/dL — ABNORMAL HIGH (ref 0.61–1.24)
GFR, EST AFRICAN AMERICAN: 41 mL/min — AB (ref >60)
GFR, EST NON AFRICAN AMERICAN: 35 mL/min — AB (ref >60)

## 2017-09-24 LAB — LACTIC ACID, PLASMA
LACTIC ACID, VENOUS: 1.5 mmol/L (ref 0.5–1.9)
Lactic Acid, Venous: 2.3 mmol/L (ref 0.5–1.9)

## 2017-09-24 LAB — LIPASE, BLOOD: LIPASE: 20 U/L (ref 11–51)

## 2017-09-24 LAB — BRAIN NATRIURETIC PEPTIDE: B Natriuretic Peptide: 808 pg/mL — ABNORMAL HIGH (ref 0.0–100.0)

## 2017-09-24 MED ORDER — TAMSULOSIN HCL 0.4 MG PO CAPS: 0.4000 mg | ORAL_CAPSULE | Freq: Every day | ORAL | Status: DC

## 2017-09-24 MED ORDER — FUROSEMIDE 10 MG/ML IJ SOLN
40.0000 mg | Freq: Every day | INTRAMUSCULAR | Status: DC
  Administered 2017-09-24: 40 mg via INTRAVENOUS
  Filled 2017-09-24: qty 4

## 2017-09-24 MED ORDER — SODIUM CHLORIDE 0.9 % IV SOLN
250.0000 mL | INTRAVENOUS | Status: DC | PRN
Start: 2017-09-24 — End: 2017-09-25

## 2017-09-24 MED ORDER — METOPROLOL SUCCINATE ER 100 MG PO TB24: 100.0000 mg | ORAL_TABLET | Freq: Every day | ORAL | Status: DC

## 2017-09-24 MED ORDER — ACETAMINOPHEN 325 MG PO TABS
650.0000 mg | ORAL_TABLET | ORAL | Status: DC | PRN
  Filled 2017-09-24: qty 2

## 2017-09-24 MED ORDER — FUROSEMIDE 10 MG/ML IJ SOLN
20.0000 mg | Freq: Once | INTRAMUSCULAR | Status: AC
  Administered 2017-09-24: 20 mg via INTRAVENOUS
  Filled 2017-09-24: qty 4

## 2017-09-24 MED ORDER — SODIUM CHLORIDE 0.9% FLUSH
3.0000 mL | Freq: Two times a day (BID) | INTRAVENOUS | Status: DC
  Administered 2017-09-24 (×2): 3 mL via INTRAVENOUS

## 2017-09-24 MED ORDER — CALCIUM CARBONATE-VITAMIN D 500-200 MG-UNIT PO TABS: 1.0000 | ORAL_TABLET | Freq: Every day | ORAL | Status: DC

## 2017-09-24 MED ORDER — ASPIRIN EC 81 MG PO TBEC: 81.0000 mg | DELAYED_RELEASE_TABLET | Freq: Every day | ORAL | Status: DC

## 2017-09-24 MED ORDER — MORPHINE SULFATE (PF) 2 MG/ML IV SOLN
2.0000 mg | INTRAVENOUS | Status: DC | PRN
  Administered 2017-09-24 (×3): 2 mg via INTRAVENOUS
  Filled 2017-09-24 (×3): qty 1

## 2017-09-24 MED ORDER — CALCIUM CARBONATE 600 MG PO TABS: 600.0000 mg | ORAL_TABLET | Freq: Every day | ORAL | Status: DC

## 2017-09-24 MED ORDER — ENOXAPARIN SODIUM 30 MG/0.3ML ~~LOC~~ SOLN
30.0000 mg | SUBCUTANEOUS | Status: DC
  Administered 2017-09-24: 30 mg via SUBCUTANEOUS
  Filled 2017-09-24: qty 0.3

## 2017-09-24 MED ORDER — POTASSIUM CHLORIDE CRYS ER 10 MEQ PO TBCR: 10.0000 meq | EXTENDED_RELEASE_TABLET | Freq: Every day | ORAL | Status: DC

## 2017-09-24 MED ORDER — SODIUM CHLORIDE 0.9% FLUSH
3.0000 mL | INTRAVENOUS | Status: DC | PRN
Start: 2017-09-24 — End: 2017-09-25

## 2017-09-24 MED ORDER — ALLOPURINOL 300 MG PO TABS
300.0000 mg | ORAL_TABLET | Freq: Every day | ORAL | Status: DC
  Filled 2017-09-24: qty 1

## 2017-09-24 MED ORDER — LEVALBUTEROL TARTRATE 45 MCG/ACT IN AERO: 1.0000 | INHALATION_SPRAY | Freq: Three times a day (TID) | RESPIRATORY_TRACT | Status: DC

## 2017-09-24 MED ORDER — SIMVASTATIN 20 MG PO TABS: 20.0000 mg | ORAL_TABLET | Freq: Every day | ORAL | Status: DC

## 2017-09-24 MED ORDER — FUROSEMIDE 20 MG PO TABS: 20.0000 mg | ORAL_TABLET | Freq: Every day | ORAL | Status: DC

## 2017-09-24 MED ORDER — LEVALBUTEROL HCL 0.63 MG/3ML IN NEBU
0.6300 mg | INHALATION_SOLUTION | Freq: Three times a day (TID) | RESPIRATORY_TRACT | Status: DC
  Administered 2017-09-24: 0.63 mg via RESPIRATORY_TRACT
  Filled 2017-09-24 (×2): qty 3

## 2017-09-24 MED ORDER — POLYETHYLENE GLYCOL 3350 17 G PO PACK: 17.0000 g | PACK | Freq: Every day | ORAL | Status: DC | PRN

## 2017-09-24 MED ORDER — ORAL CARE MOUTH RINSE
15.0000 mL | Freq: Two times a day (BID) | OROMUCOSAL | Status: DC
  Administered 2017-09-24: 15 mL via OROMUCOSAL

## 2017-09-24 MED ORDER — AMLODIPINE BESYLATE 5 MG PO TABS: 5.0000 mg | ORAL_TABLET | Freq: Every day | ORAL | Status: DC

## 2017-09-24 MED ORDER — ENSURE ENLIVE PO LIQD: 237.0000 mL | Freq: Three times a day (TID) | ORAL | Status: DC

## 2017-09-24 MED ORDER — ONDANSETRON HCL 4 MG/2ML IJ SOLN: 4.0000 mg | Freq: Four times a day (QID) | INTRAMUSCULAR | Status: DC | PRN

## 2017-09-24 MED ORDER — MORPHINE SULFATE (PF) 2 MG/ML IV SOLN
INTRAVENOUS | Status: AC
  Administered 2017-09-24: 2 mg via INTRAVENOUS
  Filled 2017-09-24: qty 1

## 2017-09-24 NOTE — ED Notes (Signed)
Admitting MD at bedside.

## 2017-09-24 NOTE — Progress Notes (Signed)
Patient c/o shortness of breath, o2 sats in 80's on 6L, nonrebreather placed on patient and respiratory called to see patient. Patient was given breathing treatment and given 10:00 dose of 40mg  IV lasix. MD notified.

## 2017-09-24 NOTE — H&P (Signed)
[place] [facility] [place] NAME: Charles Tapia    MR#:  [id number]  DATE OF BIRTH:  [birth year]  DATE OF ADMISSION:  09/24/2017  PRIMARY CARE PHYSICIAN: Maryland Pink, MD   REQUESTING/REFERRING PHYSICIAN:   CHIEF COMPLAINT:   Chief Complaint  Patient presents with  . Shortness of Breath    HISTORY OF PRESENT ILLNESS: Charles Tapia  is a 83 y.o. male with a known history of coronary artery disease, cardiomyopathy, gout, hyperlipidemia, hypertension, mitral regurgitation, CVA presented to the emergency room with shortness of breath for the last 2 days. Patient has been having increased shortness of breath and has stopped taking Lasix for the last 1 year. He also has been stopped from taking his Coumadin by his primary care physician. Has increased swelling in both the legs secondary to fluid retention. Has history of orthopnea. He was put on oxygen via nasal cannula at 6 L in the emergency room and stabilized. No complaints of any chest pain. Patient still lives alone but dependent on activities of daily living. Was worked up with chest x-ray which showed a fluid congestion and volume overload.  PAST MEDICAL HISTORY:   Past Medical History:  Diagnosis Date  . Allergy   . CAD (coronary artery disease)   . Cardiomyopathy   . Gout   . Hyperlipidemia   . Hypertension   . Mitral regurgitation   . Stroke [facility])    Right putaminal hemorrhage    PAST SURGICAL HISTORY:  Past Surgical History:  Procedure Laterality Date  . APPENDECTOMY    . BACK SURGERY    . CORONARY ARTERY BYPASS GRAFT    . coronary bypass graft stenosis    . SPINE SURGERY      SOCIAL HISTORY:  Social History   Tobacco Use  . Smoking status: Never Smoker  . Smokeless tobacco: Never Used  Substance Use Topics  . Alcohol use: No    FAMILY HISTORY:  Family History  Problem Relation Age of Onset  . Diabetes Son     DRUG ALLERGIES:  Allergies  Allergen Reactions   . Ace Inhibitors Other (See Comments)    Reaction:  Hyperkalemia and renal insufficiency   . Penicillins Hives and Other (See Comments)    Unable to obtain enough information to answer additional questions about this medication.      REVIEW OF SYSTEMS:   CONSTITUTIONAL: No fever, fatigue or weakness.  EYES: No blurred or double vision.  EARS, NOSE, AND THROAT: No tinnitus or ear pain.  RESPIRATORY: Has cough, shortness of breath,  No wheezing or hemoptysis.  CARDIOVASCULAR: No chest pain, orthopnea, edema.  GASTROINTESTINAL: No nausea, vomiting, diarrhea or abdominal pain.  GENITOURINARY: No dysuria, hematuria.  ENDOCRINE: No polyuria, nocturia,  HEMATOLOGY: No anemia, easy bruising or bleeding SKIN: No rash or lesion. MUSCULOSKELETAL: No joint pain or arthritis.   Swelling in legs NEUROLOGIC: No tingling, numbness, weakness.  PSYCHIATRY: No anxiety or depression.   MEDICATIONS AT HOME:  Prior to Admission medications   Medication Sig Start Date End Date Taking? Authorizing Provider  acetaminophen (TYLENOL) 500 MG tablet Take 1,000 mg by mouth every 6 (six) hours as needed for mild pain.     [provider]  allopurinol (ZYLOPRIM) 300 MG tablet Take 300 mg by mouth daily.    [provider]  amLODipine (NORVASC) 5 MG tablet Take 5 mg by mouth daily.    [provider]  calcium carbonate (OS-CAL) 600 MG  TABS Take 600 mg by mouth daily.    [provider]  feeding supplement, ENSURE ENLIVE, (ENSURE ENLIVE) LIQD Take 237 mLs by mouth 4 (four) times daily -  with meals and at bedtime. 11/13/15   Epifanio Lesches, MD  levalbuterol [facility] HFA) 45 MCG/ACT inhaler Inhale 1 puff into the lungs 3 (three) times daily. 11/13/15   Epifanio Lesches, MD  levofloxacin (LEVAQUIN) 250 MG tablet Take 1 tablet (250 mg total) by mouth daily. 11/13/15   Epifanio Lesches, MD  metoprolol succinate (TOPROL XL) 100 MG 24 hr tablet Take 1 tablet (100 mg total)  by mouth daily. Take with or immediately following a meal. 11/13/15   Epifanio Lesches, MD  polyethylene glycol (MIRALAX / GLYCOLAX) packet Take 17 g by mouth daily as needed for mild constipation.     [provider]  simvastatin (ZOCOR) 20 MG tablet Take 20 mg by mouth at bedtime.     [provider]  tamsulosin (FLOMAX) 0.4 MG CAPS capsule Take 0.4 mg by mouth daily.    [provider]  warfarin (COUMADIN) 4 MG tablet Take 4 mg by mouth daily.    [provider]      PHYSICAL EXAMINATION:   VITAL SIGNS: Blood pressure 111/67, pulse 90, temperature 98 F (36.7 C), temperature source Oral, resp. rate 20, height 5\' 4"  (1.626 m), weight 69.9 kg (154 lb), SpO2 93 %.  GENERAL:  83 y.o.-year-old patient lying in the bed with no acute distress.  EYES: Pupils equal, round, reactive to light and accommodation. No scleral icterus. Extraocular muscles intact.  HEENT: Head atraumatic, normocephalic. Oropharynx and nasopharynx clear.  NECK:  Supple, no jugular venous distention. No thyroid enlargement, no tenderness.  LUNGS: Decreased breath sounds bilaterally, bilateral crepitations heard.  Scattered rhonchi heard in both lungs.  CARDIOVASCULAR: S1, S2 irregular. No murmurs, rubs, or gallops.  ABDOMEN: Soft, nontender, nondistended. Bowel sounds present. No organomegaly or mass.  EXTREMITIES: Has pedal edema,  No cyanosis, or clubbing.  NEUROLOGIC: Cranial nerves II through XII are intact. Muscle strength 5/5 in all extremities. Sensation intact. Gait not checked.  PSYCHIATRIC: The patient is alert and oriented x 3.  SKIN: No obvious rash, lesion, or ulcer.   LABORATORY PANEL:   CBC Recent Labs  Lab 09/24/17 0223  WBC 6.9  HGB 11.4*  HCT 33.9*  PLT 159  MCV 110.4*  MCH 37.2*  MCHC 33.7  RDW 15.0*  LYMPHSABS 1.0  MONOABS 0.6  EOSABS 0.0  BASOSABS 0.0    ------------------------------------------------------------------------------------------------------------------  Chemistries  Recent Labs  Lab 09/24/17 0223  NA 139  K 4.3  CL 103  CO2 24  GLUCOSE 163*  BUN 41*  CREATININE 1.45*  CALCIUM 9.0  AST 26  ALT 14*  ALKPHOS 67  BILITOT 1.6*   ------------------------------------------------------------------------------------------------------------------ estimated creatinine clearance is 24.4 mL/min (A) (by C-G formula based on SCr of 1.45 mg/dL (H)). ------------------------------------------------------------------------------------------------------------------ No results for input(s): TSH, T4TOTAL, T3FREE, THYROIDAB in the last 72 hours.  Invalid input(s): FREET3   Coagulation profile No results for input(s): INR, PROTIME in the last 168 hours. ------------------------------------------------------------------------------------------------------------------- No results for input(s): DDIMER in the last 72 hours. -------------------------------------------------------------------------------------------------------------------  Cardiac Enzymes Recent Labs  Lab 09/24/17 0223  TROPONINI <0.03   ------------------------------------------------------------------------------------------------------------------ Invalid input(s): POCBNP  ---------------------------------------------------------------------------------------------------------------  Urinalysis    Component Value Date/Time   COLORURINE YELLOW (A) 11/09/2015 1501   APPEARANCEUR CLEAR (A) 11/09/2015 1501   APPEARANCEUR Clear 10/09/2012 0053   LABSPEC 1.017 11/09/2015 1501   LABSPEC  1.009 10/09/2012 0053   PHURINE 5.0 11/09/2015 1501   GLUCOSEU NEGATIVE 11/09/2015 1501   GLUCOSEU Negative 10/09/2012 0053   HGBUR 2+ (A) 11/09/2015 1501   BILIRUBINUR NEGATIVE 11/09/2015 1501   BILIRUBINUR Negative 10/09/2012 0053   KETONESUR NEGATIVE 11/09/2015 1501    PROTEINUR 100 (A) 11/09/2015 1501   UROBILINOGEN 1.0 11/27/2011 1815   NITRITE NEGATIVE 11/09/2015 1501   LEUKOCYTESUR NEGATIVE 11/09/2015 1501   LEUKOCYTESUR Negative 10/09/2012 0053     RADIOLOGY: Dg Chest Port 1 View  Result Date: 09/24/2017 CLINICAL DATA:  Shortness of breath EXAM: PORTABLE CHEST 1 VIEW COMPARISON:  Chest radiograph 11/09/2015 FINDINGS: Sequelae of coronary artery bypass graft. Cardiomegaly with unchanged calcific atherosclerosis of the aortic arch. Large area of left basilar consolidation. Mild interstitial pulmonary edema. Small left pleural effusion. Right hemidiaphragm remains elevated. No pneumothorax. IMPRESSION: 1. Cardiomegaly, small left pleural effusion and interstitial pulmonary edema, consistent with congestive heart failure. 2. Consolidation of the left lung base is probably atelectasis. Pneumonia is less likely. Electronically Signed   By: Ulyses Jarred M.D.   On: 09/24/2017 02:45    EKG: Orders placed or performed during the hospital encounter of 09/24/17  . ED EKG  . ED EKG  . EKG 12-Lead  . EKG 12-Lead    IMPRESSION AND PLAN: 83 year old elderly male patient with history of cardiomyopathy, hypertension, hyperlipidemia, gout, mitral regurgitation, CVA presented to the emergency room with increased shortness of breath and swelling in the legs.  Admitting diagnosis 1. Acute congestive heart failure 2. Cardiomyopathy 3. Elevated lactic acid secondary to dehydration 4. Hyperlipidemia 5. Hypertension Treatment plan Admit patient to telemetry inpatient service Diurese patient with IV Lasix 40 MG daily Repeat lactic acid level DVT prophylaxis subcutaneous Lovenox 40 MG daily Echocardiogram And resume cardiac medications Low-salt diet  All the records are reviewed and case discussed with ED provider. Management plans discussed with the patient, family and they are in agreement.  CODE STATUS:FULL CODE Code Status History    Date Active Date  Inactive Code Status Order ID Comments User Context   11/09/2015 16:28 11/13/2015 15:14 Full Code [id number]  Baxter Hire, MD ED       TOTAL TIME TAKING CARE OF THIS PATIENT: 52 minutes.    Saundra Shelling M.D on 09/24/2017 at 3:32 AM  Between 7am to 6pm - Pager - [phone]  After 6pm go to [email or web address] - password [email or web address] [facility]  [facility]  Office  [phone]  CC: Primary care physician; Maryland Pink, MD

## 2017-09-24 NOTE — ED Provider Notes (Signed)
[facility] Emergency Department Provider Note  ____________________________________________   First MD Initiated Contact with Patient 09/24/17 [phone]     (approximate)  I have reviewed the triage vital signs and the nursing notes.   HISTORY  Chief Complaint Shortness of Breath    HPI Charles Tapia is a 83 y.o. male who presents by EMS and who still lives alone but has family nearby (and who are present in the exam room during my evaluation).  He has a history of coronary artery disease status post CABG and cardiomyopathy which resulted in CHF.  His daughter confirms that he previously was on warfarin for A. fib but his primary care doctor recently took him off of the warfarin due to his age.  Similarly his doctor recently discontinued his Lasix.  The patient reports gradually worsening shortness of breath over the few days which got acutely worse tonight when he was lying down.  His family found him sitting up and trying to breathe.  EMS was called and the patient's SPO2 was in the low 80s initially due to his oxygenation came up to low 90s.  He is not using any he has had some swelling in his lower legs.  He denies chest pain, nausea, vomiting, and abdominal pain.  He describes his symptoms as severe.  Exertion makes his symptoms worse.  Of note, his daughter thinks that the patient has a DNR order, but she is not sure where it is.  The patient's son is his power of attorney but the son is currently admitted to the hospital for complications after surgery.  His daughter is going to look into the DNR status.  Past Medical History:  Diagnosis Date  . Allergy   . CAD (coronary artery disease)   . Cardiomyopathy   . Gout   . Hyperlipidemia   . Hypertension   . Mitral regurgitation   . Stroke [facility])    Right putaminal hemorrhage    Patient Active Problem List   Diagnosis Date Noted  . CHF (congestive heart failure) ([place]) 09/24/2017  . Pneumonia 11/09/2015    . Knee osteoarthritis 01/23/2012  . Knee pain, right 01/01/2012  . Spinal stenosis, lumbar region, without neurogenic claudication 01/01/2012  . Thoracic or lumbosacral neuritis or radiculitis, unspecified 01/01/2012  . Intracerebral hemorrhage ([place]) 11/22/2011  . Hypertension 11/22/2011  . Hyperlipidemia 11/22/2011  . Coronary artery disease 11/22/2011  . Gout 11/22/2011  . Cardiomyopathy 11/22/2011  . Mitral regurgitation 11/22/2011    Past Surgical History:  Procedure Laterality Date  . APPENDECTOMY    . BACK SURGERY    . CORONARY ARTERY BYPASS GRAFT    . coronary bypass graft stenosis    . SPINE SURGERY      Prior to Admission medications   Medication Sig Start Date End Date Taking? Authorizing Provider  allopurinol (ZYLOPRIM) 300 MG tablet Take 300 mg by mouth daily.   Yes [provider]  amLODipine (NORVASC) 5 MG tablet Take 5 mg by mouth daily.   Yes [provider]  metoprolol succinate (TOPROL XL) 100 MG 24 hr tablet Take 1 tablet (100 mg total) by mouth daily. Take with or immediately following a meal. 11/13/15  Yes Epifanio Lesches, MD  simvastatin (ZOCOR) 20 MG tablet Take 20 mg by mouth at bedtime.    Yes [provider]  tamsulosin (FLOMAX) 0.4 MG CAPS capsule Take 0.4 mg by mouth daily.   Yes [provider]  acetaminophen (TYLENOL) 500 MG tablet  Take 1,000 mg by mouth every 6 (six) hours as needed for mild pain.     [provider]  calcium carbonate (OS-CAL) 600 MG TABS Take 600 mg by mouth daily.    [provider]  feeding supplement, ENSURE ENLIVE, (ENSURE ENLIVE) LIQD Take 237 mLs by mouth 4 (four) times daily -  with meals and at bedtime. 11/13/15   Epifanio Lesches, MD  levalbuterol [facility] HFA) 45 MCG/ACT inhaler Inhale 1 puff into the lungs 3 (three) times daily. 11/13/15   Epifanio Lesches, MD  polyethylene glycol (MIRALAX / GLYCOLAX) packet Take 17 g by mouth daily as needed for mild  constipation.     [provider]  warfarin (COUMADIN) 4 MG tablet Take 4 mg by mouth daily.    [provider]    Allergies Ace inhibitors and Penicillins  Family History  Problem Relation Age of Onset  . Diabetes Son     Social History Social History   Tobacco Use  . Smoking status: Never Smoker  . Smokeless tobacco: Never Used  Substance Use Topics  . Alcohol use: No  . Drug use: No    Review of Systems Constitutional: No fever/chills Eyes: No visual changes. Cardiovascular: Denies chest pain. Respiratory: Severe shortness of breath. Gastrointestinal: No abdominal pain.  No nausea, no vomiting.  No diarrhea.  No constipation. Genitourinary: Negative for dysuria. Musculoskeletal: Negative for neck pain.  Negative for back pain. Integumentary: Negative for rash. Neurological: Negative for headaches, focal weakness or numbness.   ____________________________________________   PHYSICAL EXAM:  VITAL SIGNS: ED Triage Vitals  Enc Vitals Group     BP 09/24/17 0230 118/67     Pulse Rate 09/24/17 0230 99     Resp 09/24/17 0230 (!) 25     Temp 09/24/17 0234 98 F (36.7 C)     Temp Source 09/24/17 0234 Oral     SpO2 09/24/17 0230 91 %     Weight 09/24/17 0235 69.9 kg (154 lb)     Height 09/24/17 0235 1.626 m (5\' 4" )     Head Circumference --      Peak Flow --      Pain Score 09/24/17 0232 2     Pain Loc --      Pain Edu? --      Excl. in [place]? --     Constitutional: Alert and oriented.  Elderly but in no acute distress at this time Eyes: Conjunctivae are normal.  Head: Atraumatic. Cardiovascular: Borderline tachycardia, regular rhythm. Good peripheral circulation. Grossly normal heart sounds. Respiratory: Very slightly increased respiratory rate but no accessory muscle usage.  No retractions.  Coarse breath sounds primarily in the bases, no wheezing Gastrointestinal: Soft and nontender. No distention.  Musculoskeletal: Pitting edema in  bilateral feet and lower legs. No gross deformities of extremities. Neurologic:  Normal speech and language. No gross focal neurologic deficits are appreciated.  Skin:  Skin is warm, dry and intact. No rash noted.   ____________________________________________   LABS (all labs ordered are listed, but only abnormal results are displayed)  Labs Reviewed  COMPREHENSIVE METABOLIC PANEL - Abnormal; Notable for the following components:      Result Value   Glucose, Bld 163 (*)    BUN 41 (*)    Creatinine, Ser 1.45 (*)    ALT 14 (*)    Total Bilirubin 1.6 (*)    GFR calc non Af Amer 39 (*)    GFR calc Af Amer 45 (*)  All other components within normal limits  BRAIN NATRIURETIC PEPTIDE - Abnormal; Notable for the following components:   B Natriuretic Peptide 808.0 (*)    All other components within normal limits  LACTIC ACID, PLASMA - Abnormal; Notable for the following components:   Lactic Acid, Venous 2.3 (*)    All other components within normal limits  CBC WITH DIFFERENTIAL/PLATELET - Abnormal; Notable for the following components:   RBC 3.07 (*)    Hemoglobin 11.4 (*)    HCT 33.9 (*)    MCV 110.4 (*)    MCH 37.2 (*)    RDW 15.0 (*)    All other components within normal limits  LIPASE, BLOOD  TROPONIN I  LACTIC ACID, PLASMA  CBC  CREATININE, SERUM  TROPONIN I  TROPONIN I  TROPONIN I   ____________________________________________  EKG  ED ECG REPORT I, Hinda Kehr, the attending physician, personally viewed and interpreted this ECG.  Date: 09/24/2017 EKG Time: 2:30 AM Rate: 99 Rhythm: Relation QRS Axis: normal Intervals: Right bundle branch block and left anterior fascicular block ST/T Wave abnormalities: Non-specific ST segment / T-wave changes, but no evidence of acute ischemia. Narrative Interpretation: no evidence of acute ischemia   ____________________________________________  RADIOLOGY I, Hinda Kehr, personally viewed and evaluated these images  (plain radiographs) as part of my medical decision making, as well as reviewing the written report by the radiologist.  Dg Chest Port 1 View  Result Date: 09/24/2017 CLINICAL DATA:  Shortness of breath EXAM: PORTABLE CHEST 1 VIEW COMPARISON:  Chest radiograph 11/09/2015 FINDINGS: Sequelae of coronary artery bypass graft. Cardiomegaly with unchanged calcific atherosclerosis of the aortic arch. Large area of left basilar consolidation. Mild interstitial pulmonary edema. Small left pleural effusion. Right hemidiaphragm remains elevated. No pneumothorax. IMPRESSION: 1. Cardiomegaly, small left pleural effusion and interstitial pulmonary edema, consistent with congestive heart failure. 2. Consolidation of the left lung base is probably atelectasis. Pneumonia is less likely. Electronically Signed   By: Ulyses Jarred M.D.   On: 09/24/2017 02:45    ____________________________________________   PROCEDURES  Critical Care performed: No   Procedure(s) performed:   Procedures   ____________________________________________   INITIAL IMPRESSION / ASSESSMENT AND PLAN / ED COURSE  As part of my medical decision making, I reviewed the following data within the [place] History obtained from family, Nursing notes reviewed and incorporated, Labs reviewed , EKG interpreted , Radiograph reviewed  and Discussed with admitting physician (Dr. Marcille Blanco and Dr. Estanislado Pandy).    The patient requires 6 L of oxygen by nasal cannula to maintain his SPO2, but he is not retracting. Differential includes, but is not limited to, viral syndrome, bronchitis including COPD exacerbation, pneumonia, reactive airway disease including asthma, CHF including exacerbation with or without pulmonary/interstitial edema, pneumothorax, ACS, thoracic trauma, and pulmonary embolism.  However his chest x-ray and physical exam are most consistent with CHF exacerbation, and he was recently taken off of his Lasix.  I will  treat with Lasix 20 mg IV and speak with the admitting doctors.  He has no evidence of infection at this time.  Lab results are otherwise generally unremarkable, lactic acid is pending.  The patient is in no acute distress currently.  Family agrees with the plan.  I do not believe he needs BiPAP at this time although he may if he is not adequately diuresing.  Clinical Course as of Sep 24 413  Thu Sep 24, 2017  0307 Discussed in person with Dr. Marcille Blanco (hospitalist), who  will discuss in person with Dr. Estanislado Pandy for admission.  [CF]  0406 The patient is noted to have a lactate>2. With the current information available to me, I do not think the patient is septic, and the elevated lactate is more likely related to his fluid / PO status. Lactic Acid, Venous: (!!) 2.3 [CF]    Clinical Course User Index [CF] Hinda Kehr, MD    ____________________________________________  FINAL CLINICAL IMPRESSION(S) / ED DIAGNOSES  Final diagnoses:  Acute respiratory failure with hypoxemia ([place])  Acute on chronic congestive heart failure, unspecified heart failure type ([place])  Acute pulmonary edema ([facility])     MEDICATIONS GIVEN DURING THIS VISIT:  Medications  allopurinol (ZYLOPRIM) tablet 300 mg (not administered)  amLODipine (NORVASC) tablet 5 mg (not administered)  calcium carbonate (OS-CAL) tablet 600 mg (not administered)  feeding supplement (ENSURE ENLIVE) (ENSURE ENLIVE) liquid 237 mL (not administered)  levalbuterol (XOPENEX HFA) inhaler 1 puff (not administered)  metoprolol succinate (TOPROL-XL) 24 hr tablet 100 mg (not administered)  polyethylene glycol (MIRALAX / GLYCOLAX) packet 17 g (not administered)  simvastatin (ZOCOR) tablet 20 mg (not administered)  tamsulosin (FLOMAX) capsule 0.4 mg (not administered)  sodium chloride flush (NS) 0.9 % injection 3 mL (not administered)  sodium chloride flush (NS) 0.9 % injection 3 mL (not administered)  0.9 %  sodium chloride infusion (not administered)   acetaminophen (TYLENOL) tablet 650 mg (not administered)  ondansetron (ZOFRAN) injection 4 mg (not administered)  enoxaparin (LOVENOX) injection 40 mg (not administered)  furosemide (LASIX) injection 40 mg (not administered)  potassium chloride (K-DUR,KLOR-CON) CR tablet 10 mEq (not administered)  aspirin EC tablet 81 mg (not administered)  furosemide (LASIX) injection 20 mg (20 mg Intravenous Given 09/24/17 9518)     ED Discharge Orders    None       Note:  This document was prepared using Dragon voice recognition software and may include unintentional dictation errors.    Hinda Kehr, MD 09/24/17 [phone]

## 2017-09-24 NOTE — Progress Notes (Signed)
[facility] NAME: Charles Tapia    MR#:  [id number]  DATE OF BIRTH:  1918/07/19  SUBJECTIVE:  CHIEF COMPLAINT: Patient is very short of breath and working hard to breathe.  Daughter at bedside requesting comfort care  REVIEW OF SYSTEMS:  Review of system unobtainable patient is extremely short of breath   DRUG ALLERGIES:   Allergies  Allergen Reactions  . Ace Inhibitors Other (See Comments)    Reaction:  Hyperkalemia and renal insufficiency   . Penicillins Hives and Other (See Comments)    Unable to obtain enough information to answer additional questions about this medication.      VITALS:  Blood pressure 133/81, pulse 100, temperature 98.1 F (36.7 C), temperature source Oral, resp. rate (!) 25, height 5\' 5"  (1.651 m), weight 62.3 kg (137 lb 4.8 oz), SpO2 98 %.  PHYSICAL EXAMINATION:  GENERAL:  83 y.o.-year-old patient lying in the bed with no acute distress.  EYES: Pupils equal, round, reactive to light and accommodation. No scleral icterus. Extraocular muscles intact.  HEENT: Head atraumatic, normocephalic. Oropharynx and nasopharynx clear.  NECK:  Supple, no jugular venous distention. No thyroid enlargement  LUNGS: Diminished breath sounds with accessory muscle use  CARDIOVASCULAR: S1, S2 normal. No murmurs, rubs, or gallops.  ABDOMEN: Soft, nontender, nondistended. Bowel sounds present. No organomegaly or mass.  EXTREMITIES: No pedal edema, cyanosis, or clubbing.  NEUROLOGIC: Delirious     LABORATORY PANEL:   CBC Recent Labs  Lab 09/24/17 0435  WBC 6.9  HGB 11.2*  HCT 34.2*  PLT 161   ------------------------------------------------------------------------------------------------------------------  Chemistries  Recent Labs  Lab 09/24/17 0223 09/24/17 0435  NA 139  --   K 4.3  --   CL 103  --   CO2 24  --   GLUCOSE 163*  --   BUN 41*  --   CREATININE 1.45* 1.58*  CALCIUM 9.0  --   AST 26   --   ALT 14*  --   ALKPHOS 67  --   BILITOT 1.6*  --    ------------------------------------------------------------------------------------------------------------------  Cardiac Enzymes Recent Labs  Lab 09/24/17 0435  TROPONINI <0.03   ------------------------------------------------------------------------------------------------------------------  RADIOLOGY:  Dg Chest Port 1 View  Result Date: 09/24/2017 CLINICAL DATA:  Shortness of breath EXAM: PORTABLE CHEST 1 VIEW COMPARISON:  Chest radiograph 11/09/2015 FINDINGS: Sequelae of coronary artery bypass graft. Cardiomegaly with unchanged calcific atherosclerosis of the aortic arch. Large area of left basilar consolidation. Mild interstitial pulmonary edema. Small left pleural effusion. Right hemidiaphragm remains elevated. No pneumothorax. IMPRESSION: 1. Cardiomegaly, small left pleural effusion and interstitial pulmonary edema, consistent with congestive heart failure. 2. Consolidation of the left lung base is probably atelectasis. Pneumonia is less likely. Electronically Signed   By: Ulyses Jarred M.D.   On: 09/24/2017 02:45    EKG:   Orders placed or performed during the hospital encounter of 09/24/17  . ED EKG  . ED EKG  . EKG 12-Lead  . EKG 12-Lead    ASSESSMENT AND PLAN:    83 year old elderly male patient with history of cardiomyopathy, hypertension, hyperlipidemia, gout, mitral regurgitation, CVA presented to the emergency room with increased shortness of breath and swelling in the legs.  1.   Acute hypoxic respiratory failure secondary to acute congestive heart failure 2. Cardiomyopathy 3. Elevated lactic acid secondary to dehydration 4. Hyperlipidemia 5. Hypertension  Patient's CODE STATUS changed to DO NOT RESUSCITATE with comfort care measures as per his  wishes Will implement strict comfort care measures with morphine, Tylenol as needed oxygen via nasal cannula and Lasix for comfort only Palliative care  consult placed    All the records are reviewed and case discussed with Care Management/Social Workerr. Management plans discussed with the patients son Mr. Richardson Landry who is the healthcare power of attorney and and daughter at bedside and they are in agreement  CODE STATUS: DO NOT RESUSCITATE, comfort care  TOTAL TIME TAKING CARE OF THIS PATIENT: 25minutes.   POSSIBLE D/C IN 1-2 DAYS, DEPENDING ON CLINICAL CONDITION.  Note: This dictation was prepared with Dragon dictation along with smaller phrase technology. Any transcriptional errors that result from this process are unintentional.   Nicholes Mango M.D on 09/24/2017 at 11:07 AM  Between 7am to 6pm - Pager - [phone] After 6pm go to [email or web address] - password [email or web address] [facility]  [facility]  Office  [phone]  CC: Primary care physician; Maryland Pink, MD

## 2017-09-24 NOTE — ED Notes (Signed)
  Test: lactic acid  Critical Value: 2.3  Name of Provider Notified: Karma Greaser

## 2017-09-24 NOTE — ED Triage Notes (Signed)
Pt bib [facility] from home d/t increased SHOB at night xseveral nights. Per EMS: pt woke from sleep tonight SHOB and grunting with initial room air sats 80%. Pt has hx CHF with bilateral +4 pitting edema. VSS in route on 6L [place] 93%

## 2017-09-24 NOTE — Plan of Care (Signed)
Patient's oxygen sats remain in 70's-80's on 2L. Patient not eating, becomes very short of breath with movement, morphine given as needed.

## 2017-09-24 NOTE — Progress Notes (Signed)
*  PRELIMINARY RESULTS* Echocardiogram 2D Echocardiogram has been performed.  Sherrie Sport 09/24/2017, 11:00 AM

## 2017-09-24 NOTE — ED Notes (Signed)
Allison RN, aware of bed assigned  

## 2017-09-25 DIAGNOSIS — J9601 Acute respiratory failure with hypoxia: Secondary | ICD-10-CM

## 2017-09-25 DIAGNOSIS — Z7189 Other specified counseling: Secondary | ICD-10-CM

## 2017-09-25 DIAGNOSIS — I509 Heart failure, unspecified: Secondary | ICD-10-CM

## 2017-09-25 DIAGNOSIS — J810 Acute pulmonary edema: Secondary | ICD-10-CM

## 2017-09-25 LAB — ECHOCARDIOGRAM COMPLETE
HEIGHTINCHES: 65 in
Weight: 2196.8 oz

## 2017-09-25 MED ORDER — MORPHINE SULFATE (CONCENTRATE) 10 MG /0.5 ML PO SOLN: 5.0000 mg | ORAL | 0 refills | Status: AC | PRN

## 2017-09-25 MED ORDER — FUROSEMIDE 20 MG PO TABS: 20.0000 mg | ORAL_TABLET | Freq: Two times a day (BID) | ORAL | 0 refills | Status: AC

## 2017-09-25 MED ORDER — ENSURE ENLIVE PO LIQD: 237.0000 mL | Freq: Three times a day (TID) | ORAL | 1 refills | Status: AC

## 2017-09-25 MED ORDER — FUROSEMIDE 20 MG PO TABS
20.0000 mg | ORAL_TABLET | Freq: Two times a day (BID) | ORAL | Status: DC
  Filled 2017-09-25: qty 1

## 2017-09-25 NOTE — Consult Note (Signed)
Consultation Note Date: 09/25/2017   Patient Name: Charles Tapia  DOB: 05-09-1918  MRN: [id number]  Age / Sex: 83 y.o., male  PCP: Maryland Pink, MD Referring Physician: Nicholes Mango, MD  Reason for Consultation: Comfort care measures in place. Home hospice set up, requested to see patient by MD for hospice.    HPI/Patient Profile: Charles Tapia  is a 83 y.o. male with a known history of coronary artery disease, cardiomyopathy, gout, hyperlipidemia, hypertension, mitral regurgitation, CVA presented to the emergency room with shortness of breath for the last 2 days.     Clinical Assessment and Goals of Care: Charles Tapia is resting in bed. He is alert and hard of hearing, he appears tired. His son, POA, states he's been declining since last Christmas. Family alternates with paid caregiver to be with him. He is ambulatory at home. He has a fairly good appetite at home. He states he is not on home O2, but is now on 7.5 lpm by [place]. He is not eating well here. Most important to the family, they state he wants to be home to die. He states it is something his mother and father both felt strongly about. The son who is POA via phone and sister who is present agrees they are willing to take a chance of death on the way home in ambulance to have him at home .    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     Charles Tapia with hospice.   Code Status/Advance Care Planning:  DNR    Symptom Management:   Per primary team  Palliative Prophylaxis:   Bowel Regimen  Additional Recommendations (Limitations, Scope, Preferences):  Full Comfort Care  Prognosis:   < 2 weeks O2 at 7.5 lpm, patient looks tired.   Discharge Planning: Home with Hospice      Primary Diagnoses: Present on Admission: **None**   I have reviewed the medical record, interviewed the patient and family, and examined the  patient. The following aspects are pertinent.  Past Medical History:  Diagnosis Date  . Allergy   . CAD (coronary artery disease)   . Cardiomyopathy   . Gout   . Hyperlipidemia   . Hypertension   . Mitral regurgitation   . Stroke [facility])    Right putaminal hemorrhage   Social History   Socioeconomic History  . Marital status: Married    Spouse name: None  . Number of children: None  . Years of education: None  . Highest education level: None  Social Needs  . Financial resource strain: None  . Food insecurity - worry: None  . Food insecurity - inability: None  . Transportation needs - medical: None  . Transportation needs - non-medical: None  Occupational History  . None  Tobacco Use  . Smoking status: Never Smoker  . Smokeless tobacco: Never Used  Substance and Sexual Activity  . Alcohol use: No  . Drug use: No  . Sexual activity: No  Other Topics Concern  .  None  Social History Narrative  . None   Family History  Problem Relation Age of Onset  . Diabetes Son    Scheduled Meds: . feeding supplement (ENSURE ENLIVE)  237 mL Oral TID WC & HS  . furosemide  20 mg Oral BID  . mouth rinse  15 mL Mouth Rinse BID  . sodium chloride flush  3 mL Intravenous Q12H  . tamsulosin  0.4 mg Oral Daily   Continuous Infusions: . sodium chloride     PRN Meds:.sodium chloride, acetaminophen, morphine injection, ondansetron (ZOFRAN) IV, polyethylene glycol, sodium chloride flush Medications Prior to Admission:  Prior to Admission medications   Medication Sig Start Date End Date Taking? Authorizing Provider  acetaminophen (TYLENOL) 500 MG tablet Take 1,000 mg by mouth every 6 (six) hours as needed for mild pain.    Yes [provider]  allopurinol (ZYLOPRIM) 300 MG tablet Take 300 mg by mouth daily.   Yes [provider]  amLODipine (NORVASC) 5 MG tablet Take 5 mg by mouth daily.   Yes [provider]  metoprolol succinate (TOPROL XL) 100 MG 24 hr  tablet Take 1 tablet (100 mg total) by mouth daily. Take with or immediately following a meal. 11/13/15  Yes Epifanio Lesches, MD  polyethylene glycol (MIRALAX / GLYCOLAX) packet Take 17 g by mouth daily as needed for mild constipation.    Yes [provider]  simvastatin (ZOCOR) 20 MG tablet Take 20 mg by mouth at bedtime.    Yes [provider]  tamsulosin (FLOMAX) 0.4 MG CAPS capsule Take 0.4 mg by mouth daily.   Yes [provider]   Allergies  Allergen Reactions  . Ace Inhibitors Other (See Comments)    Reaction:  Hyperkalemia and renal insufficiency   . Penicillins Hives and Other (See Comments)    Unable to obtain enough information to answer additional questions about this medication.     Review of Systems  Constitutional: Positive for fatigue.    Physical Exam  Constitutional:  Frail.   Eyes: EOM are normal.  Skin: Skin is warm and dry.    Vital Signs: BP 92/62 (BP Location: Right Arm)   Pulse (!) 56   Temp 97.9 F (36.6 C) (Oral)   Resp 17   Ht 5\' 5"  (1.651 m)   Wt 62.3 kg (137 lb 4.8 oz)   SpO2 (!) 89%   BMI 22.85 kg/m  Pain Assessment: No/denies pain POSS *See Group Information*: S-Acceptable,Sleep, easy to arouse Pain Score: Asleep   SpO2: SpO2: (!) 89 % O2 Device:SpO2: (!) 89 % O2 Flow Rate: .O2 Flow Rate (L/min): 6 L/min  IO: Intake/output summary:   Intake/Output Summary (Last 24 hours) at 09/25/2017 1122 Last data filed at 09/25/2017 1020 Gross per 24 hour  Intake 240 ml  Output 200 ml  Net 40 ml    LBM: Last BM Date: 09/23/17 Baseline Weight: Weight: 69.9 kg (154 lb) Most recent weight: Weight: 62.3 kg (137 lb 4.8 oz)     Palliative Assessment/Data: 20%     Time In: 11:25 Time Out: 12:15 Time Total: 50 min Greater than 50%  of this time was spent counseling and coordinating care related to the above assessment and plan.  Signed by: Asencion Gowda, NP 09/25/2017 12:29 PM Office: [phone] 7am-7pm    Please see Amion for pager number and availability  Call primary team after hours  Please contact Palliative Medicine Team phone at [phone] for questions and concerns.  For individual  provider: See Shea Evans

## 2017-09-25 NOTE — Care Management Important Message (Signed)
Important Message  Patient Details  Name: Charles Tapia MRN: [id number] Date of Birth: 11/16/1917   Medicare Important Message Given:  N/A - LOS <3 / Initial given by admissions    Katrina Stack, RN 09/25/2017, 4:32 PM

## 2017-09-25 NOTE — Care Management (Signed)
Patient and his family wish for patient to discharge home today with [facility].  Order present for hospital bed and oxygen.  Will transport  home by ems.

## 2017-09-25 NOTE — Discharge Summary (Signed)
[facility] NAME: Charles Tapia    MR#:  [id number]  DATE OF BIRTH:  09/16/1918  DATE OF ADMISSION:  09/24/2017 ADMITTING PHYSICIAN: Saundra Shelling, MD  DATE OF DISCHARGE:  09/25/17  PRIMARY CARE PHYSICIAN: Maryland Pink, MD    ADMISSION DIAGNOSIS:  Acute pulmonary edema (HCC) [J81.0] Acute respiratory failure with hypoxemia (HCC) [J96.01] Acute on chronic congestive heart failure, unspecified heart failure type ([place]) [I50.9]  DISCHARGE DIAGNOSIS:   Acute hypoxemic respiratory failure Acute on chronic diastolic CHF Comfort care at home SECONDARY DIAGNOSIS:   Past Medical History:  Diagnosis Date  . Allergy   . CAD (coronary artery disease)   . Cardiomyopathy   . Gout   . Hyperlipidemia   . Hypertension   . Mitral regurgitation   . Stroke [facility])    Right putaminal hemorrhage    HOSPITAL COURSE:   83 year old elderly male patient with history of cardiomyopathy, hypertension, hyperlipidemia, gout, mitral regurgitation, CVA presented to the emergency room with increased shortness of breath and swelling in the legs.  1.  Acute hypoxic respiratory failure secondary to acute congestive heart failure 2.Cardiomyopathy 3.Elevated lactic acid secondary to dehydration 4.Hyperlipidemia 5.Hypertension  Patient's CODE STATUS changed to DO NOT RESUSCITATE with comfort care measures as per his wishes Will implement strict comfort care measures with morphine, Tylenol as needed oxygen via nasal cannula and Lasix for comfort only  Discharge patient with hospice care at home  DISCHARGE CONDITIONS:   guarded  CONSULTS OBTAINED:     PROCEDURES  none  DRUG ALLERGIES:   Allergies  Allergen Reactions  . Ace Inhibitors Other (See Comments)    Reaction:  Hyperkalemia and renal insufficiency   . Penicillins Hives and Other (See Comments)    Unable to obtain enough information to answer additional questions about  this medication.      DISCHARGE MEDICATIONS:   Current Discharge Medication List    START taking these medications   Details  furosemide (LASIX) 20 MG tablet Take 1 tablet (20 mg total) by mouth 2 (two) times daily. Qty: 60 tablet, Refills: 0    Morphine Sulfate (MORPHINE CONCENTRATE) 10 mg / 0.5 ml concentrated solution Take 0.25 mLs (5 mg total) by mouth every 2 (two) hours as needed for moderate pain, severe pain, anxiety or shortness of breath. Qty: 50 mL, Refills: 0      CONTINUE these medications which have CHANGED   Details  feeding supplement, ENSURE ENLIVE, (ENSURE ENLIVE) LIQD Take 237 mLs by mouth 4 (four) times daily -  with meals and at bedtime. Qty: 100 Bottle, Refills: 1      CONTINUE these medications which have NOT CHANGED   Details  acetaminophen (TYLENOL) 500 MG tablet Take 1,000 mg by mouth every 6 (six) hours as needed for mild pain.     polyethylene glycol (MIRALAX / GLYCOLAX) packet Take 17 g by mouth daily as needed for mild constipation.     tamsulosin (FLOMAX) 0.4 MG CAPS capsule Take 0.4 mg by mouth daily.      STOP taking these medications     allopurinol (ZYLOPRIM) 300 MG tablet      amLODipine (NORVASC) 5 MG tablet      metoprolol succinate (TOPROL XL) 100 MG 24 hr tablet      simvastatin (ZOCOR) 20 MG tablet          DISCHARGE INSTRUCTIONS:   Hospice care at home  DIET:  Cardiac diet  DISCHARGE  CONDITION:  guarded  ACTIVITY:  Activity as tolerated  OXYGEN:  Home Oxygen: Yes.     Oxygen Delivery: 5-6  liters/min via Patient connected to nasal cannula oxygen for comfort  DISCHARGE LOCATION:  home   If you experience worsening of your admission symptoms, develop shortness of breath, life threatening emergency, suicidal or homicidal thoughts you must seek medical attention immediately by calling 911 or calling your MD immediately  if symptoms less severe.  You Must read complete instructions/literature along with all the  possible adverse reactions/side effects for all the Medicines you take and that have been prescribed to you. Take any new Medicines after you have completely understood and accpet all the possible adverse reactions/side effects.   Please note  You were cared for by a hospitalist during your hospital stay. If you have any questions about your discharge medications or the care you received while you were in the hospital after you are discharged, you can call the unit and asked to speak with the hospitalist on call if the hospitalist that took care of you is not available. Once you are discharged, your primary care physician will handle any further medical issues. Please note that NO REFILLS for any discharge medications will be authorized once you are discharged, as it is imperative that you return to your primary care physician (or establish a relationship with a primary care physician if you do not have one) for your aftercare needs so that they can reassess your need for medications and monitor your lab values.     Today  Chief Complaint  Patient presents with  . Shortness of Breath   Patient is feeling better today, wants to go home with hospice .  Family is agreeable.  ROS:  RESPIRATORY: Shortness of breath  better CARDIOVASCULAR: Denies chest pain, palpitations, edema.  GASTROINTESTINAL: Denies nausea, vomiting, diarrhea, abdominal pain. Denies bright red blood per rectum.Marland Kitchen SKIN: Denies rash or lesion. MUSCULOSKELETAL: Denies pain in neck, back, shoulder, knees, hips NEUROLOGIC: Denies paralysis, paresthesias.     VITAL SIGNS:  Blood pressure 92/62, pulse (!) 56, temperature 97.9 F (36.6 C), temperature source Oral, resp. rate 17, height 5\' 5"  (1.651 m), weight 62.3 kg (137 lb 4.8 oz), SpO2 (!) 89 %.  I/O:    Intake/Output Summary (Last 24 hours) at 09/25/2017 1128 Last data filed at 09/25/2017 1020 Gross per 24 hour  Intake 240 ml  Output 200 ml  Net 40 ml    PHYSICAL  EXAMINATION:   GENERAL:  83 y.o.-year-old patient lying in the bed with  acute distress.  EYES: Pupils equal, round, reactive to light and accommodation. No scleral icterus. Extraocular muscles intact.  HEENT: Head atraumatic, normocephalic. Oropharynx and nasopharynx clear.  NECK:  Supple, no jugular venous distention. No thyroid enlargement  LUNGS: Diminished breath sounds with accessory muscle use  CARDIOVASCULAR: S1, S2 normal. No murmurs, rubs, or gallops.  ABDOMEN: Soft, nontender, nondistended. Bowel sounds present. No organomegaly or mass.  EXTREMITIES: No pedal edema, cyanosis, or clubbing.  NEUROLOGIC: Delirious     DATA REVIEW:   CBC Recent Labs  Lab 09/24/17 0435  WBC 6.9  HGB 11.2*  HCT 34.2*  PLT 161    Chemistries  Recent Labs  Lab 09/24/17 0223 09/24/17 0435  NA 139  --   K 4.3  --   CL 103  --   CO2 24  --   GLUCOSE 163*  --   BUN 41*  --   CREATININE 1.45* 1.58*  CALCIUM 9.0  --   AST 26  --   ALT 14*  --   ALKPHOS 67  --   BILITOT 1.6*  --     Cardiac Enzymes Recent Labs  Lab 09/24/17 0435  TROPONINI <0.03    Microbiology Results  Results for orders placed or performed during the hospital encounter of 11/09/15  Culture, blood (routine x 2)     Status: None   Collection Time: 11/09/15  3:01 PM  Result Value Ref Range Status   Specimen Description BLOOD LEFT ARM  Final   Special Requests BOTTLES DRAWN AEROBIC AND ANAEROBIC 4CCAERO,3CCANA  Final   Culture  Setup Time   Final    GRAM POSITIVE COCCI IN BOTH AEROBIC AND ANAEROBIC BOTTLES CRITICAL VALUE NOTED.  VALUE IS CONSISTENT WITH PREVIOUSLY REPORTED AND CALLED VALUE.    Culture   Final    Two different species of coagulase negative Staphylococcus isolated. IN BOTH AEROBIC AND ANAEROBIC BOTTLES Results consistent with contamination.    Report Status 11/14/2015 FINAL  Final  Culture, blood (routine x 2)     Status: None   Collection Time: 11/09/15  3:30 PM  Result Value Ref  Range Status   Specimen Description BLOOD LEFT ASSIST CONTROL  Final   Special Requests BOTTLES DRAWN AEROBIC AND ANAEROBIC 3CCAERO,2CCANA  Final   Culture  Setup Time   Final    GRAM POSITIVE COCCI IN BOTH AEROBIC AND ANAEROBIC BOTTLES CRITICAL RESULT CALLED TO, READ BACK BY AND VERIFIED WITH: SHEEMA HALLAJI AT 1601 11/10/15 DV    Culture   Final    STAPHYLOCOCCUS HOMINIS STAPHYLOCOCCUS EPIDERMIDIS IN BOTH AEROBIC AND ANAEROBIC BOTTLES Results consistent with contamination.    Report Status 11/14/2015 FINAL  Final   Organism ID, Bacteria STAPHYLOCOCCUS HOMINIS  Final      Susceptibility   Staphylococcus hominis - MIC*    CIPROFLOXACIN Value in next row Sensitive      SENSITIVE<=0.5    ERYTHROMYCIN Value in next row Resistant      RESISTANT>=8    CLINDAMYCIN Value in next row Sensitive      SENSITIVE<=0.25    GENTAMICIN Value in next row Sensitive      SENSITIVE<=0.5    NITROFURANTOIN Value in next row Sensitive      SENSITIVE32    OXACILLIN Value in next row Sensitive      SENSITIVE<=0.25 WARNING: For oxacillin-resistant S.aureus and coagulase-negative staphylococci (MRS), other beta-lactam agents, ie, penicillins, beta-lactam/beta-lactamase inhibitor combinations, cephems (with the exception of the cephalosporins with anti-MRSA activity), and carbapenems, may appear active in vitro, but are not effective clinically.  --CLSI, Vol.32 No.3, January 2012, pg 70.    TETRACYCLINE Value in next row Sensitive      SENSITIVE<=1    VANCOMYCIN Value in next row Sensitive      SENSITIVE<=0.5    TRIMETH/SULFA Value in next row Sensitive      SENSITIVE<=10    RIFAMPIN Value in next row Sensitive      SENSITIVE<=0.5    LINEZOLID Value in next row Sensitive      SENSITIVE4    * STAPHYLOCOCCUS HOMINIS  Blood Culture ID Panel (Reflexed)     Status: Abnormal   Collection Time: 11/09/15  3:30 PM  Result Value Ref Range Status   Enterococcus species NOT DETECTED NOT DETECTED Final   Listeria  monocytogenes NOT DETECTED NOT DETECTED Final   Staphylococcus species DETECTED (A) NOT DETECTED Final    Comment: CRITICAL RESULT CALLED TO, READ BACK  BY AND VERIFIED WITH: SHEEMA HALLAJI AT 8101 11/10/15 DV REPORTED STAPH SPECIES AND MECA    Staphylococcus aureus NOT DETECTED NOT DETECTED Final   Streptococcus species NOT DETECTED NOT DETECTED Final   Streptococcus agalactiae NOT DETECTED NOT DETECTED Final   Streptococcus pneumoniae NOT DETECTED NOT DETECTED Final   Streptococcus pyogenes NOT DETECTED NOT DETECTED Final   Acinetobacter baumannii NOT DETECTED NOT DETECTED Final   Enterobacteriaceae species NOT DETECTED NOT DETECTED Final   Enterobacter cloacae complex NOT DETECTED NOT DETECTED Final   Escherichia coli NOT DETECTED NOT DETECTED Final   Klebsiella oxytoca NOT DETECTED NOT DETECTED Final   Klebsiella pneumoniae NOT DETECTED NOT DETECTED Final   Proteus species NOT DETECTED NOT DETECTED Final   Serratia marcescens NOT DETECTED NOT DETECTED Final   Haemophilus influenzae NOT DETECTED NOT DETECTED Final   Neisseria meningitidis NOT DETECTED NOT DETECTED Final   Pseudomonas aeruginosa NOT DETECTED NOT DETECTED Final   Candida albicans NOT DETECTED NOT DETECTED Final   Candida glabrata NOT DETECTED NOT DETECTED Final   Candida krusei NOT DETECTED NOT DETECTED Final   Candida parapsilosis NOT DETECTED NOT DETECTED Final   Candida tropicalis NOT DETECTED NOT DETECTED Final   Carbapenem resistance NOT DETECTED NOT DETECTED Final   Methicillin resistance DETECTED (A) NOT DETECTED Final   Vancomycin resistance NOT DETECTED NOT DETECTED Final    RADIOLOGY:  Dg Chest Port 1 View  Result Date: 09/24/2017 CLINICAL DATA:  Shortness of breath EXAM: PORTABLE CHEST 1 VIEW COMPARISON:  Chest radiograph 11/09/2015 FINDINGS: Sequelae of coronary artery bypass graft. Cardiomegaly with unchanged calcific atherosclerosis of the aortic arch. Large area of left basilar consolidation. Mild  interstitial pulmonary edema. Small left pleural effusion. Right hemidiaphragm remains elevated. No pneumothorax. IMPRESSION: 1. Cardiomegaly, small left pleural effusion and interstitial pulmonary edema, consistent with congestive heart failure. 2. Consolidation of the left lung base is probably atelectasis. Pneumonia is less likely. Electronically Signed   By: Ulyses Jarred M.D.   On: 09/24/2017 02:45    EKG:   Orders placed or performed during the hospital encounter of 09/24/17  . ED EKG  . ED EKG  . EKG 12-Lead  . EKG 12-Lead      Management plans discussed with the patient, family and they are in agreement.  CODE STATUS:     Code Status Orders  (From admission, onward)        Start     Ordered   09/24/17 0847  Do not attempt resuscitation (DNR)  Continuous    Question Answer Comment  In the event of cardiac or respiratory ARREST Do not call a "code blue"   In the event of cardiac or respiratory ARREST Do not perform Intubation, CPR, defibrillation or ACLS   In the event of cardiac or respiratory ARREST Use medication by any route, position, wound care, and other measures to relive pain and suffering. May use oxygen, suction and manual treatment of airway obstruction as needed for comfort.   Comments RN may pronounce      09/24/17 0848    Code Status History    Date Active Date Inactive Code Status Order ID Comments User Context   09/24/2017 04:08 09/24/2017 08:46 Full Code [id number]  Saundra Shelling, MD Inpatient   11/09/2015 16:28 11/13/2015 15:14 Full Code [id number]  Baxter Hire, MD ED    Advance Directive Documentation     Most Recent Value  Type of Advance Directive  Healthcare Power of Attorney  Pre-existing out of  facility DNR order (yellow form or pink MOST form)  No data  "MOST" Form in Place?  No data      TOTAL TIME TAKING CARE OF THIS PATIENT: 45  minutes.   Note: This dictation was prepared with Dragon dictation along with smaller phrase technology.  Any transcriptional errors that result from this process are unintentional.   @M[facility] @  on 09/25/2017 at 11:28 AM  Between 7am to 6pm - Pager - [phone]  After 6pm go to [email or web address] - password [email or web address] [facility]  [facility]  Office  [phone]  CC: Primary care physician; [place] Pink, MD

## 2017-09-25 NOTE — Clinical Social Work Note (Signed)
CSW consulted for hospice residential placement. CSW spoke with Dr. Margaretmary Eddy and she mistakenly consulted CSW as patient is not going to hospice home but rather going home with hospice. Shela Leff MSW,LCSW [phone]

## 2017-09-29 ENCOUNTER — Other Ambulatory Visit: Payer: Self-pay

## 2017-09-29 NOTE — Patient Outreach (Signed)
[place] [facility]) Care Management  09/12/2017  Charles Tapia [birth year] [id number]     Transition of Care Referral  Referral Date: 09/20/2017 Referral Source: [employer] Discharge Report Date of Admission: 09/24/17 Diagnosis: acute respiratory failure, CHF Date of Discharge: 09/25/17 Facility: [place]: [facility] Medicare    Referral received. No outreach warranted at this time. Patient was discharged home with [facility] with limited and poor prognosis. [facility] does not provide services to hospice patients. Referral will be closed at this time.    Plan: RN CM will notify [facility] administrative assistant of case status.   Enzo Montgomery, RN,BSN,CCM [facility] Telephonic Care Management Coordinator Direct Phone: [phone] Toll Free: [phone] Fax: [phone]

## 2017-09-30 ENCOUNTER — Other Ambulatory Visit: Payer: Self-pay | Admitting: Pharmacist

## 2017-09-30 NOTE — Patient Outreach (Signed)
[place] [facility]) Care Management  09/30/17 RAYLEE ADAMEC Nov 28, 1917   83 year old male active in [place] registry noted to have been recently hospitalized on 09/24/2017 with acute on chronic diastolic heart failure.  PMHx includes, but not limited to, cardiomyopathy, HTN, HLD, gout, mitral regurgitation, and CVA. Patient discharged home with hospice.   [facility] pharmacy referred for 30 day post discharge medication reconciliation.   ASSESSMENT: Date Discharged from Hospital: 09/25/2017 Date Medication Reconciliation Performed: 09/30/17  Medications Discontinued at Discharge:  Allopurinol  Amlodipine  Metoprolol  Simvastatin  New Medications at Discharge:   Furosemide  Morphine  Continued Medications at Discharge:  Ensure Enlive  Acetaminophen  Polyethylene glycol  tamsulosin   Drugs sorted by system:  Cardiovascular: furosemide  Gastrointestinal:polyethylene glycol, ensure   Pain:morphine, acetaminophen  Miscellaneous: tamsulsin   Renal dosing: none noted Gaps in therapy: none noted Duplications in therapy: none noted Medications to avoid in the elderly: none noted Drug interactions: none noted Other issues noted: none noted  Patient was recently discharged from hospital and all medications have been reviewed.  I will route my note to PCP and close patient case.   Thank you for allowing [place] services to be part of this patient's care.   Ralene Bathe, PharmD, [place] [phone]

## 2017-10-03 DEATH — deceased
